# Patient Record
Sex: Male | Born: 1975 | Race: Black or African American | Hispanic: No | Marital: Single | Smoking: Former smoker
Health system: Southern US, Community
[De-identification: ages and names within clinical notes are randomized; demographics above are authoritative.]

## PROBLEM LIST (undated history)

## (undated) DIAGNOSIS — E119 Type 2 diabetes mellitus without complications: Secondary | ICD-10-CM

## (undated) DIAGNOSIS — I10 Essential (primary) hypertension: Secondary | ICD-10-CM

## (undated) DIAGNOSIS — G4730 Sleep apnea, unspecified: Secondary | ICD-10-CM

---

## 2015-08-07 ENCOUNTER — Emergency Department (HOSPITAL_COMMUNITY)
Admission: EM | Admit: 2015-08-07 | Discharge: 2015-08-07 | Disposition: A | Discharge status: Home / Self Care | Payer: BLUE CROSS/BLUE SHIELD | Attending: Emergency Medicine | Admitting: Emergency Medicine

## 2015-08-07 ENCOUNTER — Encounter (HOSPITAL_COMMUNITY): Payer: Self-pay | Admitting: Emergency Medicine

## 2015-08-07 DIAGNOSIS — K029 Dental caries, unspecified: Secondary | ICD-10-CM | POA: Insufficient documentation

## 2015-08-07 DIAGNOSIS — I10 Essential (primary) hypertension: Secondary | ICD-10-CM | POA: Insufficient documentation

## 2015-08-07 DIAGNOSIS — Z87891 Personal history of nicotine dependence: Secondary | ICD-10-CM | POA: Insufficient documentation

## 2015-08-07 DIAGNOSIS — K0889 Other specified disorders of teeth and supporting structures: Secondary | ICD-10-CM | POA: Diagnosis present

## 2015-08-07 DIAGNOSIS — K047 Periapical abscess without sinus: Secondary | ICD-10-CM | POA: Diagnosis not present

## 2015-08-07 HISTORY — DX: Essential (primary) hypertension: I10

## 2015-08-07 MED ORDER — CLINDAMYCIN HCL 300 MG PO CAPS: 300.0000 mg | ORAL_CAPSULE | Freq: Four times a day (QID) | ORAL | Status: DC

## 2015-08-07 MED ORDER — TRAMADOL HCL 50 MG PO TABS: 50.0000 mg | ORAL_TABLET | Freq: Four times a day (QID) | ORAL | Status: DC | PRN

## 2015-08-07 NOTE — ED Notes (Signed)
Pt saw dentist on Tuesday, pt was put on amoxicillin, has a plan to pull on Wednesday. Pt feels like it is not getting better

## 2015-08-07 NOTE — ED Notes (Signed)
Pt states understanding of care given and follow up instructions.  Ambulated from ED  

## 2015-08-07 NOTE — Discharge Instructions (Signed)
Dental Abscess A dental abscess is pus in or around a tooth. HOME CARE  Take medicines only as told by your dentist.  If you were prescribed antibiotic medicine, finish all of it even if you start to feel better.  Rinse your mouth (gargle) often with salt water.  Do not drive or use heavy machinery, like a lawn mower, while taking pain medicine.  Do not apply heat to the outside of your mouth.  Keep all follow-up visits as told by your dentist. This is important. GET HELP IF:  Your pain is worse, and medicine does not help. GET HELP RIGHT AWAY IF:  You have a fever or chills.  Your symptoms suddenly get worse.  You have a very bad headache.  You have problems breathing or swallowing.  You have trouble opening your mouth.  You have puffiness (swelling) in your neck or around your eye.   This information is not intended to replace advice given to you by your health care provider. Make sure you discuss any questions you have with your health care provider.   Document Released: 01/07/2015 Document Reviewed: 01/07/2015 Elsevier Interactive Patient Education 2016 [employer]  

## 2015-08-07 NOTE — ED Provider Notes (Signed)
CSN: [id number]     Arrival date & time 08/07/15  2032 History   First MD Initiated Contact with Patient 08/07/15 2046     Chief Complaint  Patient presents with  . Dental Pain     (Consider location/radiation/quality/duration/timing/severity/associated sxs/prior Treatment) HPI   James LangoMatthew Hughston is a 39 y.o. male who presents to the Emergency Department complaining of dental pain.  He states that he was seen by his dentist earlier this week and started on amoxil.  He reported continued pain and facial swelling and concerned that he has an abscess.  He reports constant pain to his left lower jaw.  Pain is worse with cold sensation and chewing.  He denies fever, chills, neck pain, difficulty swallowing.  He states that he has an appt for extraction for next week.     Past Medical History  Diagnosis Date  . Hypertension    History reviewed. No pertinent past surgical history. No family history on file. Social History  Substance Use Topics  . Smoking status: Former Games developermoker  . Smokeless tobacco: None  . Alcohol Use: No    Review of Systems  Constitutional: Negative for fever and appetite change.  HENT: Positive for dental problem and facial swelling. Negative for congestion, sore throat and trouble swallowing.   Eyes: Negative for pain and visual disturbance.  Musculoskeletal: Negative for neck pain and neck stiffness.  Neurological: Negative for dizziness, facial asymmetry and headaches.  Hematological: Negative for adenopathy.  All other systems reviewed and are negative.     Allergies  Review of patient's allergies indicates not on file.  Home Medications   Prior to Admission medications   Not on File   BP 144/75 mmHg  Pulse 95  Temp(Src) 98.1 F (36.7 C) (Oral)  Resp 15  Ht 5\' 8"  (1.727 m)  Wt 102.059 kg  BMI 34.22 kg/m2  SpO2 100% Physical Exam  Constitutional: He is oriented to person, place, and time. He appears well-developed and well-nourished. No  distress.  HENT:  Head: Normocephalic and atraumatic.  Right Ear: Tympanic membrane and ear canal normal.  Left Ear: Tympanic membrane and ear canal normal.  Mouth/Throat: Uvula is midline, oropharynx is clear and moist and mucous membranes are normal. No trismus in the jaw. Dental caries present. No dental abscesses or uvula swelling.  Mild lower left facial edema.  ttp and dental caries of the left lower first molar.  No trismus, or sublingual abnml.    Neck: Normal range of motion. Neck supple.  Cardiovascular: Normal rate, regular rhythm and normal heart sounds.   No murmur heard. Pulmonary/Chest: Effort normal and breath sounds normal. No respiratory distress.  Musculoskeletal: Normal range of motion.  Lymphadenopathy:    He has no cervical adenopathy.  Neurological: He is alert and oriented to person, place, and time. He exhibits normal muscle tone. Coordination normal.  Skin: Skin is warm and dry.  Nursing note and vitals reviewed.   ED Course  Procedures (including critical care time)   MDM   Final diagnoses:  Dental abscess    Pt has appt with a dentist next Wednesday.  Well appearing, airway patent.  No concerning sx's for Ludwig's angina. I will change pt from amoxil to clindamycin, #15 ultram for pain.  Stable for d/c    Pauline Ausammy Mars Scheaffer, PA-C 08/09/15 2049  Vanetta MuldersScott Zackowski, MD 08/13/15 [phone]

## 2015-11-11 ENCOUNTER — Emergency Department (HOSPITAL_COMMUNITY)
Admission: EM | Admit: 2015-11-11 | Discharge: 2015-11-11 | Disposition: A | Discharge status: Home / Self Care | Payer: BLUE CROSS/BLUE SHIELD | Attending: Emergency Medicine | Admitting: Emergency Medicine

## 2015-11-11 ENCOUNTER — Encounter (HOSPITAL_COMMUNITY): Payer: Self-pay | Admitting: *Deleted

## 2015-11-11 DIAGNOSIS — J0100 Acute maxillary sinusitis, unspecified: Secondary | ICD-10-CM | POA: Insufficient documentation

## 2015-11-11 DIAGNOSIS — Z79899 Other long term (current) drug therapy: Secondary | ICD-10-CM | POA: Insufficient documentation

## 2015-11-11 DIAGNOSIS — I10 Essential (primary) hypertension: Secondary | ICD-10-CM | POA: Diagnosis not present

## 2015-11-11 DIAGNOSIS — Z87891 Personal history of nicotine dependence: Secondary | ICD-10-CM | POA: Diagnosis not present

## 2015-11-11 DIAGNOSIS — R51 Headache: Secondary | ICD-10-CM | POA: Diagnosis present

## 2015-11-11 MED ORDER — AZITHROMYCIN 250 MG PO TABS: 250.0000 mg | ORAL_TABLET | Freq: Every day | ORAL | Status: DC

## 2015-11-11 MED ORDER — TRAMADOL HCL 50 MG PO TABS: 50.0000 mg | ORAL_TABLET | Freq: Four times a day (QID) | ORAL | Status: DC | PRN

## 2015-11-11 MED ORDER — PREDNISONE 20 MG PO TABS: 60.0000 mg | ORAL_TABLET | Freq: Every day | ORAL | Status: DC

## 2015-11-11 NOTE — ED Notes (Signed)
Pt c/o headache and right ear pain x 2 weeks

## 2015-11-11 NOTE — Discharge Instructions (Signed)

## 2015-11-11 NOTE — ED Provider Notes (Addendum)
CSN: [id number]     Arrival date & time 11/11/15  0406 History   None    No chief complaint on file.    (Consider location/radiation/quality/duration/timing/severity/associated sxs/prior Treatment) HPI Comments: Patient presents to the emergency department for evaluation of facial pain and sinus pain. Patient reports symptoms have been ongoing for 2 weeks. He has been taking over-the-counter medication without improvement. He does report that he has had problems with his sinuses in the past. He has had significant nasal congestion and intermittent bleeding of his nose. Pain is all on the right side of his face, has not identified any toothaches or dental problems.   Past Medical History  Diagnosis Date  . Hypertension    No past surgical history on file. No family history on file. Social History  Substance Use Topics  . Smoking status: Former Games developermoker  . Smokeless tobacco: Not on file  . Alcohol Use: No    Review of Systems  HENT: Positive for congestion.   All other systems reviewed and are negative.     Allergies  Review of patient's allergies indicates not on file.  Home Medications   Prior to Admission medications   Medication Sig Start Date End Date Taking? Authorizing Provider  clindamycin (CLEOCIN) 300 MG capsule Take 1 capsule (300 mg total) by mouth 4 (four) times daily. For 7 days 08/07/15   Tammy Triplett, PA-C  traMADol (ULTRAM) 50 MG tablet Take 1 tablet (50 mg total) by mouth every 6 (six) hours as needed. 08/07/15   Tammy Triplett, PA-C   There were no vitals taken for this visit. Physical Exam  Constitutional: He is oriented to person, place, and time. He appears well-developed and well-nourished. No distress.  HENT:  Head: Normocephalic and atraumatic.  Right Ear: Hearing, tympanic membrane and external ear normal.  Left Ear: Hearing normal.  Nose: Mucosal edema present. Right sinus exhibits maxillary sinus tenderness.  Mouth/Throat: Oropharynx is clear  and moist and mucous membranes are normal. No dental abscesses or dental caries. No oropharyngeal exudate, posterior oropharyngeal erythema or tonsillar abscesses.  Eyes: Conjunctivae and EOM are normal. Pupils are equal, round, and reactive to light.  Neck: Normal range of motion. Neck supple.  Cardiovascular: Regular rhythm, S1 normal and S2 normal.  Exam reveals no gallop and no friction rub.   No murmur heard. Pulmonary/Chest: Effort normal and breath sounds normal. No respiratory distress. He exhibits no tenderness.  Abdominal: Soft. Normal appearance and bowel sounds are normal. There is no hepatosplenomegaly. There is no tenderness. There is no rebound, no guarding, no tenderness at McBurney's point and negative Murphy's sign. No hernia.  Musculoskeletal: Normal range of motion.  Neurological: He is alert and oriented to person, place, and time. He has normal strength. No cranial nerve deficit or sensory deficit. Coordination normal. GCS eye subscore is 4. GCS verbal subscore is 5. GCS motor subscore is 6.  Skin: Skin is warm, dry and intact. No rash noted. No cyanosis.  Psychiatric: He has a normal mood and affect. His speech is normal and behavior is normal. Thought content normal.  Nursing note and vitals reviewed.   ED Course  Procedures (including critical care time) Labs Review Labs Reviewed - No data to display  Imaging Review No results found. I have personally reviewed and evaluated these images and lab results as part of my medical decision-making.   EKG Interpretation None      MDM   Final diagnoses:  None  Sinusitis  Gilda Crease, MD 11/11/15 4098  Gilda Crease, MD 11/11/15 [phone]

## 2015-11-11 NOTE — ED Notes (Signed)
Pt states understanding of care given and follow up instructions.  Ambulated from ED  

## 2020-09-25 ENCOUNTER — Other Ambulatory Visit: Payer: Self-pay

## 2020-09-25 ENCOUNTER — Encounter (HOSPITAL_COMMUNITY): Payer: Self-pay | Admitting: *Deleted

## 2020-09-25 ENCOUNTER — Emergency Department (HOSPITAL_COMMUNITY)
Admission: EM | Admit: 2020-09-25 | Discharge: 2020-09-25 | Disposition: A | Discharge status: Home / Self Care | Payer: BLUE CROSS/BLUE SHIELD | Attending: Emergency Medicine | Admitting: Emergency Medicine

## 2020-09-25 DIAGNOSIS — Z5321 Procedure and treatment not carried out due to patient leaving prior to being seen by health care provider: Secondary | ICD-10-CM | POA: Insufficient documentation

## 2020-09-25 DIAGNOSIS — R251 Tremor, unspecified: Secondary | ICD-10-CM | POA: Insufficient documentation

## 2020-09-25 DIAGNOSIS — K5900 Constipation, unspecified: Secondary | ICD-10-CM | POA: Insufficient documentation

## 2020-09-25 DIAGNOSIS — R059 Cough, unspecified: Secondary | ICD-10-CM | POA: Insufficient documentation

## 2020-09-25 NOTE — ED Notes (Signed)
Left at 1605 prior to treament

## 2020-09-25 NOTE — ED Triage Notes (Signed)
covid exposure, cough, constipation, tremor for months

## 2021-02-17 ENCOUNTER — Encounter (HOSPITAL_COMMUNITY): Payer: Self-pay

## 2021-02-17 ENCOUNTER — Other Ambulatory Visit: Payer: Self-pay

## 2021-02-17 ENCOUNTER — Emergency Department (HOSPITAL_COMMUNITY): Payer: Self-pay

## 2021-02-17 ENCOUNTER — Emergency Department (HOSPITAL_COMMUNITY)
Admission: EM | Admit: 2021-02-17 | Discharge: 2021-02-17 | Disposition: A | Discharge status: Home / Self Care | Payer: Self-pay | Attending: Emergency Medicine | Admitting: Emergency Medicine

## 2021-02-17 DIAGNOSIS — Z87891 Personal history of nicotine dependence: Secondary | ICD-10-CM | POA: Insufficient documentation

## 2021-02-17 DIAGNOSIS — I10 Essential (primary) hypertension: Secondary | ICD-10-CM | POA: Insufficient documentation

## 2021-02-17 DIAGNOSIS — M79604 Pain in right leg: Secondary | ICD-10-CM | POA: Insufficient documentation

## 2021-02-17 DIAGNOSIS — Z79899 Other long term (current) drug therapy: Secondary | ICD-10-CM | POA: Insufficient documentation

## 2021-02-17 DIAGNOSIS — G589 Mononeuropathy, unspecified: Secondary | ICD-10-CM | POA: Insufficient documentation

## 2021-02-17 DIAGNOSIS — G588 Other specified mononeuropathies: Secondary | ICD-10-CM

## 2021-02-17 LAB — CBC WITH DIFFERENTIAL/PLATELET
Abs Immature Granulocytes: 0.01 10*3/uL (ref 0.00–0.07)
Basophils Absolute: 0 10*3/uL (ref 0.0–0.1)
Basophils Relative: 1 %
Eosinophils Absolute: 0.3 10*3/uL (ref 0.0–0.5)
Eosinophils Relative: 9 %
HCT: 39.1 % (ref 39.0–52.0)
Hemoglobin: 12.8 g/dL — ABNORMAL LOW (ref 13.0–17.0)
Immature Granulocytes: 0 %
Lymphocytes Relative: 23 %
Lymphs Abs: 0.9 10*3/uL (ref 0.7–4.0)
MCH: 29.6 pg (ref 26.0–34.0)
MCHC: 32.7 g/dL (ref 30.0–36.0)
MCV: 90.3 fL (ref 80.0–100.0)
Monocytes Absolute: 0.4 10*3/uL (ref 0.1–1.0)
Monocytes Relative: 10 %
Neutro Abs: 2.3 10*3/uL (ref 1.7–7.7)
Neutrophils Relative %: 57 %
Platelets: 218 10*3/uL (ref 150–400)
RBC: 4.33 MIL/uL (ref 4.22–5.81)
RDW: 13.5 % (ref 11.5–15.5)
WBC: 3.9 10*3/uL — ABNORMAL LOW (ref 4.0–10.5)
nRBC: 0 % (ref 0.0–0.2)

## 2021-02-17 LAB — COMPREHENSIVE METABOLIC PANEL
ALT: 13 U/L (ref 0–44)
AST: 15 U/L (ref 15–41)
Albumin: 3.6 g/dL (ref 3.5–5.0)
Alkaline Phosphatase: 86 U/L (ref 38–126)
Anion gap: 6 (ref 5–15)
BUN: 17 mg/dL (ref 6–20)
CO2: 28 mmol/L (ref 22–32)
Calcium: 8.8 mg/dL — ABNORMAL LOW (ref 8.9–10.3)
Chloride: 106 mmol/L (ref 98–111)
Creatinine, Ser: 0.95 mg/dL (ref 0.61–1.24)
GFR, Estimated: >60 mL/min (ref >60)
Glucose, Bld: 100 mg/dL — ABNORMAL HIGH (ref 70–99)
Potassium: 3.5 mmol/L (ref 3.5–5.1)
Sodium: 140 mmol/L (ref 135–145)
Total Bilirubin: 0.9 mg/dL (ref 0.3–1.2)
Total Protein: 7.2 g/dL (ref 6.5–8.1)

## 2021-02-17 IMAGING — DX DG KNEE COMPLETE 4+V*R*
4 series · 4 of 4 positions shown · non-contrast
Comparison: None.

CLINICAL DATA: Right knee pain.  No known injury.

EXAM:
RIGHT KNEE - COMPLETE 4+ VIEW

[knee ap]
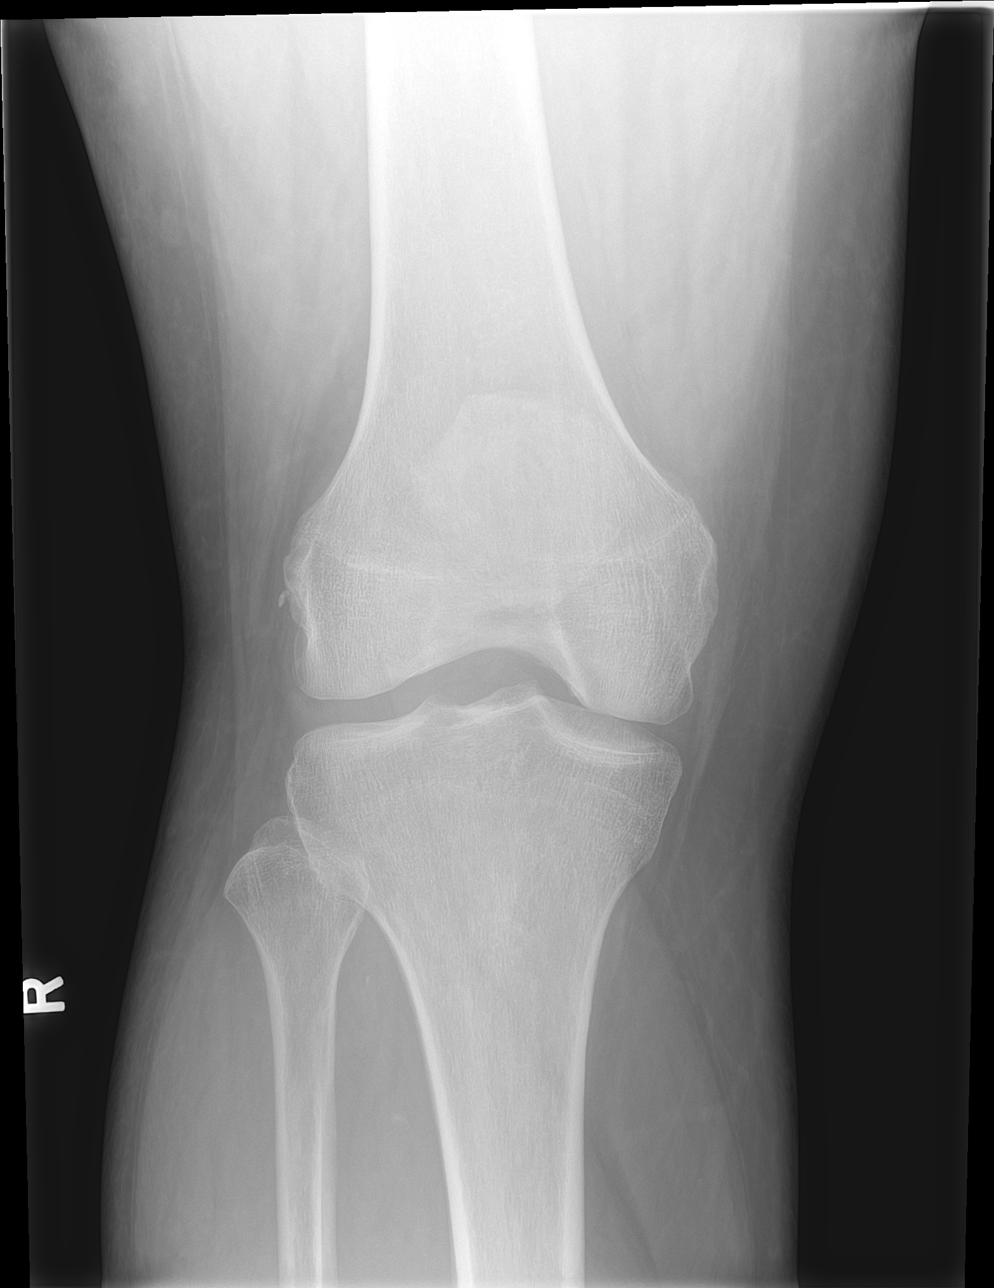

[knee lat]
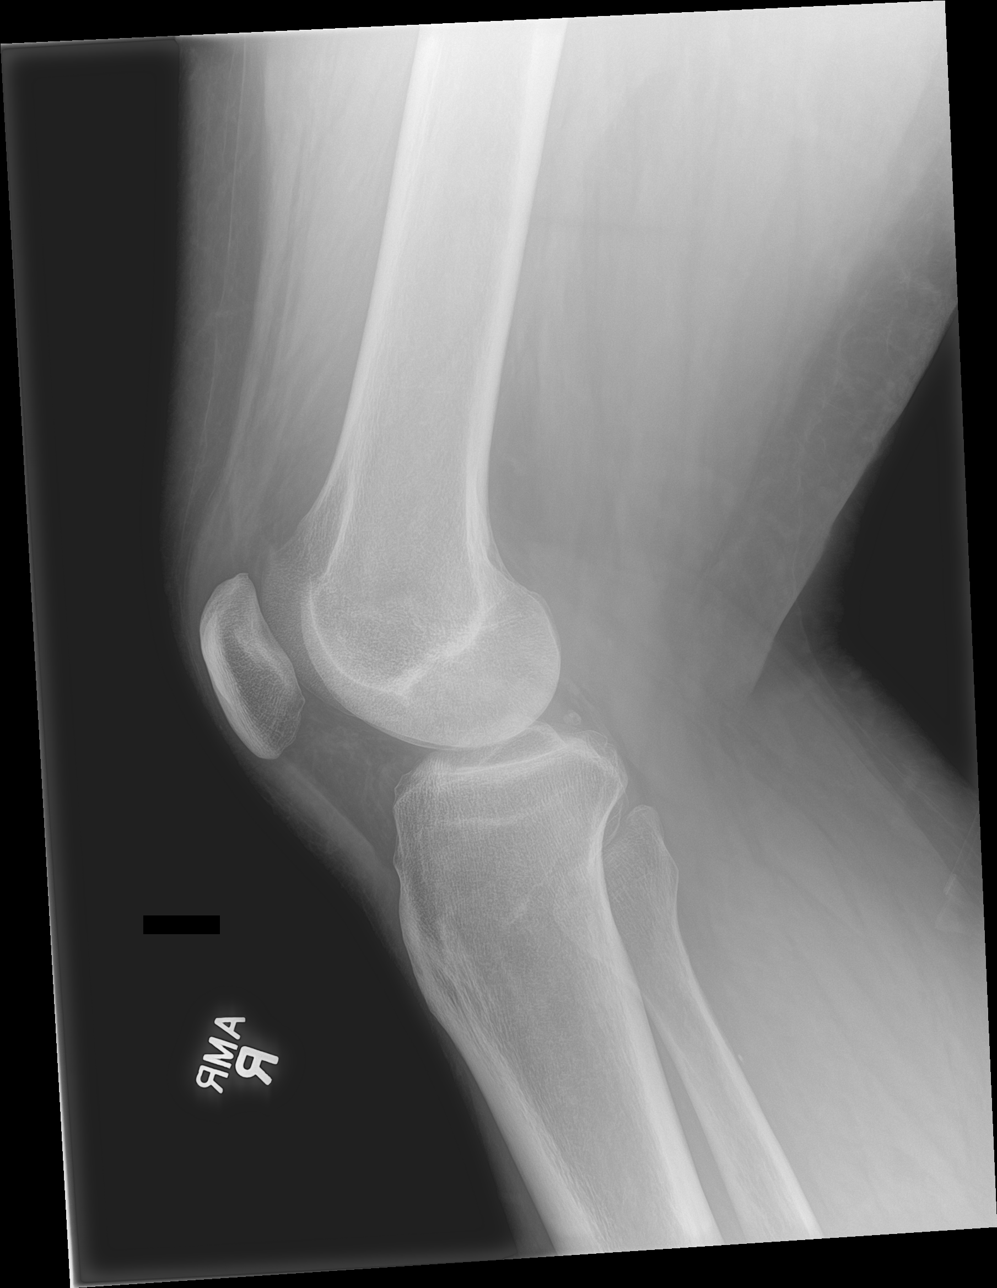

[knee obl (1 of 2)]
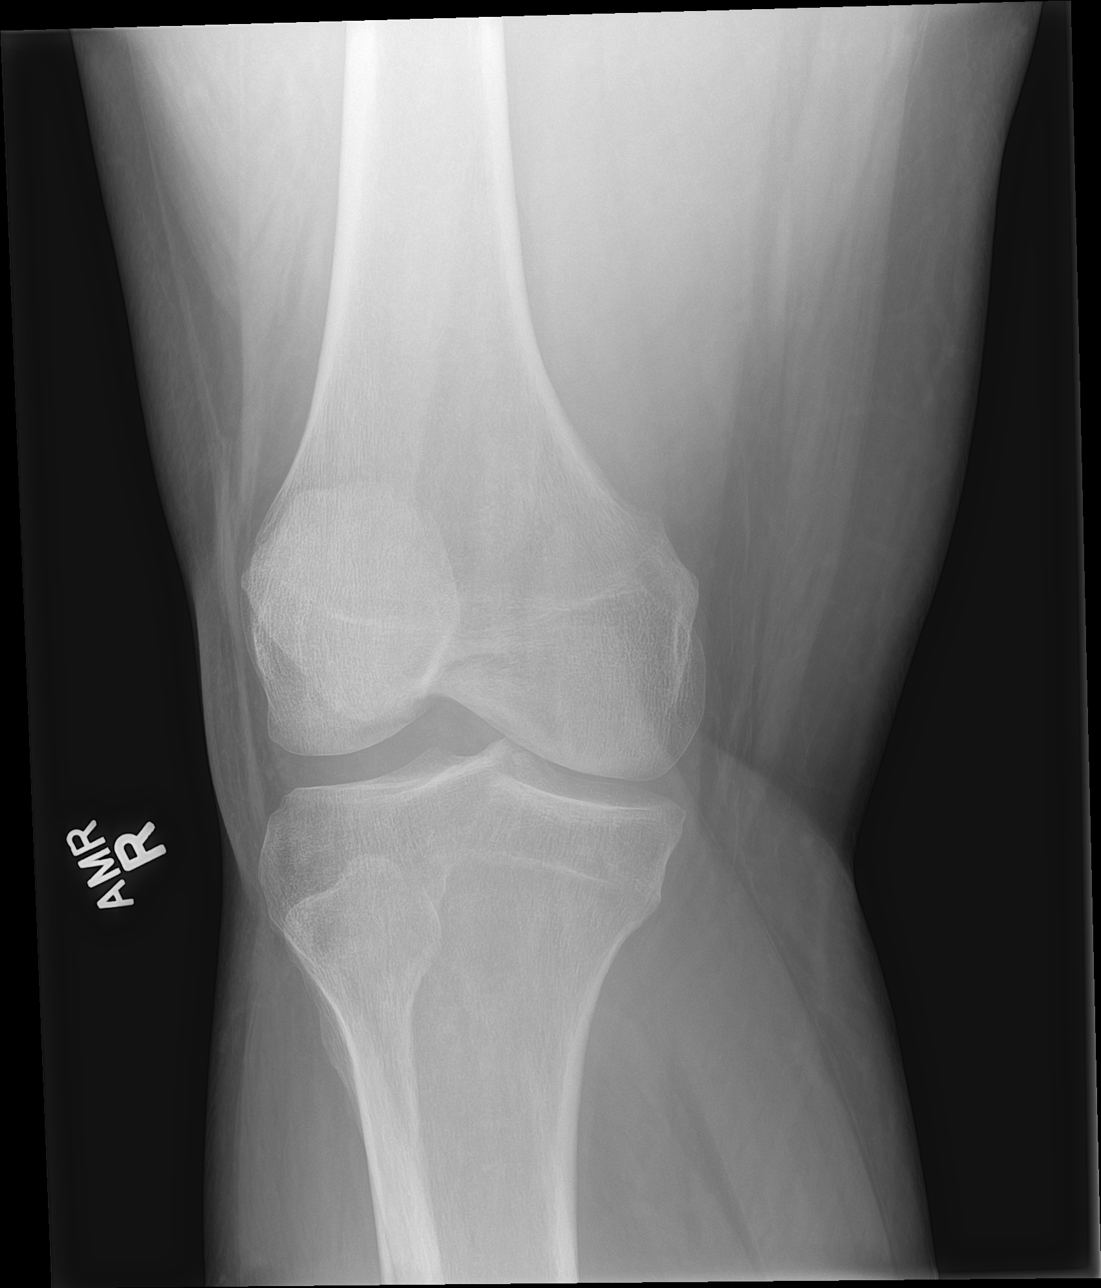

[knee obl (2 of 2)]
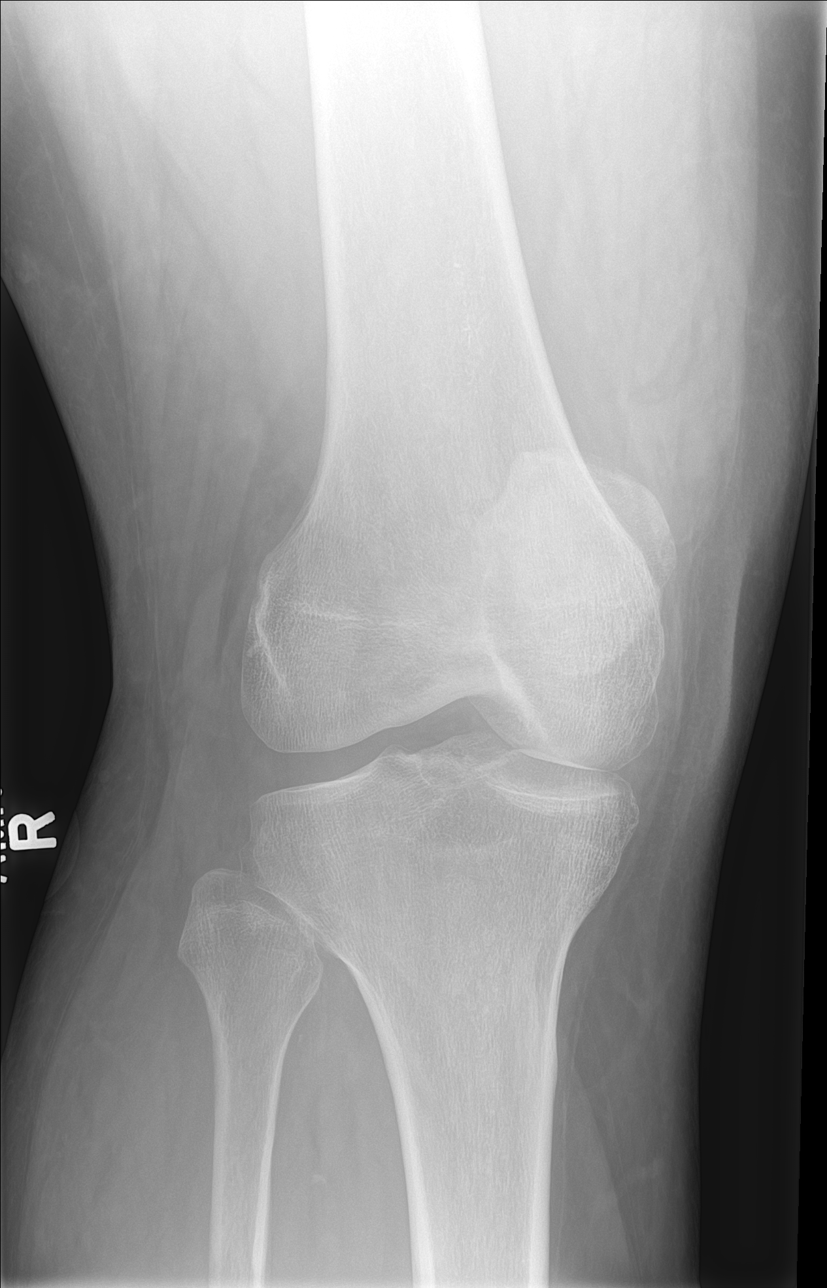

[4 of 4 positions shown; findings below may reference images not displayed]

FINDINGS: No evidence of fracture, dislocation, or joint effusion. Joint
spaces are maintained. No evidence of arthropathy or other focal
bone abnormality. Soft tissues are unremarkable.
IMPRESSION: Negative.

## 2021-02-17 MED ORDER — AMLODIPINE BESYLATE 10 MG PO TABS: 10.0000 mg | ORAL_TABLET | Freq: Every day | ORAL | 0 refills | Status: DC

## 2021-02-17 MED ORDER — ACETAMINOPHEN 500 MG PO TABS
1000.0000 mg | ORAL_TABLET | Freq: Once | ORAL | Status: AC
  Administered 2021-02-17: 1000 mg via ORAL
  Filled 2021-02-17: qty 2

## 2021-02-17 NOTE — Discharge Instructions (Addendum)
Start taking blood pressure medications and follow-up closely with primary doctor. Discuss outpatient MRI if your symptoms continue.  Return for incontinence, weakness in 1 or both of your legs, loss of sensation around your rectal and testicle area, fevers or new concerns. Take Tylenol every 4 hours as needed for pain.

## 2021-02-17 NOTE — ED Provider Notes (Signed)
[facility] EMERGENCY DEPARTMENT Provider Note   CSN: [id number] Arrival date & time: 02/17/21  1008     History Chief Complaint  Patient presents with   Leg Pain    James Hartman is a 45 y.o. male.  Patient presents with right leg pain for 3 weeks.  Patient is also had uncontrolled blood pressure, used to be on blood pressure medications however no longer takes.  Patient does have a primary doctor said he could not get in.  No fevers or chills.  No injuries recalled.  Intermittent pain from anterior right thigh down into foot.  No shortness of breath or chest pain.  Component worse with movement and walking.  No back pain with it.  No IV drug use history of back surgery history.  No incontinence or urinary changes, patient normally has small amounts of urination.  No weakness in the legs.      Past Medical History:  Diagnosis Date   Hypertension     There are no problems to display for this patient.   History reviewed. No pertinent surgical history.     No family history on file.  Social History   Tobacco Use   Smoking status: Former    Pack years: 0.00   Smokeless tobacco: Never  Substance Use Topics   Alcohol use: Not Currently   Drug use: No    Home Medications Prior to Admission medications   Medication Sig Start Date End Date Taking? Authorizing Provider  amLODipine (NORVASC) 10 MG tablet Take 1 tablet (10 mg total) by mouth daily. 02/17/21  Yes Blane Ohara, MD  ibuprofen (ADVIL) 200 MG tablet Take 400 mg by mouth every 6 (six) hours as needed for mild pain.   Yes [provider]  azithromycin (ZITHROMAX) 250 MG tablet Take 1 tablet (250 mg total) by mouth daily. Take first 2 tablets together, then 1 every day until finished. Patient not taking: Reported on 02/17/2021 11/11/15   Gilda Crease, MD  predniSONE (DELTASONE) 20 MG tablet Take 3 tablets (60 mg total) by mouth daily with breakfast. Patient not taking: Reported on 02/17/2021  11/11/15   Gilda Crease, MD  traMADol (ULTRAM) 50 MG tablet Take 1 tablet (50 mg total) by mouth every 6 (six) hours as needed. Patient not taking: Reported on 02/17/2021 11/11/15   Gilda Crease, MD    Allergies    Patient has no known allergies.  Review of Systems   Review of Systems  Constitutional:  Negative for chills and fever.  HENT:  Negative for congestion.   Eyes:  Negative for visual disturbance.  Respiratory:  Negative for shortness of breath.   Cardiovascular:  Negative for chest pain and leg swelling.  Gastrointestinal:  Negative for abdominal pain and vomiting.  Genitourinary:  Positive for difficulty urinating. Negative for dysuria and flank pain.  Musculoskeletal:  Negative for back pain, neck pain and neck stiffness.  Skin:  Negative for rash.  Neurological:  Negative for weakness, light-headedness and headaches.   Physical Exam Updated Vital Signs BP (!) 148/92   Pulse 69   Temp (!) 97.4 F (36.3 C) (Oral)   Resp 16   Ht 5\' 7"  (1.702 m)   Wt 127 kg   SpO2 99%   BMI 43.85 kg/m   Physical Exam Vitals and nursing note reviewed.  Constitutional:      General: He is not in acute distress.    Appearance: He is well-developed.  HENT:  Head: Normocephalic and atraumatic.     Mouth/Throat:     Mouth: Mucous membranes are dry.  Eyes:     General:        Right eye: No discharge.        Left eye: No discharge.     Conjunctiva/sclera: Conjunctivae normal.  Neck:     Trachea: No tracheal deviation.  Cardiovascular:     Rate and Rhythm: Normal rate and regular rhythm.     Heart sounds: No murmur heard. Pulmonary:     Effort: Pulmonary effort is normal.     Breath sounds: Normal breath sounds.  Abdominal:     General: There is no distension.     Palpations: Abdomen is soft.     Tenderness: There is no abdominal tenderness. There is no guarding.  Musculoskeletal:        General: No swelling or tenderness.     Cervical back: Normal range  of motion and neck supple.     Comments: Patient points to location of pain anterior quadricep region on the right extending leg.  On exam patient has normal sensation in all major nerves to palpation bilateral lower extremity.  2+ pulses in distal extremities bilateral.  No swelling to the legs.  5+ strength flexion extension of major joints and lower extremities.  No back tenderness on palpation lumbar region.  Skin:    General: Skin is warm.     Capillary Refill: Capillary refill takes less than 2 seconds.     Findings: No rash.  Neurological:     General: No focal deficit present.     Mental Status: He is alert and oriented to person, place, and time.     Cranial Nerves: No cranial nerve deficit.     Motor: No weakness.  Psychiatric:        Mood and Affect: Mood normal.    ED Results / Procedures / Treatments   Labs (all labs ordered are listed, but only abnormal results are displayed) Labs Reviewed  CBC WITH DIFFERENTIAL/PLATELET - Abnormal; Notable for the following components:      Result Value   WBC 3.9 (*)    Hemoglobin 12.8 (*)    All other components within normal limits  COMPREHENSIVE METABOLIC PANEL - Abnormal; Notable for the following components:   Glucose, Bld 100 (*)    Calcium 8.8 (*)    All other components within normal limits  URINALYSIS, ROUTINE W REFLEX MICROSCOPIC    EKG None  Radiology DG Knee Complete 4 Views Right  Result Date: 02/17/2021 CLINICAL DATA:  Right knee pain.  No known injury. EXAM: RIGHT KNEE - COMPLETE 4+ VIEW COMPARISON:  None. FINDINGS: No evidence of fracture, dislocation, or joint effusion. Joint spaces are maintained. No evidence of arthropathy or other focal bone abnormality. Soft tissues are unremarkable. IMPRESSION: Negative. Electronically Signed   By: Duanne Guess D.O.   On: 02/17/2021 11:01    Procedures Procedures   Medications Ordered in ED Medications  acetaminophen (TYLENOL) tablet 1,000 mg (1,000 mg Oral Given  02/17/21 1055)    ED Course  I have reviewed the triage vital signs and the nursing notes.  Pertinent labs & imaging results that were available during my care of the patient were reviewed by me and considered in my medical decision making (see chart for details).    MDM Rules/Calculators/A&P  Patient presents with multiple concerns for the past 3 weeks.  Recommended the importance of following up with primary doctor to ensure his signs and symptoms improve, blood pressure improved as significant risks with uncontrolled blood pressure.  Strongly recommend starting back on his medication will give prescription for 30 days.  Patient has no isolated neurodeficits, no signs of cauda equina at this time.  Patient stable for outpatient MRI.  X-ray of the right knee area reviewed no acute fracture findings.  Tylenol given for pain.  Blood pressure improved in the ER and prescription given for home.  General blood work ordered and reviewed showing majority of electrolytes unremarkable mild low calcium 8.8, no significant anemia, no elevated white blood cell count, normal kidney function.  Patient stable for continued outpatient follow-up.  Final Clinical Impression(s) / ED Diagnoses Final diagnoses:  Right leg pain  Other mononeuropathy  Primary hypertension    Rx / DC Orders ED Discharge Orders          Ordered    amLODipine (NORVASC) 10 MG tablet  Daily        02/17/21 1333             Blane Ohara, MD 02/17/21 1337

## 2021-02-17 NOTE — ED Triage Notes (Signed)
Pt c/o right leg ,thigh pain  down to his foot for a few weeks.

## 2021-02-23 ENCOUNTER — Other Ambulatory Visit: Payer: Self-pay

## 2021-02-23 ENCOUNTER — Encounter: Payer: Self-pay | Admitting: Neurology

## 2021-02-23 DIAGNOSIS — R202 Paresthesia of skin: Secondary | ICD-10-CM

## 2021-04-07 ENCOUNTER — Other Ambulatory Visit: Payer: Self-pay

## 2021-04-07 ENCOUNTER — Ambulatory Visit (INDEPENDENT_AMBULATORY_CARE_PROVIDER_SITE_OTHER): Payer: Self-pay | Admitting: Neurology

## 2021-04-07 DIAGNOSIS — M5417 Radiculopathy, lumbosacral region: Secondary | ICD-10-CM

## 2021-04-07 DIAGNOSIS — R202 Paresthesia of skin: Secondary | ICD-10-CM

## 2021-04-07 NOTE — Procedures (Signed)
[facility]  [address] [place], [address]  [place], [place] [address] Tel: [phone] Fax:  [phone] Test Date:  04/07/2021  Patient: James Hartman, James Hartman. DOB: 1976-02-27 Physician: Nita Sickle, DO  Sex: Male Height: 5\' 7"  Ref Phys: , MD  ID#: [id number]   Technician:    Patient Complaints: This is a 45 year old man referred for evaluation of right leg pain and numbness  NCV & EMG Findings: Extensive electrodiagnostic testing of the right lower extremity shows:  Right sural and superficial peroneal sensory responses are within normal limits. Right peroneal and tibial motor responses are within normal limits. Chronic motor axonal loss changes are seen affecting the L5 myotome on the right, without accompanied active denervation.  Impression: Chronic L5 radiculopathy affecting the right lower extremity, mild. There is no evidence of a large fiber sensorimotor polyneuropathy affecting the right lower extremity.   ___________________________ 55, DO    Nerve Conduction Studies Anti Sensory Summary Table   Stim Site NR Peak (ms) Norm Peak (ms) P-T Amp (V) Norm P-T Amp  Right Sup Peroneal Anti Sensory (Ant Lat Mall)  32C  12 cm    2.6 <4.5 14.8 >5  Right Sural Anti Sensory (Lat Mall)  32C  Calf    2.7 <4.5 15.3 >5   Motor Summary Table   Stim Site NR Onset (ms) Norm Onset (ms) O-P Amp (mV) Norm O-P Amp Site1 Site2 Delta-0 (ms) Dist (cm) Vel (m/s) Norm Vel (m/s)  Right Peroneal Motor (Ext Dig Brev)  32C  Ankle    4.0 <5.5 6.7 >3 B Fib Ankle 9.8 41.0 42 >40  B Fib    13.8  6.1  Poplt B Fib 1.6 9.0 56 >40  Poplt    15.4  6.0         Right Tibial Motor (Abd Hall Brev)  32C  Ankle    5.9 <6.0 8.2 >8 Knee Ankle 10.3 42.0 41 >40  Knee    16.2  5.8          EMG   Side Muscle Ins Act Fibs Psw Fasc Number Recrt Dur Dur. Amp Amp. Poly Poly. Comment  Right AntTibialis Nml Nml Nml Nml 1- Rapid Few 1+ Few 1+ Nml Nml N/A  Right Gastroc  Nml Nml Nml Nml Nml Nml Nml Nml Nml Nml Nml Nml N/A  Right Flex Dig Long Nml Nml Nml Nml 1- Rapid Few 1+ Few 1+ Nml Nml N/A  Right RectFemoris Nml Nml Nml Nml Nml Nml Nml Nml Nml Nml Nml Nml N/A  Right GluteusMed Nml Nml Nml Nml 1- Rapid Few 1+ Few 1+ Nml Nml N/A      Waveforms:

## 2021-05-25 ENCOUNTER — Ambulatory Visit (HOSPITAL_COMMUNITY): Payer: Medicaid Other | Attending: Family Medicine

## 2021-05-25 ENCOUNTER — Other Ambulatory Visit: Payer: Self-pay

## 2021-05-25 DIAGNOSIS — M6281 Muscle weakness (generalized): Secondary | ICD-10-CM | POA: Diagnosis present

## 2021-05-25 DIAGNOSIS — R262 Difficulty in walking, not elsewhere classified: Secondary | ICD-10-CM

## 2021-05-25 DIAGNOSIS — R29898 Other symptoms and signs involving the musculoskeletal system: Secondary | ICD-10-CM
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
## 2021-06-09 ENCOUNTER — Ambulatory Visit (HOSPITAL_COMMUNITY): Payer: Medicaid Other | Attending: Family Medicine

## 2021-06-09 ENCOUNTER — Telehealth (HOSPITAL_COMMUNITY): Payer: Self-pay

## 2021-06-09 DIAGNOSIS — M6281 Muscle weakness (generalized): Secondary | ICD-10-CM | POA: Insufficient documentation

## 2021-06-09 DIAGNOSIS — R29898 Other symptoms and signs involving the musculoskeletal system: Secondary | ICD-10-CM | POA: Insufficient documentation

## 2021-06-09 DIAGNOSIS — R262 Difficulty in walking, not elsewhere classified: Secondary | ICD-10-CM | POA: Insufficient documentation

## 2021-06-09 NOTE — Telephone Encounter (Signed)
No show #1.  Attempted to call with inability to reach or leave message.  Letter placed in mail for reminder of next apt and contact number if needs to cancel/rescheduled future apts.  Becky Sax, LPTA/CLT; [place] [phone]

## 2021-06-16 ENCOUNTER — Ambulatory Visit (HOSPITAL_COMMUNITY): Payer: Medicaid Other

## 2021-06-16 ENCOUNTER — Other Ambulatory Visit: Payer: Self-pay

## 2021-06-16 DIAGNOSIS — R29898 Other symptoms and signs involving the musculoskeletal system: Secondary | ICD-10-CM

## 2021-06-16 DIAGNOSIS — M6281 Muscle weakness (generalized): Secondary | ICD-10-CM | POA: Diagnosis present

## 2021-06-16 DIAGNOSIS — R262 Difficulty in walking, not elsewhere classified: Secondary | ICD-10-CM

## 2021-06-16 NOTE — Therapy (Signed)
[place], [place], [address] Phone: [phone]   Fax:  [phone]  Physical Therapy Treatment and Discharge Summary  Patient Details  Name: James Hartman MRN: [id number] Date of Birth: May 09, 1976 Referring Provider (PT): Curtis Sites MD  PHYSICAL THERAPY DISCHARGE SUMMARY  Visits from Start of Care: 2  Current functional level related to goals / functional outcomes: No change in symptoms, reports sensory disturbance surrounding his genitals, anus, anterior BLE, to medial feet   Remaining deficits: See assessment   Education / Equipment: Advised to seek MD assessment/diagnositics   Patient agrees to discharge. Patient goals were not met. Patient is being discharged due to a change in medical status.  Encounter Date: 06/16/2021   PT End of Session - 06/16/21 0944     Visit Number 2    Number of Visits 4    Authorization Type [place] Medicaid [employer], 27 VL, no auth for over 50 yo    Authorization - Visit Number 2    Authorization - Number of Visits 27    PT Start Time 0945    PT Stop Time 1020    PT Time Calculation (min) 35 min    Activity Tolerance Patient tolerated treatment well    Behavior During Therapy WFL for tasks assessed/performed             Past Medical History:  Diagnosis Date   Hypertension     No past surgical history on file.  There were no vitals filed for this visit.   Subjective Assessment - 06/16/21 0947     Subjective Pt notes no change in his symptoms and reports MD has increased doseage of gabapentin. Pt reports that he has been experiencing saddle anesthesia and notes urinary urgency but denies loss of bowel control.  Pt reports numbess extending from medial side of his feet extending along anterior thighs to groin. Pt reports issue of erectile dysfunction and again notes numbess surrounding his genitals and anus. Pt denies any serious back discomfort  and reports symptoms do not change based on activity or position    Diagnostic tests Nerve conduction test on RLE    Currently in Pain? Yes    Pain Score 4     Pain Location Leg    Pain Orientation Right                [facility] PT Assessment - 06/16/21 0001       Assessment   Medical Diagnosis Radiculopathy Of Leg    Referring Provider (PT) Curtis Sites MD      Strength   Strength Assessment Site Knee;Ankle    Right/Left Knee Right    Right Knee Flexion 4/5    Right Knee Extension 4/5    Right/Left Ankle Right    Right Ankle Dorsiflexion 4/5    Right Ankle Plantar Flexion 5/5    Right Ankle Inversion 5/5    Right Ankle Eversion 5/5      Special Tests    Special Tests Lumbar    Lumbar Tests Straight Leg Raise;Prone Knee Bend Test;Slump Test      Slump test   Findings Negative      Prone Knee Bend Test   Findings Negative      Straight Leg Raise   Findings Negative                           [facility] Adult PT Treatment/Exercise -  06/16/21 0001       Exercises   Exercises Lumbar      Lumbar Exercises: Stretches   Prone on Elbows Stretch 5 reps;60 seconds                     PT Education - 06/16/21 1013     Education Details discussion with pt regarding his present condition and report of symptoms.  This therapist recommends to patient that he seek further medical diagnostics and assessment given his current presentation and report of sensory disturbance surrounding his genitals    Person(s) Educated Patient    Methods Explanation    Comprehension Verbalized understanding              PT Short Term Goals - 06/16/21 1019       PT SHORT TERM GOAL #1   Title Patient will be independent with HEP in order to improve functional outcomes.    Time 2    Period Weeks    Status Deferred    Target Date 06/08/21      PT SHORT TERM GOAL #2   Title Patient will report at least 25% improvement in symptoms for improved quality of  life.    Baseline 3/10 pain in BLE at rest    Time 2    Period Weeks    Status Deferred    Target Date 06/08/21               PT Long Term Goals - 06/16/21 1019       PT LONG TERM GOAL #1   Title Patient will be able to ambulate at least 250 feet in 2MWT in order to demonstrate improved gait speed for community ambulation.    Baseline 200 ft with SPC    Time 4    Period Weeks    Status Deferred      PT LONG TERM GOAL #2   Title Patient will be able to navigate stairs with reciprocal pattern without compensation in order to demonstrate improved LE strength.    Baseline step-to, bilateral HR    Time 4    Period Weeks    Status Deferred                   Plan - 06/16/21 1016     Clinical Impression Statement No change in symptoms with body mechanics, position, time of day, or activity level.  Pt notes numbess along his genitals, anus, anterior thighs, extending to medial feet bilaterally.  No change in symptoms with flexion or extension bias of spine and denies any serious back pain other than discomfort to palpation around lumbosacral region.  Slight weakness appreciated in RLE vs LLE. Unable to elicit Achilles reflex bilaterally.  Given his report of sensory disturbance and report of genitourinary issues I have asked him to follow-up with referring MD for further assessment    Personal Factors and Comorbidities Time since onset of injury/illness/exacerbation    Examination-Activity Limitations Bend;Carry;Lift;Stand;Stairs;Locomotion Level    Examination-Participation Restrictions Church;Cleaning;Yard Work;Occupation    Stability/Clinical Decision Making Stable/Uncomplicated    Rehab Potential Fair    PT Frequency 1x / week    PT Duration 4 weeks    PT Treatment/Interventions Electrical Stimulation;Traction;Moist Heat;Gait training;Stair training;Functional mobility training;Therapeutic activities;Therapeutic exercise;Balance training;Patient/family education;Dry  needling;Neuromuscular re-education;Energy conservation;Manual techniques;Taping;Spinal Manipulations;Joint Manipulations    PT Next Visit Plan extension vs flexion bias    PT Home Exercise Plan prone, prone on elbows, prone press-ups  Consulted and Agree with Plan of Care Patient             Patient will benefit from skilled therapeutic intervention in order to improve the following deficits and impairments:  Abnormal gait, Decreased activity tolerance, Decreased balance, Decreased strength, Difficulty walking, Impaired sensation, Postural dysfunction, Pain  Visit Diagnosis: Difficulty in walking, not elsewhere classified  Other symptoms and signs involving the musculoskeletal system  Muscle weakness (generalized)     Problem List There are no problems to display for this patient.   Toniann Fail, PT 06/16/2021, 10:22 AM  [place] [address] [place], [place], [address] Phone: [phone]   Fax:  [phone]  Name: James Hartman MRN: [id number] Date of Birth: [birth year]

## 2021-06-23 ENCOUNTER — Encounter (HOSPITAL_COMMUNITY): Payer: Medicaid Other

## 2021-06-30 ENCOUNTER — Encounter (HOSPITAL_COMMUNITY): Payer: Medicaid Other

## 2021-09-17 ENCOUNTER — Other Ambulatory Visit (HOSPITAL_COMMUNITY): Payer: Self-pay | Admitting: Family Medicine

## 2021-09-17 ENCOUNTER — Other Ambulatory Visit: Payer: Self-pay | Admitting: Family Medicine

## 2021-09-17 DIAGNOSIS — R200 Anesthesia of skin: Secondary | ICD-10-CM

## 2021-09-29 ENCOUNTER — Other Ambulatory Visit: Payer: Self-pay

## 2021-09-29 ENCOUNTER — Ambulatory Visit (HOSPITAL_COMMUNITY)
Admission: RE | Admit: 2021-09-29 | Discharge: 2021-09-29 | Disposition: A | Discharge status: Home / Self Care | Payer: Medicaid Other | Source: Ambulatory Visit | Attending: Family Medicine | Admitting: Family Medicine

## 2021-09-29 DIAGNOSIS — R200 Anesthesia of skin: Secondary | ICD-10-CM | POA: Diagnosis present

## 2021-09-29 IMAGING — MR MR LUMBAR SPINE W/O CM
5 series · 32 of 48 positions shown · non-contrast
Comparison: None.

CLINICAL DATA: Low back pain with bilateral radiculopathy for 1
year

EXAM:
MRI LUMBAR SPINE WITHOUT CONTRAST
TECHNIQUE: Multiplanar, multisequence MR imaging of the lumbar spine was
performed. No intravenous contrast was administered.

[Series 5: T2 · sagittal · 4.0mm · 0.88mm/px · 6 of 13 slices shown (1 of 2)]
[im 1/13]
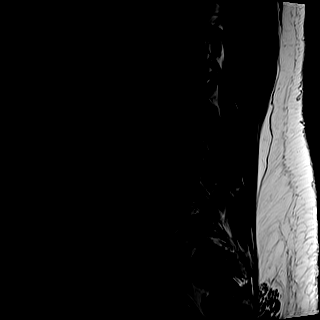
[im 3/13]
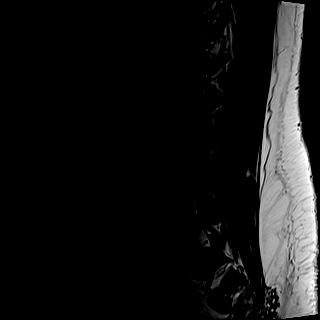
[im 5/13]
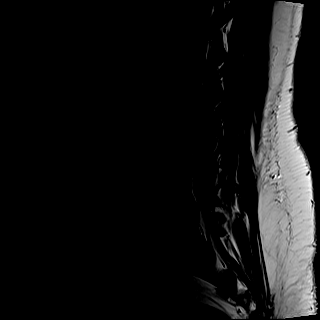
[im 8/13]
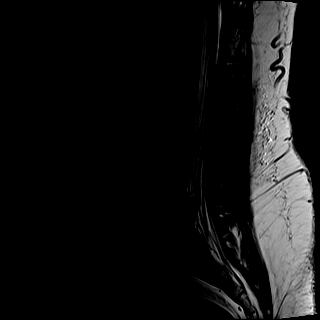
[im 10/13]
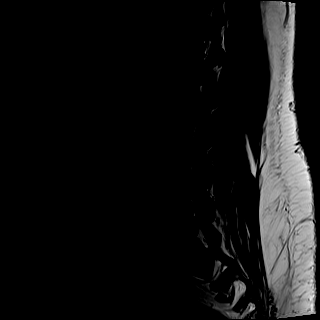
[im 13/13]
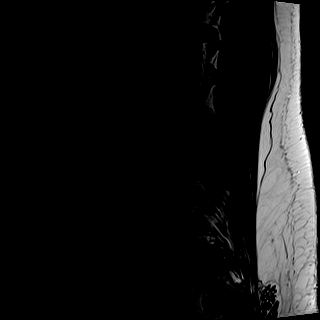

[Series 6: T1 · sagittal · 4.0mm · 1.02mm/px · 6 of 13 slices shown (1 of 2)]
[im 1/13]
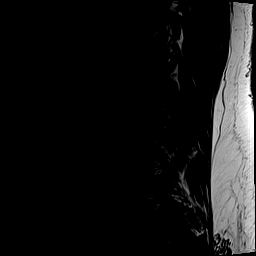
[im 3/13]
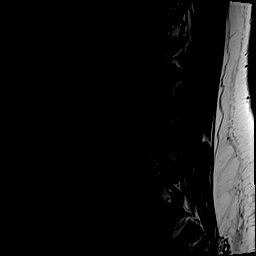
[im 5/13]
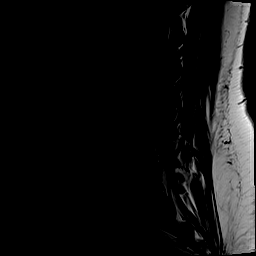
[im 8/13]
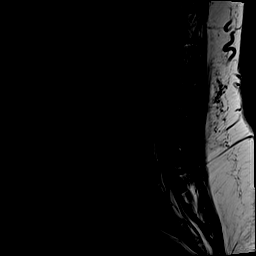
[im 10/13]
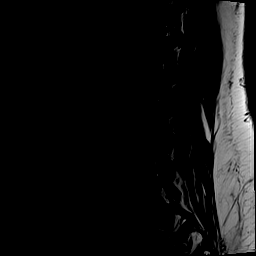
[im 13/13]
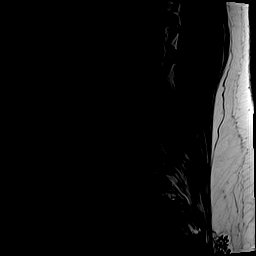

[Series 7: STIR · sagittal · 4.0mm · 0.51mm/px · 2 of 13 slices shown]
[im 1/13]
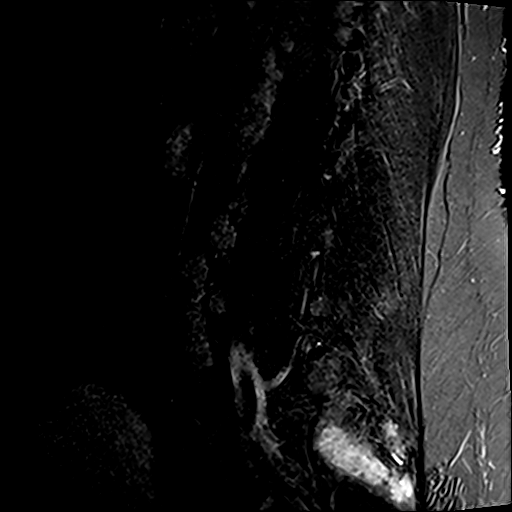
[im 3/13]
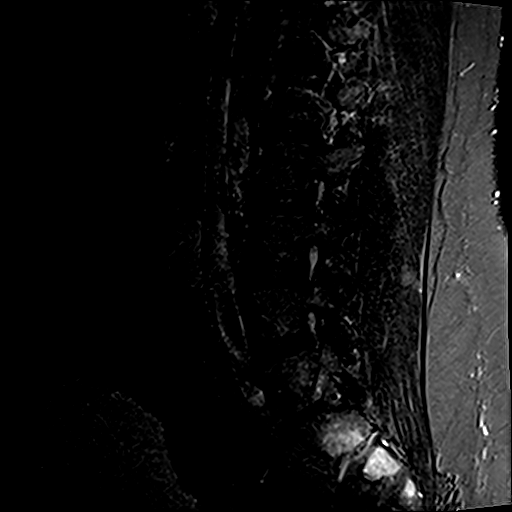

[Series 8: T2 · axial · 4.0mm · 0.70mm/px · z∈[-51,+174]mm · 9 of 35 slices shown (2 of 2)]
[im 1/35]
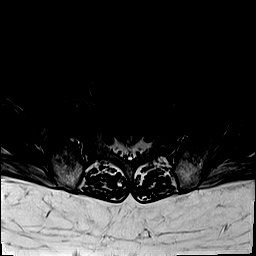
[im 5/35]
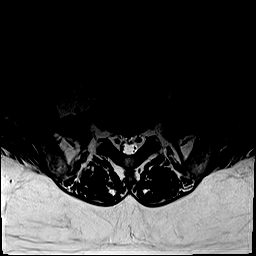
[im 10/35]
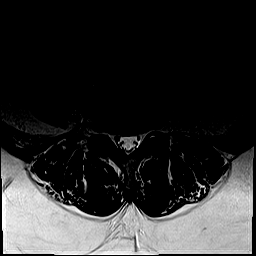
[im 15/35]
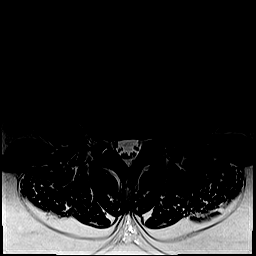
[im 18/35]
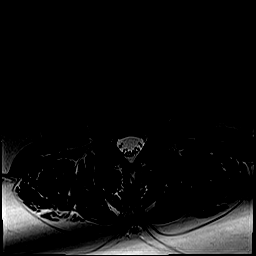
[im 20/35]
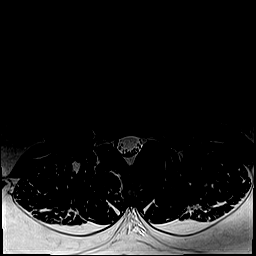
[im 25/35]
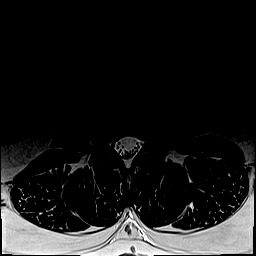
[im 30/35]
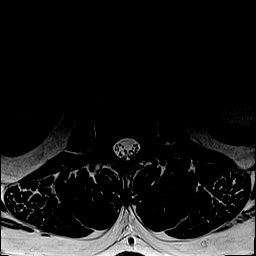
[im 35/35]
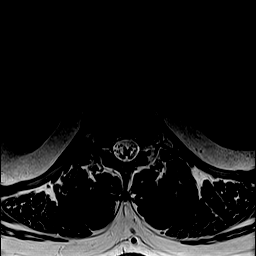

[Series 9: T1 · axial · 4.0mm · 0.35mm/px · z∈[-51,+174]mm · 9 of 35 slices shown (2 of 2)]
[im 1/35]
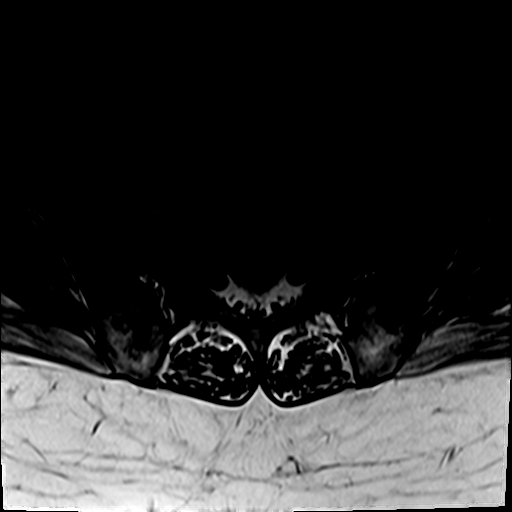
[im 5/35]
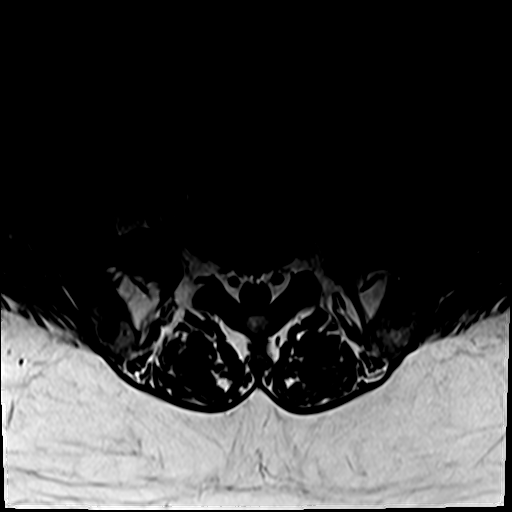
[im 10/35]
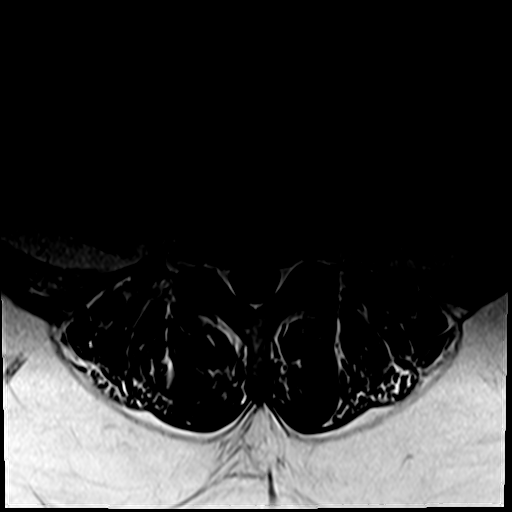
[im 15/35]
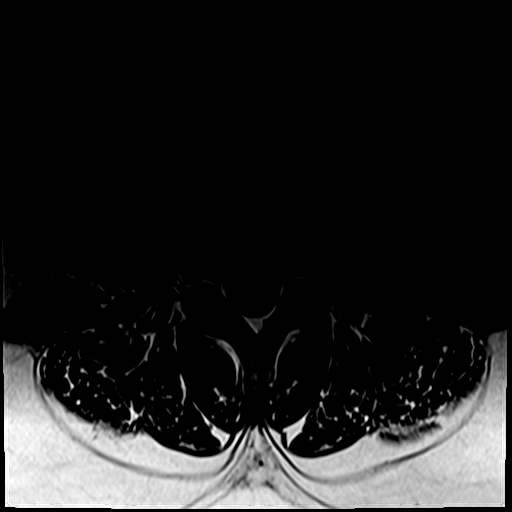
[im 18/35]
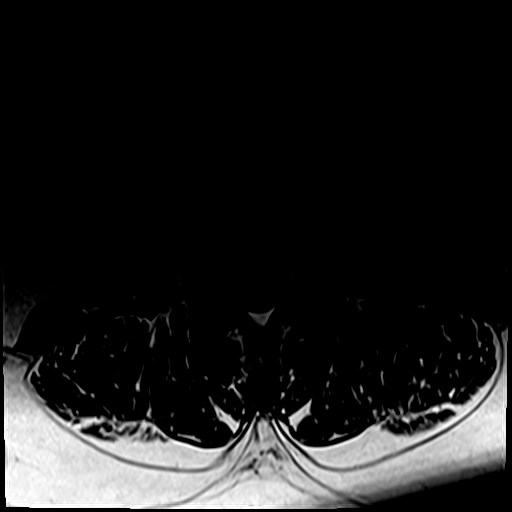
[im 20/35]
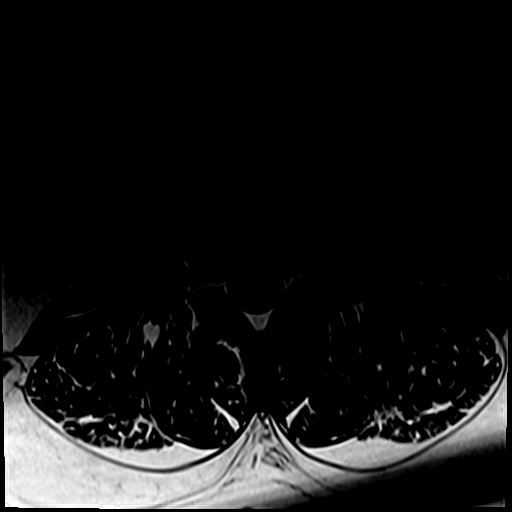
[im 25/35]
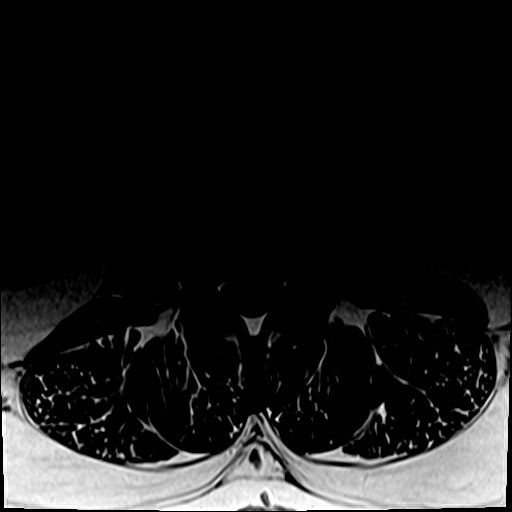
[im 30/35]
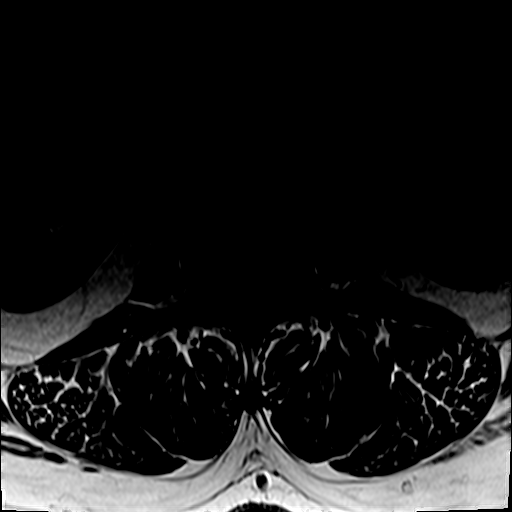
[im 35/35]
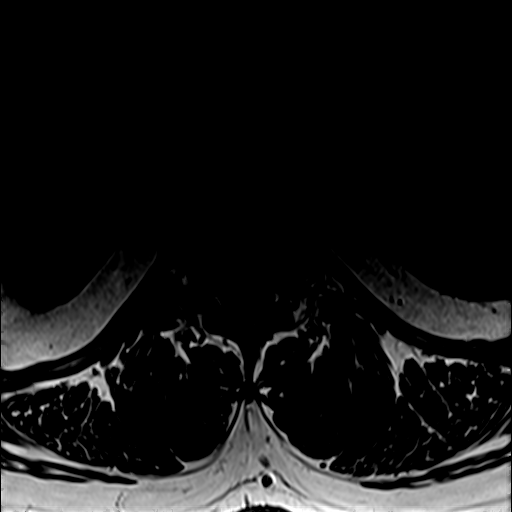

[32 of 48 positions shown; findings below may reference images not displayed]

FINDINGS: Segmentation:  Standard.

Alignment:  Physiologic.

Vertebrae: Vertebral body heights are maintained without fracture.
No evidence of discitis. No marrow replacing bone lesion. Bone
marrow edema within the bilateral sacral ala without well-defined
fracture line on the included sequences.

Conus medullaris and cauda equina: Conus extends to the T12-L1
level. Conus and cauda equina appear normal.

Paraspinal and other soft tissues: Multiple enlarged retroperitoneal
lymph nodes. Large cyst within the left kidney.

Disc levels:

T12-L1: Unremarkable.

L1-L2: Unremarkable.

L2-L3: Unremarkable.

L3-L4: Unremarkable.

L4-L5: Mild annular disc bulge. Minimal bilateral facet arthropathy.
No canal stenosis. No significant foraminal stenosis.

L5-S1: Unremarkable.
IMPRESSION: 1. Bone marrow edema within the bilateral sacral ala without
well-defined fracture line on the included sequences. Findings could
represent sacral insufficiency fractures. A marrow infiltrative
process is not excluded. Further evaluation with MRI of the pelvis
without and with IV contrast is recommended.
2. No significant disc disease of the lumbar spine. No canal or
foraminal stenosis at any level.
3. Multiple enlarged retroperitoneal lymph nodes, which may be
reactive, represent a lymphoproliferative process or metastatic
disease. Further hematologic workup including contrast-enhanced CT
of the abdomen and pelvis is recommended.

These results will be called to the ordering clinician or
representative by the Radiologist Assistant, and communication
documented in the PACS or [REDACTED].

## 2021-10-05 ENCOUNTER — Other Ambulatory Visit (HOSPITAL_COMMUNITY): Payer: Self-pay | Admitting: Family Medicine

## 2021-10-05 ENCOUNTER — Other Ambulatory Visit: Payer: Self-pay | Admitting: Family Medicine

## 2021-10-05 ENCOUNTER — Encounter: Payer: Self-pay | Admitting: Internal Medicine

## 2021-10-05 DIAGNOSIS — R937 Abnormal findings on diagnostic imaging of other parts of musculoskeletal system: Secondary | ICD-10-CM

## 2021-10-05 DIAGNOSIS — R591 Generalized enlarged lymph nodes: Secondary | ICD-10-CM

## 2021-10-09 ENCOUNTER — Ambulatory Visit (HOSPITAL_COMMUNITY)
Admission: RE | Admit: 2021-10-09 | Discharge: 2021-10-09 | Disposition: A | Discharge status: Home / Self Care | Payer: Medicaid Other | Source: Ambulatory Visit | Attending: Family Medicine | Admitting: Family Medicine

## 2021-10-09 ENCOUNTER — Other Ambulatory Visit: Payer: Self-pay

## 2021-10-09 DIAGNOSIS — R937 Abnormal findings on diagnostic imaging of other parts of musculoskeletal system: Secondary | ICD-10-CM | POA: Insufficient documentation

## 2021-10-09 IMAGING — MR MR PELVIS WO/W CM
5 of 9 series · 22 of 48 positions shown · IV contrast (gadavist)
Comparison: Lumbar spine MRI [DATE]

CLINICAL DATA: Marrow edema in the bilateral sacral ala on recent
lumbar spine MRI. Concern for infiltrative marrow process.

EXAM:
MRI PELVIS WITHOUT AND WITH CONTRAST
TECHNIQUE: Multiplanar multisequence MR imaging of the pelvis was performed
both before and after administration of intravenous contrast.
CONTRAST:  10mL GADAVIST GADOBUTROL 1 MMOL/ML IV SOLN

[Series 2: T1 · axial · 4.0mm · 0.49mm/px · z∈[-42,+91]mm · 5 of 30 slices shown (1 of 2)]
[im 1/30]
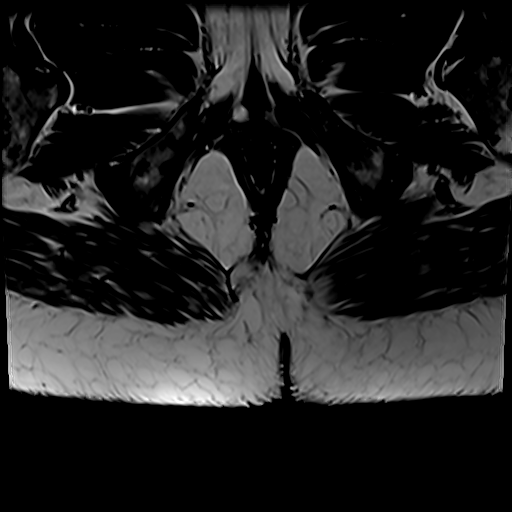
[im 8/30]
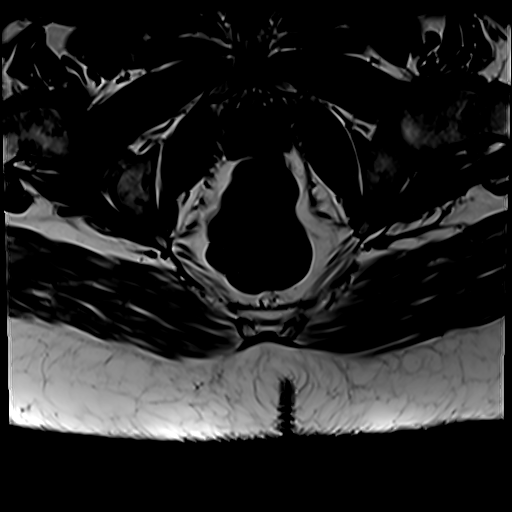
[im 15/30]
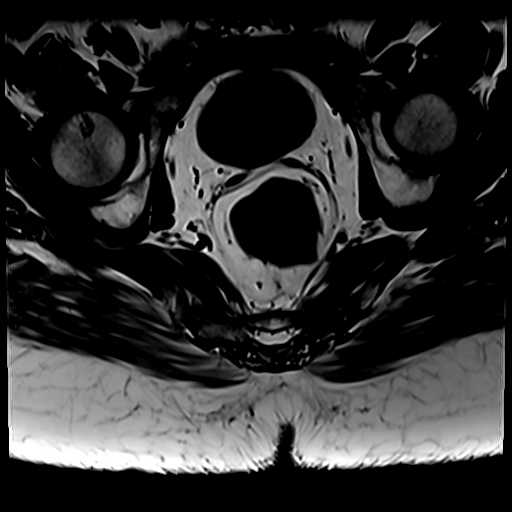
[im 22/30]
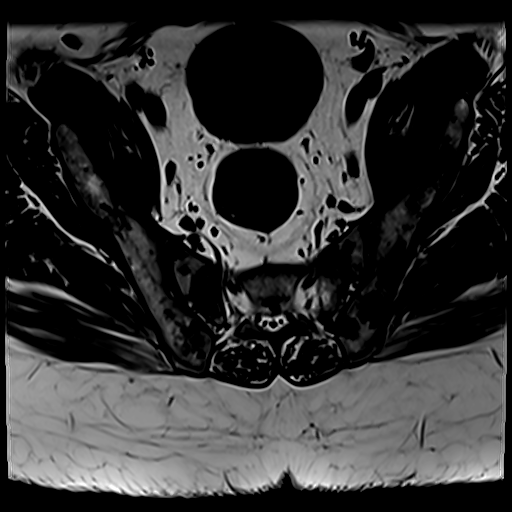
[im 30/30]
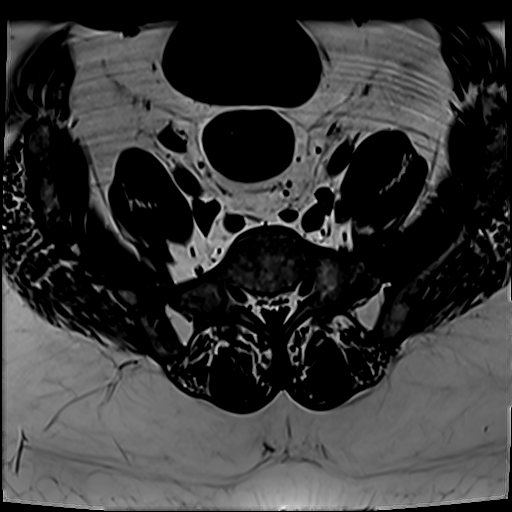

[Series 3: T2 fat-sat · axial · 4.0mm · 0.65mm/px · z∈[-42,+91]mm · 5 of 30 slices shown (1 of 2)]
[im 1/30]
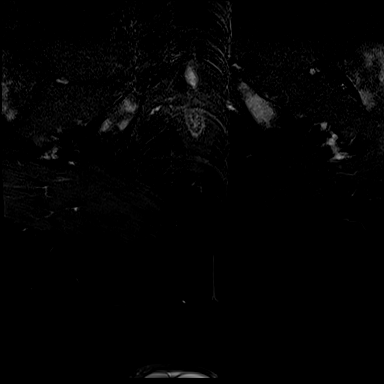
[im 8/30]
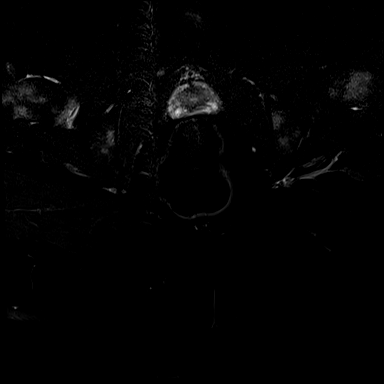
[im 15/30]
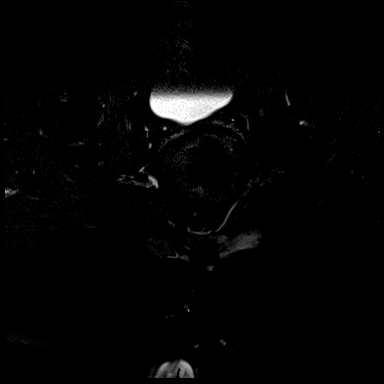
[im 22/30]
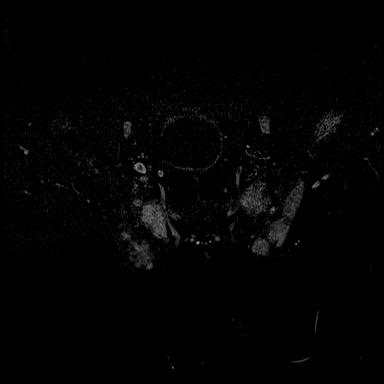
[im 30/30]
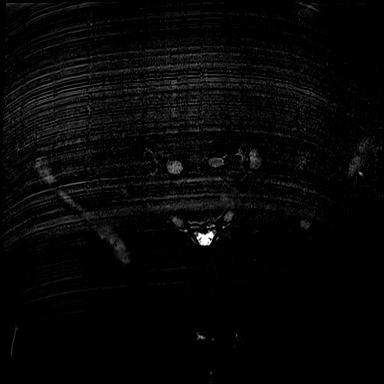

[Series 4: T2 fat-sat · sagittal · 4.0mm · 0.88mm/px · 5 of 33 slices shown (2 of 2)]
[im 1/33]
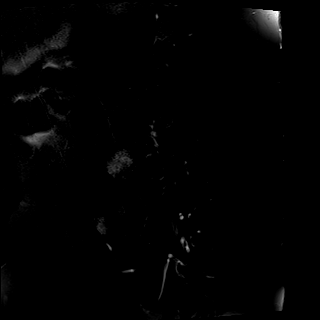
[im 9/33]
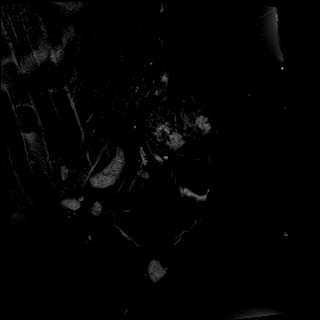
[im 17/33]
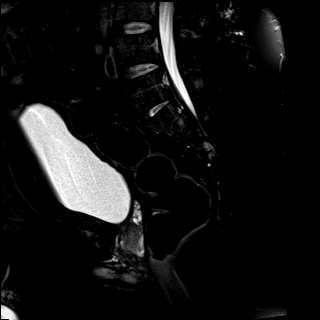
[im 25/33]
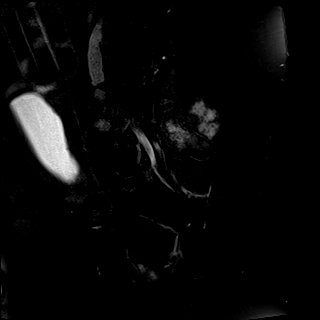
[im 33/33]
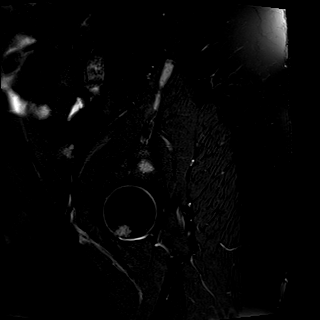

[Series 5: T1 · coronal · 3.0mm · 0.49mm/px · 6 of 35 slices shown (2 of 2)]
[im 1/35]
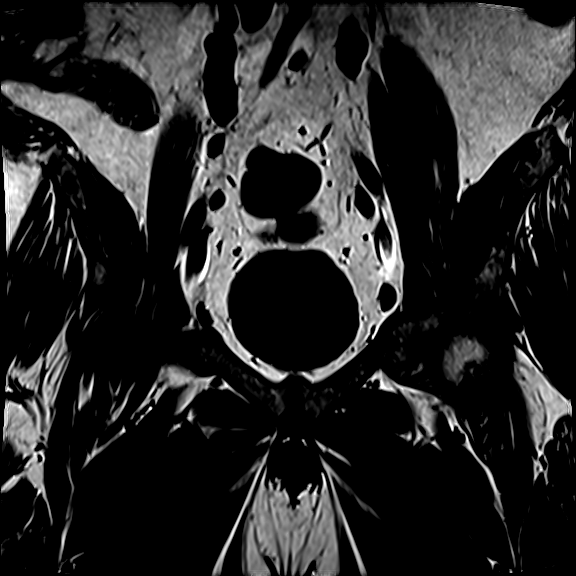
[im 7/35]
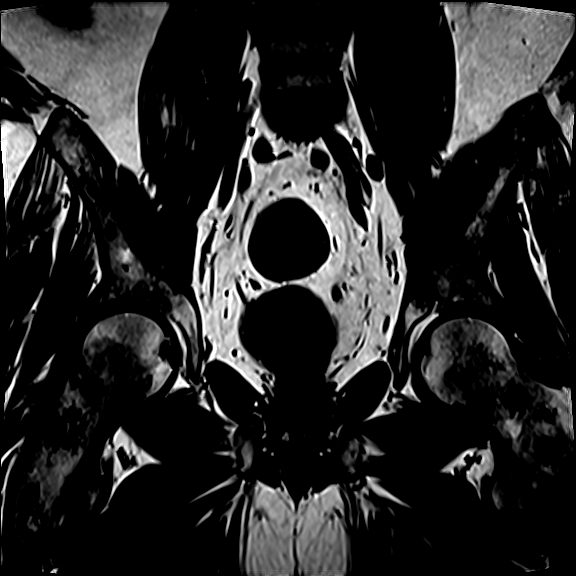
[im 14/35]
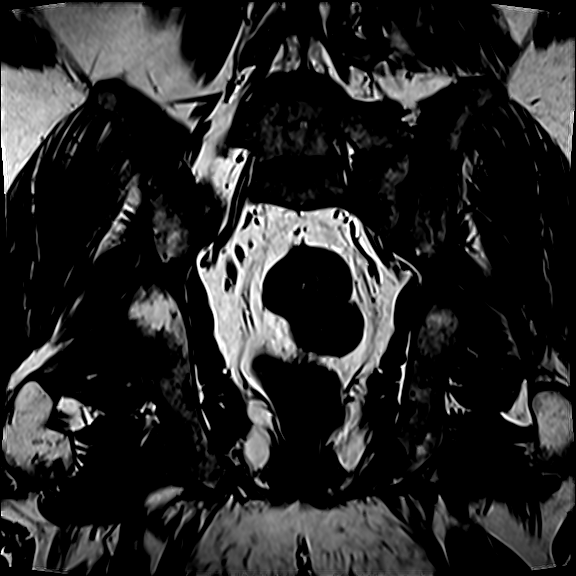
[im 21/35]
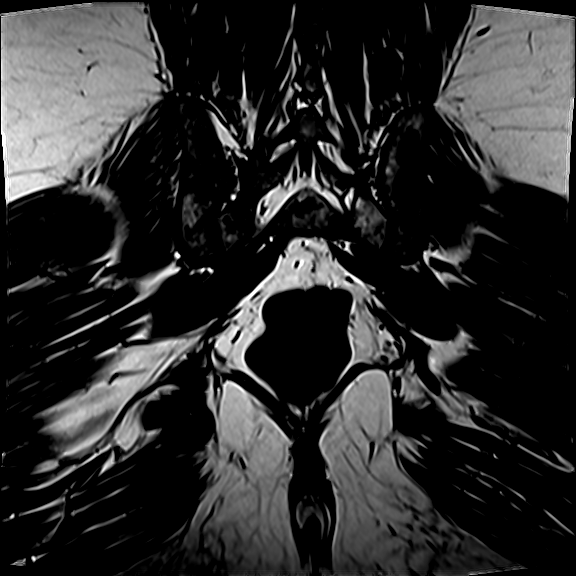
[im 28/35]
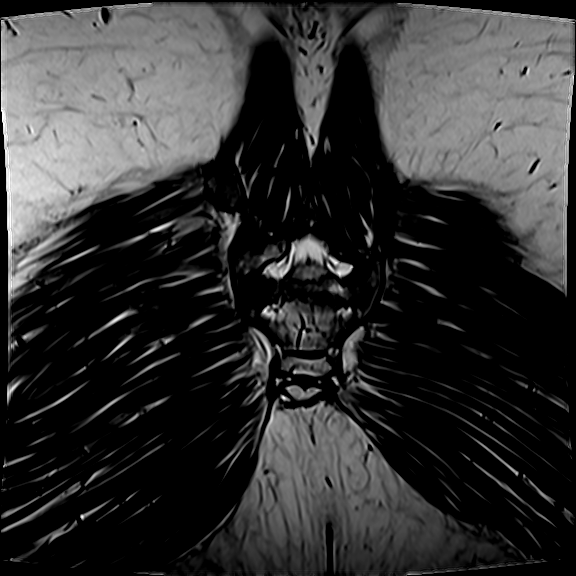
[im 35/35]
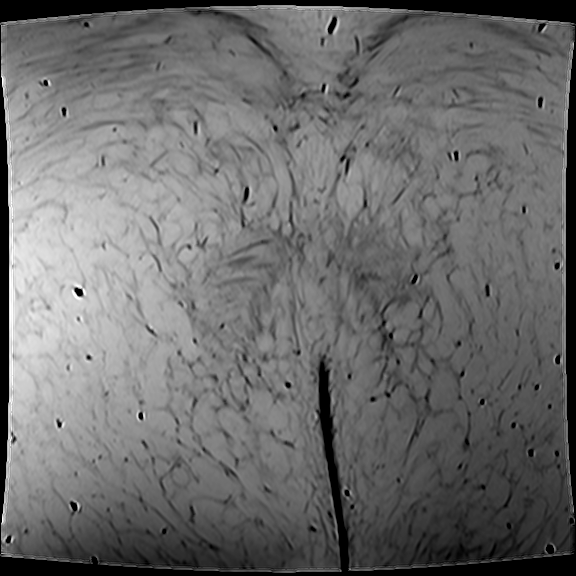

[Series 6: STIR · coronal · 3.0mm · 0.55mm/px · 1 of 35 slices shown]
[im 1/35]
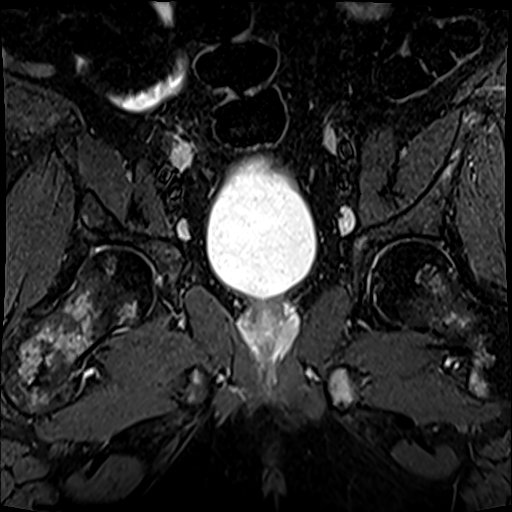

[22 of 48 positions shown; findings below may reference images not displayed]

FINDINGS: Urinary Tract:  No abnormality visualized.

Bowel:  Unremarkable visualized pelvic bowel loops.

Vascular/Lymphatic: There are enlarged external iliac lymph nodes,
measuring up to 1.2 cm on the right and 1.3 cm on left (axial T1
images 12 and 14). Partially visualized enlarged retroperitoneal
lymph nodes, as seen on recent lumbar spine MRI.

Reproductive:  Unremarkable

Other:  None

Musculoskeletal: There are multiple T2 hyperintense/T1 hypointense
enhancing lesions throughout the sacrum/pelvis and proximal femurs
proximal femurs. There also tiny lesions in the L4 and L5 vertebral
bodies and spinous process of L5. These lesions have irregular
margins and internal heterogeneity. There is no evidence of acute
fracture. There is no significant SI joint arthritis or evidence of
sacroiliitis.
IMPRESSION: Multiple T2 hyperintense/T1 hypointense enhancing lesions throughout
the pelvis and proximal femurs, and tiny lesions in the L4 and L5
vertebral bodies and L5 spinous process. Enlarged retroperitoneal
and bilateral external iliac lymph nodes. Findings are highly
suspicious for lymphoproliferative process or metastatic disease.
Recommend malignancy workup with hematology/oncology referral and
likely CT of the chest, abdomen, and pelvis.

These results were called by telephone on [DATE] at [DATE] to
provider LINDON, RN, who verbally acknowledged these
results. Per discussion, this information will be relayed to the
ordering provider.

## 2021-10-09 MED ORDER — GADOBUTROL 1 MMOL/ML IV SOLN
10.0000 mL | Freq: Once | INTRAVENOUS | Status: AC | PRN
  Administered 2021-10-09: 10 mL via INTRAVENOUS

## 2021-10-19 ENCOUNTER — Ambulatory Visit (HOSPITAL_COMMUNITY)
Admission: RE | Admit: 2021-10-19 | Discharge: 2021-10-19 | Disposition: A | Discharge status: Home / Self Care | Payer: Medicaid Other | Source: Ambulatory Visit | Attending: Family Medicine | Admitting: Family Medicine

## 2021-10-19 ENCOUNTER — Other Ambulatory Visit: Payer: Self-pay

## 2021-10-19 ENCOUNTER — Encounter (HOSPITAL_COMMUNITY): Payer: Self-pay | Admitting: Radiology

## 2021-10-19 DIAGNOSIS — R591 Generalized enlarged lymph nodes: Secondary | ICD-10-CM | POA: Insufficient documentation

## 2021-10-19 LAB — POCT I-STAT CREATININE: Creatinine, Ser: 1.1 mg/dL (ref 0.61–1.24)

## 2021-10-19 IMAGING — CT CT CHEST-ABD-PELV W/ CM
2 of 5 series · 13 of 36 positions shown, 15 images · IV contrast (Omnipaque or Isovue)
Comparison: MRI examinations from [DATE] and [DATE]

CLINICAL DATA: Adenopathy with multifocal areas of marrow edema and
enhancement in the bony pelvis and proximal femurs on MRI imaging

EXAM:
CT CHEST, ABDOMEN, AND PELVIS WITH CONTRAST
TECHNIQUE: Multidetector CT imaging of the chest, abdomen and pelvis was
performed following the standard protocol during bolus
administration of intravenous contrast.

[Series 2: cap with · axial · 0.84mm/px · z∈[-252,+288]mm · 10 of 132 slices shown, 12 images]
[im 12/132  mediastinal]
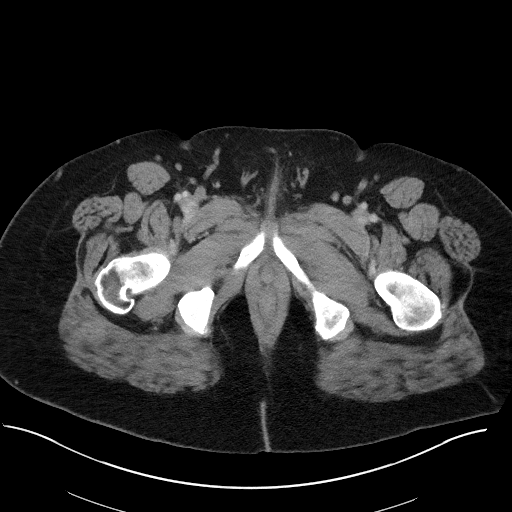
[im 12/132  bone]
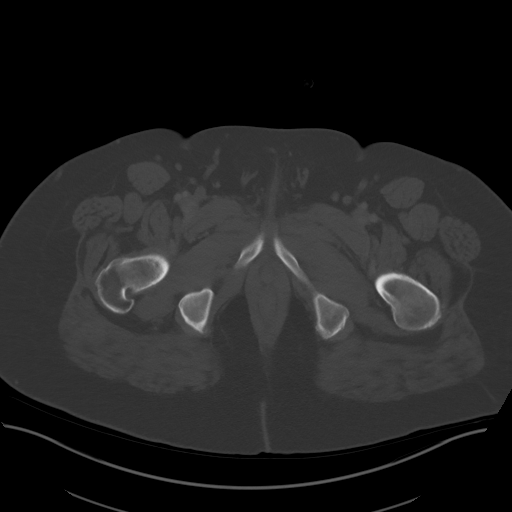
[im 24/132  mediastinal]
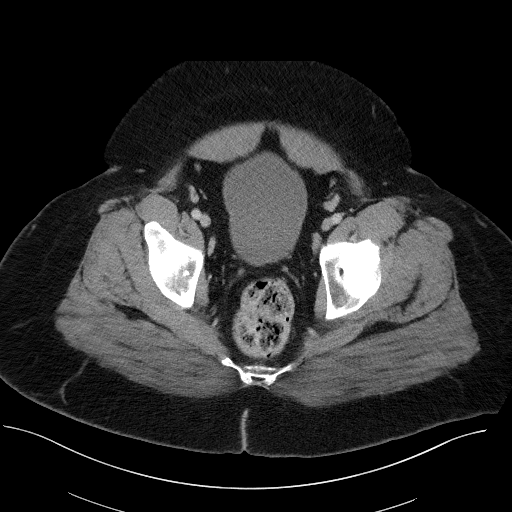
[im 36/132  mediastinal]
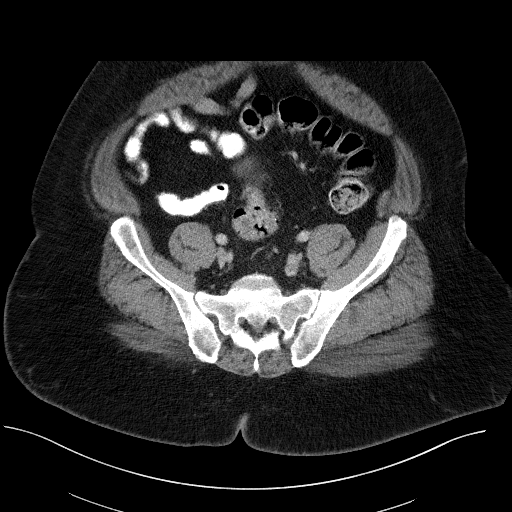
[im 48/132  mediastinal]
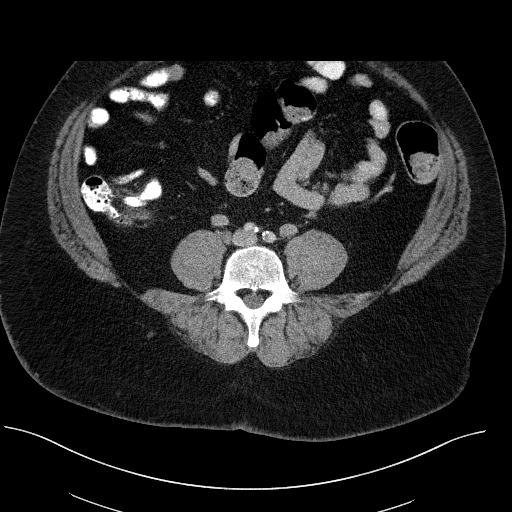
[im 60/132  mediastinal]
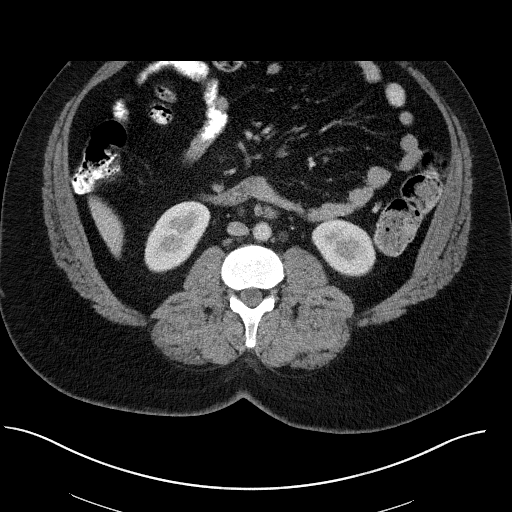
[im 72/132  mediastinal]
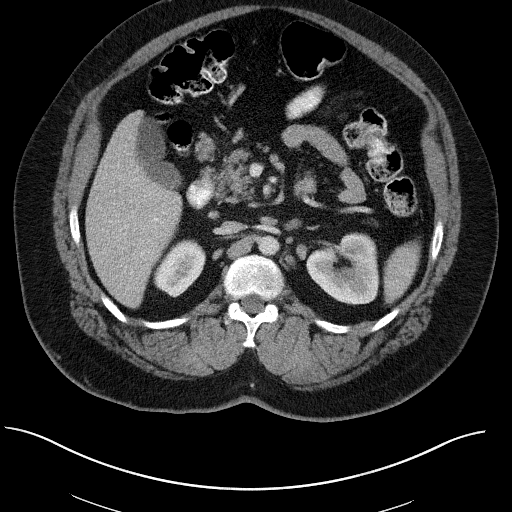
[im 84/132  mediastinal]
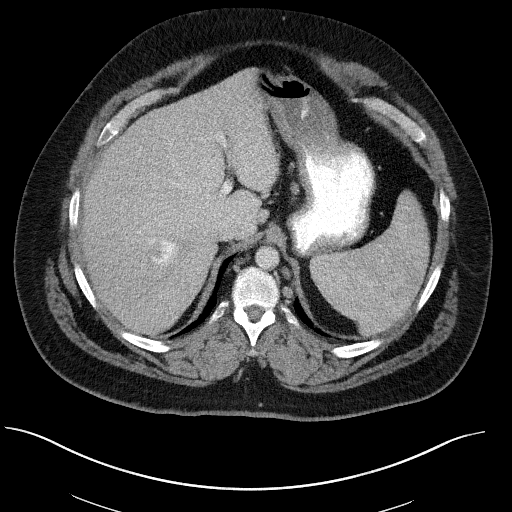
[im 96/132  mediastinal]
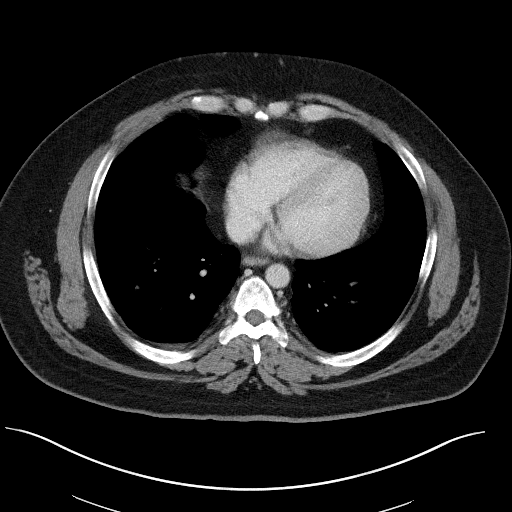
[im 108/132  mediastinal]
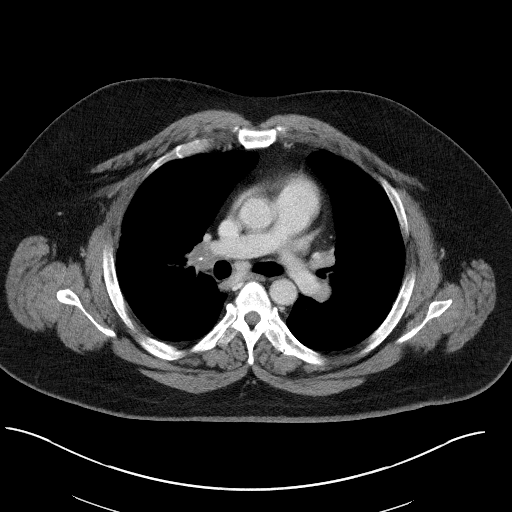
[im 108/132  bone]
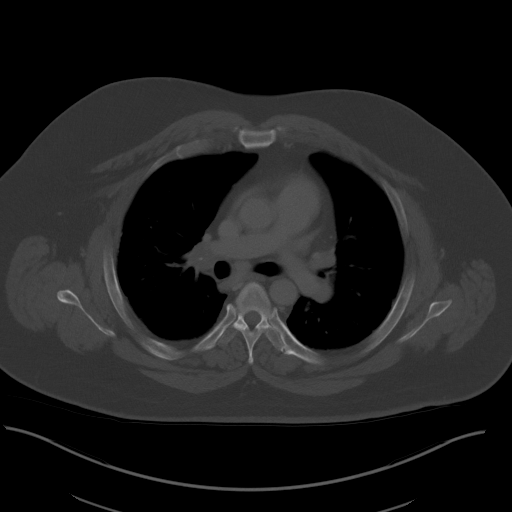
[im 120/132  mediastinal]
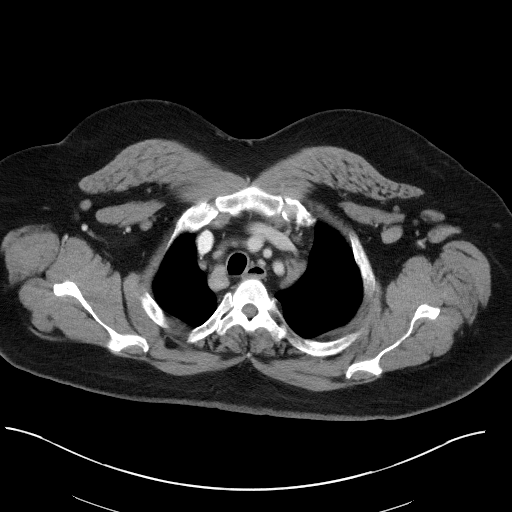

[Series 4: coronals · coronal · 0.82mm/px · 3 of 198 slices shown]
[im 40/198  mediastinal]
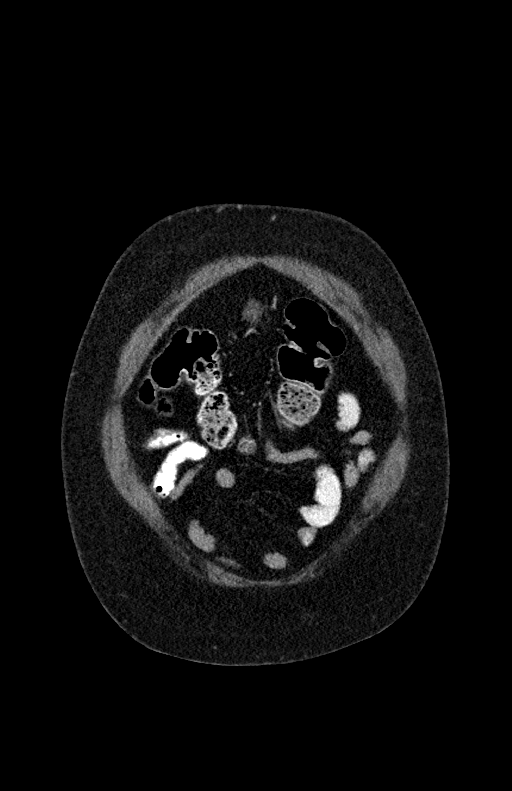
[im 79/198  mediastinal]
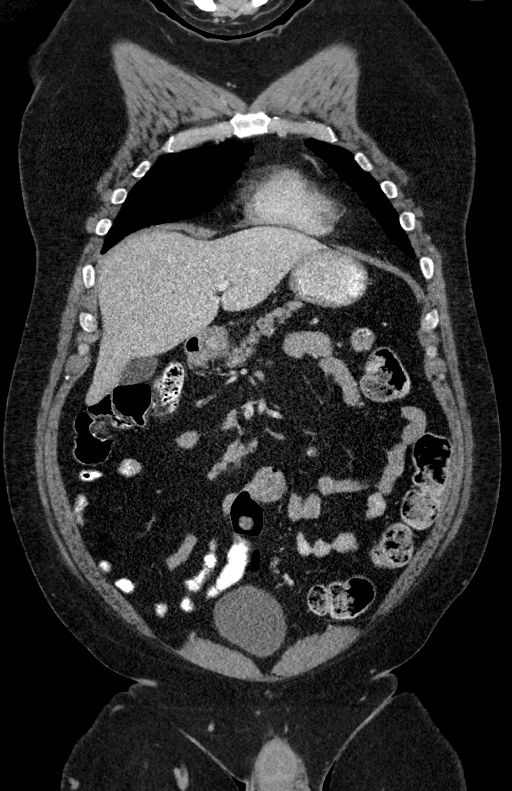
[im 119/198  mediastinal]
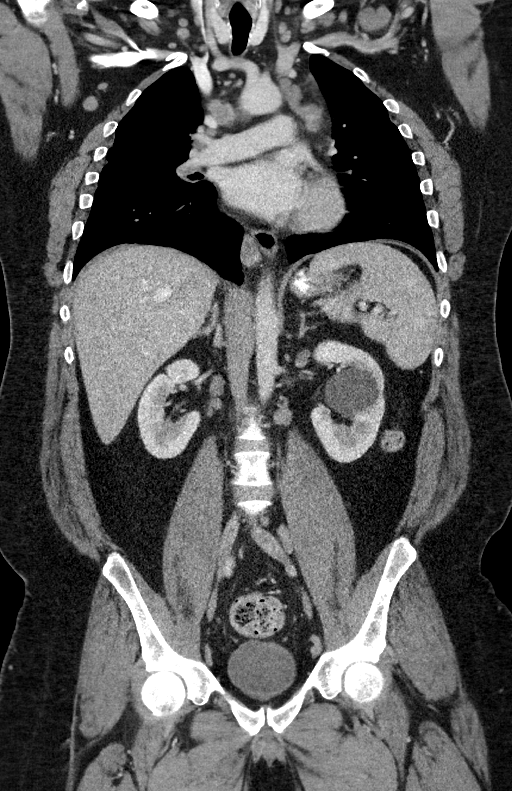

[13 of 36 positions shown; findings below may reference images not displayed]

RADIATION DOSE REDUCTION: This exam was performed according to the
departmental dose-optimization program which includes automated
exposure control, adjustment of the mA and/or kV according to
patient size and/or use of iterative reconstruction technique.

CONTRAST:  100mL OMNIPAQUE IOHEXOL 300 MG/ML  SOLN
FINDINGS: CT CHEST FINDINGS

Cardiovascular: Unremarkable

Mediastinum/Nodes: Bilateral supraclavicular, prevascular, right and
left paratracheal, subcarinal, axillary, bilateral hilar, and lower
paraesophageal adenopathy observed. Right paratracheal node 1.7 cm
in short axis on image 13 series 2. Subcarinal adenopathy 1.9 cm in
short axis, image 28 series 2. Right hilar node with some faint
internal calcification measures 1.8 cm in short axis image 25 series
2.

Lungs/Pleura: Scattered small indistinct peribronchovascular lymph
nodes are observed bilaterally, index nodule in the right lower lobe
5 by 6 mm on image 73 of series 3. Most of these are somewhat
ill-defined. Mild nodularity along the fissures bilaterally. No
large or dominant pulmonary nodule is identified.

Musculoskeletal: Unremarkable

CT ABDOMEN PELVIS FINDINGS

Hepatobiliary: 2.4 by 2.0 cm enhancing lesion in the right hepatic
lobe on image 49 series 2 has a small component of lesser
enhancement compared to the rest of the lesion. This lesion is
nonspecific and could represent a hemangioma or other lesion. It
does appear to have diffuse delayed enhancement which would tend to
favor a hemangioma. Gallbladder unremarkable. No biliary dilatation.

Pancreas: Unremarkable

Spleen: Unremarkable.  No splenomegaly.

Adrenals/Urinary Tract: 4.8 by 4.0 by 3.7 cm fluid density left mid
kidney lesion favoring simple cyst. Unremarkable

Stomach/Bowel: Prominent stool throughout the colon favors
constipation.

Vascular/Lymphatic: Atherosclerosis is present, including aortoiliac
atherosclerotic disease.

There is pathologic right gastric, peripancreatic, porta hepatis,
retroperitoneal, and pelvic adenopathy. Index portacaval node 2.2 cm
in short axis, image 57 series 2. Index aortocaval node 1.4 cm in
short axis, image 81 series 2. Index right external iliac node
cm in short axis, image 113 series 2.

Reproductive: Unremarkable

Other: No supplemental non-categorized findings.

Musculoskeletal: Bridging spurring of the left SI joint superiorly.
The bone marrow lesions shown in the bony pelvis and proximal femurs
on prior MRI are fairly occult on CT.
IMPRESSION: 1. Abnormal adenopathy in the chest, abdomen, and pelvis without an
obvious primary lesion. The appearance is suspicious for
lymphoproliferative disease such as lymphoma. The bony involvement
seen on MRI is not readily apparent on CT suggesting infiltrative
marrow process. Oncology referral recommended.
2. No overt splenomegaly or focal splenic lesions identified.
3. There is some scattered small pulmonary nodules measuring up to
about 5 by 6 mm. These could be related to lymphoproliferative
disease directly or may be infectious/inflammatory. Possibility of
sarcoidosis is a cause for thoracic adenopathy and the small
pulmonary nodules is raised, but the degree of adenopathy in the
abdomen/pelvis would be unusual for sarcoidosis.
4.  Prominent stool throughout the colon favors constipation.
5.  Aortic Atherosclerosis ([BV]-[BV]).

## 2021-10-19 MED ORDER — IOHEXOL 300 MG/ML  SOLN
100.0000 mL | Freq: Once | INTRAMUSCULAR | Status: AC | PRN
  Administered 2021-10-19: 100 mL via INTRAVENOUS

## 2021-10-21 ENCOUNTER — Encounter (HOSPITAL_COMMUNITY): Payer: Self-pay

## 2021-10-21 NOTE — Progress Notes (Signed)
Introductory phone call placed to patient today. Introduced myself and explained my role in the patient's care. Provided a reminder of patient's upcoming appt. Patient made aware of clinic's location and visitor policy. No further questions at this time per patient.  

## 2021-10-22 ENCOUNTER — Encounter (HOSPITAL_COMMUNITY): Payer: Self-pay

## 2021-10-22 ENCOUNTER — Inpatient Hospital Stay (HOSPITAL_COMMUNITY): Payer: 59 | Attending: Hematology | Admitting: Hematology

## 2021-10-22 ENCOUNTER — Other Ambulatory Visit: Payer: Self-pay

## 2021-10-22 ENCOUNTER — Inpatient Hospital Stay (HOSPITAL_COMMUNITY): Payer: 59

## 2021-10-22 VITALS — BP 140/87 | HR 109 | Temp 99.1°F | Resp 18 | Ht 67.0 in | Wt 281.5 lb

## 2021-10-22 DIAGNOSIS — Z803 Family history of malignant neoplasm of breast: Secondary | ICD-10-CM | POA: Diagnosis not present

## 2021-10-22 DIAGNOSIS — R296 Repeated falls: Secondary | ICD-10-CM | POA: Insufficient documentation

## 2021-10-22 DIAGNOSIS — Z79899 Other long term (current) drug therapy: Secondary | ICD-10-CM | POA: Insufficient documentation

## 2021-10-22 DIAGNOSIS — Z87891 Personal history of nicotine dependence: Secondary | ICD-10-CM | POA: Insufficient documentation

## 2021-10-22 DIAGNOSIS — R29898 Other symptoms and signs involving the musculoskeletal system: Secondary | ICD-10-CM

## 2021-10-22 DIAGNOSIS — R531 Weakness: Secondary | ICD-10-CM | POA: Diagnosis not present

## 2021-10-22 DIAGNOSIS — R591 Generalized enlarged lymph nodes: Secondary | ICD-10-CM | POA: Diagnosis present

## 2021-10-22 DIAGNOSIS — Z8042 Family history of malignant neoplasm of prostate: Secondary | ICD-10-CM | POA: Insufficient documentation

## 2021-10-22 LAB — CBC WITH DIFFERENTIAL/PLATELET
Abs Immature Granulocytes: 0.01 10*3/uL (ref 0.00–0.07)
Basophils Absolute: 0 10*3/uL (ref 0.0–0.1)
Basophils Relative: 0 %
Eosinophils Absolute: 0.1 10*3/uL (ref 0.0–0.5)
Eosinophils Relative: 2 %
HCT: 38.5 % — ABNORMAL LOW (ref 39.0–52.0)
Hemoglobin: 12.3 g/dL — ABNORMAL LOW (ref 13.0–17.0)
Immature Granulocytes: 0 %
Lymphocytes Relative: 10 %
Lymphs Abs: 0.5 10*3/uL — ABNORMAL LOW (ref 0.7–4.0)
MCH: 28.6 pg (ref 26.0–34.0)
MCHC: 31.9 g/dL (ref 30.0–36.0)
MCV: 89.5 fL (ref 80.0–100.0)
Monocytes Absolute: 0.3 10*3/uL (ref 0.1–1.0)
Monocytes Relative: 6 %
Neutro Abs: 3.7 10*3/uL (ref 1.7–7.7)
Neutrophils Relative %: 82 %
Platelets: 226 10*3/uL (ref 150–400)
RBC: 4.3 MIL/uL (ref 4.22–5.81)
RDW: 13.5 % (ref 11.5–15.5)
WBC: 4.6 10*3/uL (ref 4.0–10.5)
nRBC: 0 % (ref 0.0–0.2)

## 2021-10-22 LAB — C-REACTIVE PROTEIN: CRP: 1.5 mg/dL — ABNORMAL HIGH (ref <1.0)

## 2021-10-22 LAB — COMPREHENSIVE METABOLIC PANEL
ALT: 12 U/L (ref 0–44)
AST: 17 U/L (ref 15–41)
Albumin: 3.8 g/dL (ref 3.5–5.0)
Alkaline Phosphatase: 87 U/L (ref 38–126)
Anion gap: 12 (ref 5–15)
BUN: 12 mg/dL (ref 6–20)
CO2: 22 mmol/L (ref 22–32)
Calcium: 8.8 mg/dL — ABNORMAL LOW (ref 8.9–10.3)
Chloride: 101 mmol/L (ref 98–111)
Creatinine, Ser: 0.92 mg/dL (ref 0.61–1.24)
GFR, Estimated: >60 mL/min (ref >60)
Glucose, Bld: 131 mg/dL — ABNORMAL HIGH (ref 70–99)
Potassium: 4 mmol/L (ref 3.5–5.1)
Sodium: 135 mmol/L (ref 135–145)
Total Bilirubin: 0.8 mg/dL (ref 0.3–1.2)
Total Protein: 7.8 g/dL (ref 6.5–8.1)

## 2021-10-22 LAB — HIV ANTIBODY (ROUTINE TESTING W REFLEX): HIV Screen 4th Generation wRfx: NONREACTIVE

## 2021-10-22 LAB — LACTATE DEHYDROGENASE: LDH: 133 U/L (ref 98–192)

## 2021-10-22 LAB — SEDIMENTATION RATE: Sed Rate: 45 mm/hr — ABNORMAL HIGH (ref 0–16)

## 2021-10-22 NOTE — Patient Instructions (Addendum)
[place] at [facility] Discharge Instructions  You were seen and examined today by Dr. Delton Coombes. Dr. Delton Coombes is a medical oncologist, meaning that he specializes in the treatment of cancer diagnoses. Dr. Delton Coombes discussed your past medical history, family history of cancers, and the events that led to you being here today.  You were referred to Dr. Delton Coombes due to your recent abnormal PET scan which showed enlarged lymph nodes in your chest, abdomen and pelvis that should not be present. This is concerning for Lymphoma but there are benign conditions that can mimic lymphoma.  Dr. Delton Coombes has recommended a PET scan. A PET scan is a specialized CT scan that illuminates where there is cancer present in the body. If it is cancer, the PET scan will be helpful in identifying stage but it is also going to give [place] a site to biopsy in order to confirm diagnosis.  Dr. Delton Coombes has also recommended a MRI of your spine due to your lower extremity weakness and falls.  Dr. Delton Coombes has also recommended lab work today.  Follow-up as scheduled.   Thank you for choosing [place] at [facility] to provide your oncology and hematology care.  To afford each patient quality time with our provider, please arrive at least 15 minutes before your scheduled appointment time.   If you have a lab appointment with the [facility] please come in thru the Main Entrance and check in at the main information desk.  You need to re-schedule your appointment should you arrive 10 or more minutes late.  We strive to give you quality time with our providers, and arriving late affects you and other patients whose appointments are after yours.  Also, if you no show three or more times for appointments you may be dismissed from the clinic at the providers discretion.     Again, thank you for choosing [facility].  Our hope is that these requests will  decrease the amount of time that you wait before being seen by our physicians.       _____________________________________________________________  Should you have questions after your visit to [facility], please contact our office at [phone] and follow the prompts.  Our office hours are 8:00 a.m. and 4:30 p.m. Monday - Friday.  Please note that voicemails left after 4:00 p.m. may not be returned until the following business day.  We are closed weekends and major holidays.  You do have access to a nurse 24-7, just call the main number to the clinic [phone] and do not press any options, hold on the line and a nurse will answer the phone.    For prescription refill requests, have your pharmacy contact our office and allow 72 hours.    Due to Covid, you will need to wear a mask upon entering the hospital. If you do not have a mask, a mask will be given to you at the Main Entrance upon arrival. For doctor visits, patients may have 1 support person age 64 or older with them. For treatment visits, patients can not have anyone with them due to social distancing guidelines and our immunocompromised population.

## 2021-10-22 NOTE — Progress Notes (Signed)
[place], [place] [address]   CLINIC:  Medical Oncology/Hematology  CONSULT NOTE  Patient Care Team: [facility] as PCP - General Derek Jack, MD as Medical Oncologist (Medical Oncology) Brien Mates, RN as Oncology Nurse Navigator (Medical Oncology)  CHIEF COMPLAINTS/PURPOSE OF CONSULTATION:  Evaluation of generalized lymphadenopathy  HISTORY OF PRESENTING ILLNESS:  Mr. James Hartman 46 y.o. male is here because of evaluation of generalized lymphadenopathy.  Today he reports feeling well, and he is accompanied by his fiancee. He reports numbness around his waist, legs, and feet, weakness resulting in falls, erectile dysfunction, and urinary and fecal incontinence starting 1 year ago. He denies fevers, tingling/numbness in his hands, significant unexpected weight loss, and night sweats. He denies headaches, vision changes, and itching. He currently walks with a cane due to his weakness in his legs and history of falls. He reports swelling in his legs which is helped by Gabapentin. He has a history of HTN and sleep apnea. He reports occasional cramping in his hands, and his fiancee reports he frequently has full body shaking which he reports he cannot feel. He denies history of blood transfusions and hepatitis.   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 He quit smoking 20 years ago. His mother had cancer, his maternal grandfather had prostate cancer, and his maternal aunt had breast cancer.   MEDICAL HISTORY:  Past Medical History:  Diagnosis Date   Hypertension     SURGICAL HISTORY: No past surgical history on file.  SOCIAL HISTORY: Social History   Socioeconomic History   Marital status: Single    Spouse name: Not on file   Number of children: Not on file   Years of education: Not on file   Highest education level: Not on  file  Occupational History   Not on file  Tobacco Use   Smoking status: Former   Smokeless tobacco: Never  Substance and Sexual Activity   Alcohol use: Not Currently   Drug use: No   Sexual activity: Not on file  Other Topics Concern   Not on file  Social History Narrative   Not on file   Social Determinants of Health   Financial Resource Strain: Not on file  Food Insecurity: Not on file  Transportation Needs: Not on file  Physical Activity: Not on file  Stress: Not on file  Social Connections: Not on file  Intimate Partner Violence: Not on file    FAMILY HISTORY: No family history on file.  ALLERGIES:  has No Known Allergies.  MEDICATIONS:  Current Outpatient Medications  Medication Sig Dispense Refill   azithromycin (ZITHROMAX) 250 MG tablet Take 1 tablet (250 mg total) by mouth daily. Take first 2 tablets together, then 1 every day until finished. 6 tablet 0   FLOVENT HFA 220 MCG/ACT inhaler Inhale into the lungs.     fluticasone (FLONASE) 50 MCG/ACT nasal spray Place into both nostrils.     folic acid (FOLVITE) 1 MG tablet Take 1 mg by mouth daily.     gabapentin (NEURONTIN) 300 MG capsule Take 300 mg by mouth 3 (three) times daily.     hydrochlorothiazide (HYDRODIURIL) 12.5 MG tablet 1 tablet     ibuprofen (ADVIL) 200 MG tablet Take 400 mg by mouth every 6 (six) hours as needed for mild pain.     pravastatin (PRAVACHOL) 40  MG tablet Take 40 mg by mouth daily.     predniSONE (DELTASONE) 10 MG tablet 6 tablets for 2 days, 5 tablets for 2 days, 4 tablets for 2 days, 3 tablets for 2 days, 2 tablets for 2 days, 1 tablet for 2 days     valsartan (DIOVAN) 80 MG tablet Take 80 mg by mouth daily.     No current facility-administered medications for this visit.    REVIEW OF SYSTEMS:   Review of Systems  Constitutional:  Positive for fatigue. Negative for appetite change, fever and unexpected weight change.  Eyes:  Negative for eye problems.  Respiratory:  Positive for  shortness of breath.   Cardiovascular:  Positive for leg swelling.  Gastrointestinal:  Positive for constipation.  Endocrine: Negative for hot flashes.  Genitourinary:  Positive for frequency.   Musculoskeletal:  Positive for arthralgias (4/10 legs and feet).  Skin:  Negative for itching.  Neurological:  Positive for extremity weakness (legs) and numbness (waist and feet). Negative for headaches.  Psychiatric/Behavioral:  Positive for sleep disturbance.   All other systems reviewed and are negative.   PHYSICAL EXAMINATION: ECOG PERFORMANCE STATUS: 1 - Symptomatic but completely ambulatory  Vitals:   10/22/21 0806  BP: 140/87  Pulse: (!) 109  Resp: 18  Temp: 99.1 F (37.3 C)  SpO2: 99%   Filed Weights   10/22/21 0806  Weight: 281 lb 8 oz (127.7 kg)   Physical Exam Vitals reviewed.  Constitutional:      Appearance: Normal appearance. He is obese.  Cardiovascular:     Rate and Rhythm: Normal rate and regular rhythm.     Pulses: Normal pulses.     Heart sounds: Normal heart sounds.  Pulmonary:     Effort: Pulmonary effort is normal.     Breath sounds: Normal breath sounds.  Abdominal:     Palpations: Abdomen is soft. There is no hepatomegaly, splenomegaly or mass.     Tenderness: There is no abdominal tenderness.  Musculoskeletal:     Right lower leg: No edema.     Left lower leg: No edema.     Comments: 4/5 strength in bilateral LE  Lymphadenopathy:     Upper Body:     Right upper body: No supraclavicular, axillary or pectoral adenopathy.     Left upper body: No supraclavicular, axillary or pectoral adenopathy.     Lower Body: No right inguinal adenopathy. No left inguinal adenopathy.  Neurological:     General: No focal deficit present.     Mental Status: He is alert and oriented to person, place, and time.  Psychiatric:        Mood and Affect: Mood normal.        Behavior: Behavior normal.     LABORATORY DATA:  I have reviewed the data as listed CBC Latest  Ref Rng & Units 02/17/2021  WBC 4.0 - 10.5 K/uL 3.9(L)  Hemoglobin 13.0 - 17.0 g/dL 12.8(L)  Hematocrit 39.0 - 52.0 % 39.1  Platelets 150 - 400 K/uL 218   CMP Latest Ref Rng & Units 10/19/2021 02/17/2021  Glucose 70 - 99 mg/dL - 100(H)  BUN 6 - 20 mg/dL - 17  Creatinine 0.61 - 1.24 mg/dL 1.10 0.95  Sodium 135 - 145 mmol/L - 140  Potassium 3.5 - 5.1 mmol/L - 3.5  Chloride 98 - 111 mmol/L - 106  CO2 22 - 32 mmol/L - 28  Calcium 8.9 - 10.3 mg/dL - 8.8(L)  Total Protein 6.5 - 8.1 g/dL -  7.2  Total Bilirubin 0.3 - 1.2 mg/dL - 0.9  Alkaline Phos 38 - 126 U/L - 86  AST 15 - 41 U/L - 15  ALT 0 - 44 U/L - 13    RADIOGRAPHIC STUDIES: I have personally reviewed the radiological images as listed and agreed with the findings in the report. MR LUMBAR SPINE WO CONTRAST  Result Date: 09/30/2021 CLINICAL DATA:  Low back pain with bilateral radiculopathy for 1 year EXAM: MRI LUMBAR SPINE WITHOUT CONTRAST TECHNIQUE: Multiplanar, multisequence MR imaging of the lumbar spine was performed. No intravenous contrast was administered. COMPARISON:  None. FINDINGS: Segmentation:  Standard. Alignment:  Physiologic. Vertebrae: Vertebral body heights are maintained without fracture. No evidence of discitis. No marrow replacing bone lesion. Bone marrow edema within the bilateral sacral ala without well-defined fracture line on the included sequences. Conus medullaris and cauda equina: Conus extends to the T12-L1 level. Conus and cauda equina appear normal. Paraspinal and other soft tissues: Multiple enlarged retroperitoneal lymph nodes. Large cyst within the left kidney. Disc levels: T12-L1: Unremarkable. L1-L2: Unremarkable. L2-L3: Unremarkable. L3-L4: Unremarkable. L4-L5: Mild annular disc bulge. Minimal bilateral facet arthropathy. No canal stenosis. No significant foraminal stenosis. L5-S1: Unremarkable. IMPRESSION: 1. Bone marrow edema within the bilateral sacral ala without well-defined fracture line on the included  sequences. Findings could represent sacral insufficiency fractures. A marrow infiltrative process is not excluded. Further evaluation with MRI of the pelvis without and with IV contrast is recommended. 2. No significant disc disease of the lumbar spine. No canal or foraminal stenosis at any level. 3. Multiple enlarged retroperitoneal lymph nodes, which may be reactive, represent a lymphoproliferative process or metastatic disease. Further hematologic workup including contrast-enhanced CT of the abdomen and pelvis is recommended. These results will be called to the ordering clinician or representative by the Radiologist Assistant, and communication documented in the PACS or [employer]. Electronically Signed   By: Davina Poke D.O.   On: 09/30/2021 10:37   MR PELVIS W WO CONTRAST  Result Date: 10/09/2021 CLINICAL DATA:  Marrow edema in the bilateral sacral ala on recent lumbar spine MRI. Concern for infiltrative marrow process. EXAM: MRI PELVIS WITHOUT AND WITH CONTRAST TECHNIQUE: Multiplanar multisequence MR imaging of the pelvis was performed both before and after administration of intravenous contrast. CONTRAST:  49m GADAVIST GADOBUTROL 1 MMOL/ML IV SOLN COMPARISON:  Lumbar spine MRI 09/29/2021 FINDINGS: Urinary Tract:  No abnormality visualized. Bowel:  Unremarkable visualized pelvic bowel loops. Vascular/Lymphatic: There are enlarged external iliac lymph nodes, measuring up to 1.2 cm on the right and 1.3 cm on left (axial T1 images 12 and 14). Partially visualized enlarged retroperitoneal lymph nodes, as seen on recent lumbar spine MRI. Reproductive:  Unremarkable Other:  None Musculoskeletal: There are multiple T2 hyperintense/T1 hypointense enhancing lesions throughout the sacrum/pelvis and proximal femurs proximal femurs. There also tiny lesions in the L4 and L5 vertebral bodies and spinous process of L5. These lesions have irregular margins and internal heterogeneity. There is no evidence of  acute fracture. There is no significant SI joint arthritis or evidence of sacroiliitis. IMPRESSION: Multiple T2 hyperintense/T1 hypointense enhancing lesions throughout the pelvis and proximal femurs, and tiny lesions in the L4 and L5 vertebral bodies and L5 spinous process. Enlarged retroperitoneal and bilateral external iliac lymph nodes. Findings are highly suspicious for lymphoproliferative process or metastatic disease. Recommend malignancy workup with hematology/oncology referral and likely CT of the chest, abdomen, and pelvis. These results were called by telephone on 10/09/2021 at 10:11 am to provider BTanzania  Florene Glen, RN, who verbally acknowledged these results. Per discussion, this information will be relayed to the ordering provider. Electronically Signed   By: Maurine Simmering M.D.   On: 10/09/2021 10:19   CT CHEST ABDOMEN PELVIS W CONTRAST  Result Date: 10/19/2021 CLINICAL DATA:  Adenopathy with multifocal areas of marrow edema and enhancement in the bony pelvis and proximal femurs on MRI imaging EXAM: CT CHEST, ABDOMEN, AND PELVIS WITH CONTRAST TECHNIQUE: Multidetector CT imaging of the chest, abdomen and pelvis was performed following the standard protocol during bolus administration of intravenous contrast. RADIATION DOSE REDUCTION: This exam was performed according to the departmental dose-optimization program which includes automated exposure control, adjustment of the mA and/or kV according to patient size and/or use of iterative reconstruction technique. CONTRAST:  174m OMNIPAQUE IOHEXOL 300 MG/ML  SOLN COMPARISON:  MRI examinations from 10/09/2021 and 09/29/2021 FINDINGS: CT CHEST FINDINGS Cardiovascular: Unremarkable Mediastinum/Nodes: Bilateral supraclavicular, prevascular, right and left paratracheal, subcarinal, axillary, bilateral hilar, and lower paraesophageal adenopathy observed. Right paratracheal node 1.7 cm in short axis on image 13 series 2. Subcarinal adenopathy 1.9 cm in short axis,  image 28 series 2. Right hilar node with some faint internal calcification measures 1.8 cm in short axis image 25 series 2. Lungs/Pleura: Scattered small indistinct peribronchovascular lymph nodes are observed bilaterally, index nodule in the right lower lobe 5 by 6 mm on image 73 of series 3. Most of these are somewhat ill-defined. Mild nodularity along the fissures bilaterally. No large or dominant pulmonary nodule is identified. Musculoskeletal: Unremarkable CT ABDOMEN PELVIS FINDINGS Hepatobiliary: 2.4 by 2.0 cm enhancing lesion in the right hepatic lobe on image 49 series 2 has a small component of lesser enhancement compared to the rest of the lesion. This lesion is nonspecific and could represent a hemangioma or other lesion. It does appear to have diffuse delayed enhancement which would tend to favor a hemangioma. Gallbladder unremarkable. No biliary dilatation. Pancreas: Unremarkable Spleen: Unremarkable.  No splenomegaly. Adrenals/Urinary Tract: 4.8 by 4.0 by 3.7 cm fluid density left mid kidney lesion favoring simple cyst. Unremarkable Stomach/Bowel: Prominent stool throughout the colon favors constipation. Vascular/Lymphatic: Atherosclerosis is present, including aortoiliac atherosclerotic disease. There is pathologic right gastric, peripancreatic, porta hepatis, retroperitoneal, and pelvic adenopathy. Index portacaval node 2.2 cm in short axis, image 57 series 2. Index aortocaval node 1.4 cm in short axis, image 81 series 2. Index right external iliac node 1.4 cm in short axis, image 113 series 2. Reproductive: Unremarkable Other: No supplemental non-categorized findings. Musculoskeletal: Bridging spurring of the left SI joint superiorly. The bone marrow lesions shown in the bony pelvis and proximal femurs on prior MRI are fairly occult on CT. IMPRESSION: 1. Abnormal adenopathy in the chest, abdomen, and pelvis without an obvious primary lesion. The appearance is suspicious for lymphoproliferative  disease such as lymphoma. The bony involvement seen on MRI is not readily apparent on CT suggesting infiltrative marrow process. Oncology referral recommended. 2. No overt splenomegaly or focal splenic lesions identified. 3. There is some scattered small pulmonary nodules measuring up to about 5 by 6 mm. These could be related to lymphoproliferative disease directly or may be infectious/inflammatory. Possibility of sarcoidosis is a cause for thoracic adenopathy and the small pulmonary nodules is raised, but the degree of adenopathy in the abdomen/pelvis would be unusual for sarcoidosis. 4.  Prominent stool throughout the colon favors constipation. 5.  Aortic Atherosclerosis (ICD10-I70.0). Electronically Signed   By: WVan ClinesM.D.   On: 10/19/2021 11:55    ASSESSMENT:  Generalized lymphadenopathy: - MRI of the lumbar spine on 09/29/2021: Bone marrow edema in the bilateral sacral ala, to evaluate for bone marrow infiltrative process.  Multiple enlarged retroperitoneal lymph nodes. - CT CAP on 10/19/2021 shows adenopathy in the chest, abdomen and pelvis without obvious primary lesion.  Bone involvement seen on MRI not seen on CT.  No overt splenomegaly.  Some scattered small pulmonary nodules measuring up to 5 to 6 mm. - Denies any fevers, night sweats or weight loss. - Reports loss of urinary and bladder control, numbness in the legs and toes, falling, for the last 1 year.  Evaluated by neurology.   Social/family history: - He lives with his fiance.  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  Non-smoker. - Mother died of cancer.  Maternal grandfather had prostate cancer.  Maternal aunt had breast cancer.   PLAN:  Generalized lymphadenopathy: - We discussed the various possibilities of generalized lymphadenopathy including lymphoproliferative disorders. - We will check ESR/CRP, HIV, ACE level. - Would recommend PET CT scan.  2.  Lower extremity  weakness: - Because of the lower extremity weakness and paresthesias and bowel and bladder dysfunction, I have recommended MRI of the thoracic spine with and without contrast. - We will also check for SPEP, immunofixation and free light chains.   All questions were answered. The patient knows to call the clinic with any problems, questions or concerns.   Derek Jack, MD, 10/22/21 9:07 AM  [place] [phone]   I, Thana Ates, am acting as a scribe for Dr. Derek Jack.  I, Derek Jack MD, have reviewed the above documentation for accuracy and completeness, and I agree with the above.

## 2021-10-22 NOTE — Progress Notes (Signed)
I met with the patient and his significant other today during and following initial visit with Dr. Delton Coombes. I provided my contact information and encouraged to call with questions or concerns. All questions addressed and answered to patient and family's satisfaction.

## 2021-10-23 LAB — PROTEIN ELECTROPHORESIS, SERUM
A/G Ratio: 0.9 (ref 0.7–1.7)
Albumin ELP: 3.6 g/dL (ref 2.9–4.4)
Alpha-1-Globulin: 0.3 g/dL (ref 0.0–0.4)
Alpha-2-Globulin: 0.9 g/dL (ref 0.4–1.0)
Beta Globulin: 1.4 g/dL — ABNORMAL HIGH (ref 0.7–1.3)
Gamma Globulin: 1.5 g/dL (ref 0.4–1.8)
Globulin, Total: 4.2 g/dL — ABNORMAL HIGH (ref 2.2–3.9)
Total Protein ELP: 7.8 g/dL (ref 6.0–8.5)

## 2021-10-23 LAB — KAPPA/LAMBDA LIGHT CHAINS
Kappa free light chain: 53 mg/L — ABNORMAL HIGH (ref 3.3–19.4)
Kappa, lambda light chain ratio: 1.83 — ABNORMAL HIGH (ref 0.26–1.65)
Lambda free light chains: 29 mg/L — ABNORMAL HIGH (ref 5.7–26.3)

## 2021-10-23 LAB — ANGIOTENSIN CONVERTING ENZYME: Angiotensin-Converting Enzyme: 89 U/L — ABNORMAL HIGH (ref 14–82)

## 2021-10-23 LAB — BETA 2 MICROGLOBULIN, SERUM: Beta-2 Microglobulin: 3.1 mg/L — ABNORMAL HIGH (ref 0.6–2.4)

## 2021-10-26 LAB — IMMUNOFIXATION ELECTROPHORESIS
IgA: 463 mg/dL — ABNORMAL HIGH (ref 90–386)
IgG (Immunoglobin G), Serum: 1507 mg/dL (ref 603–1613)
IgM (Immunoglobulin M), Srm: 61 mg/dL (ref 20–172)
Total Protein ELP: 7.8 g/dL (ref 6.0–8.5)

## 2021-10-29 ENCOUNTER — Other Ambulatory Visit: Payer: Self-pay

## 2021-10-29 ENCOUNTER — Encounter (HOSPITAL_COMMUNITY)
Admission: RE | Admit: 2021-10-29 | Discharge: 2021-10-29 | Disposition: A | Discharge status: Home / Self Care | Payer: 59 | Source: Ambulatory Visit | Attending: Hematology | Admitting: Hematology

## 2021-10-29 DIAGNOSIS — R591 Generalized enlarged lymph nodes: Secondary | ICD-10-CM | POA: Diagnosis not present

## 2021-10-29 MED ORDER — FLUDEOXYGLUCOSE F - 18 (FDG) INJECTION
15.0100 | Freq: Once | INTRAVENOUS | Status: AC | PRN
  Administered 2021-10-29: 15.01 via INTRAVENOUS

## 2021-11-02 ENCOUNTER — Ambulatory Visit (HOSPITAL_COMMUNITY)
Admission: RE | Admit: 2021-11-02 | Discharge: 2021-11-02 | Disposition: A | Discharge status: Home / Self Care | Payer: 59 | Source: Ambulatory Visit | Attending: Hematology | Admitting: Hematology

## 2021-11-02 ENCOUNTER — Other Ambulatory Visit: Payer: Self-pay

## 2021-11-02 DIAGNOSIS — R29898 Other symptoms and signs involving the musculoskeletal system: Secondary | ICD-10-CM | POA: Diagnosis not present

## 2021-11-02 IMAGING — MR MR THORACIC SPINE WO/W CM
7 of 9 series · 30 of 48 positions shown · IV contrast (gadavist)
Comparison: PET-CT [DATE].

CLINICAL DATA: Bilateral lower extremity weakness and numbness.
Repeated falls. Lymphadenopathy and bone lesions on previous
imaging.

EXAM:
MRI THORACIC WITHOUT AND WITH CONTRAST
TECHNIQUE: Multiplanar and multiecho pulse sequences of the thoracic spine were
obtained without and with intravenous contrast.
CONTRAST:  10mL GADAVIST GADOBUTROL 1 MMOL/ML IV SOLN

[Series 19: T2 · sagittal · 3.0mm · 1.06mm/px · 3 of 17 slices shown (1 of 2)]
[im 1/17]
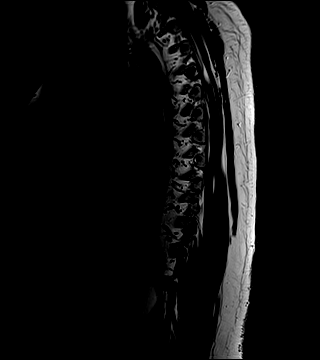
[im 9/17]
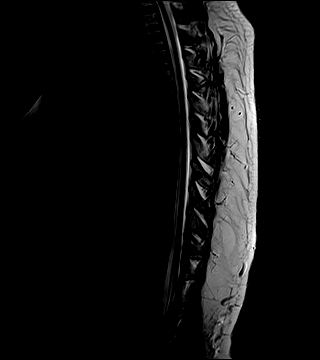
[im 17/17]
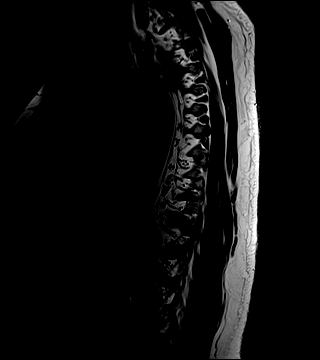

[Series 20: T1 · sagittal · 5.0mm · 1.77mm/px · 1 of 9 slices shown (1 of 3)]
[im 1/9]
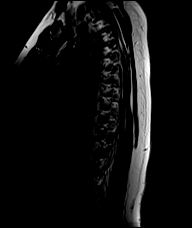

[Series 21: T1 · sagittal · 3.0mm · 1.33mm/px · 4 of 17 slices shown (2 of 3)]
[im 1/17]
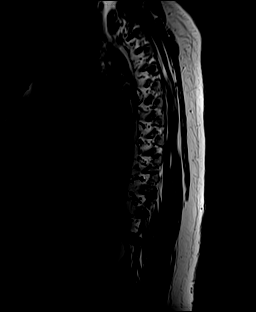
[im 6/17]
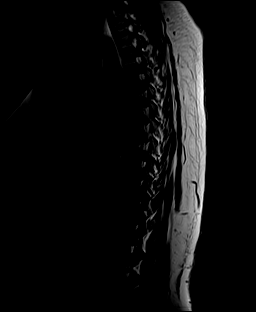
[im 11/17]
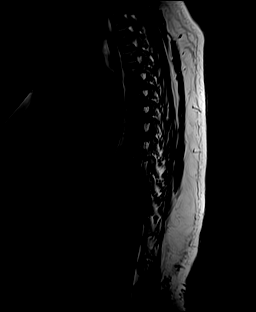
[im 17/17]
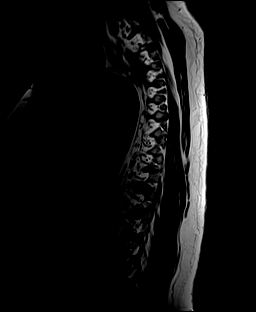

[Series 22: STIR · sagittal · 3.0mm · 0.53mm/px · 2 of 17 slices shown]
[im 1/17]
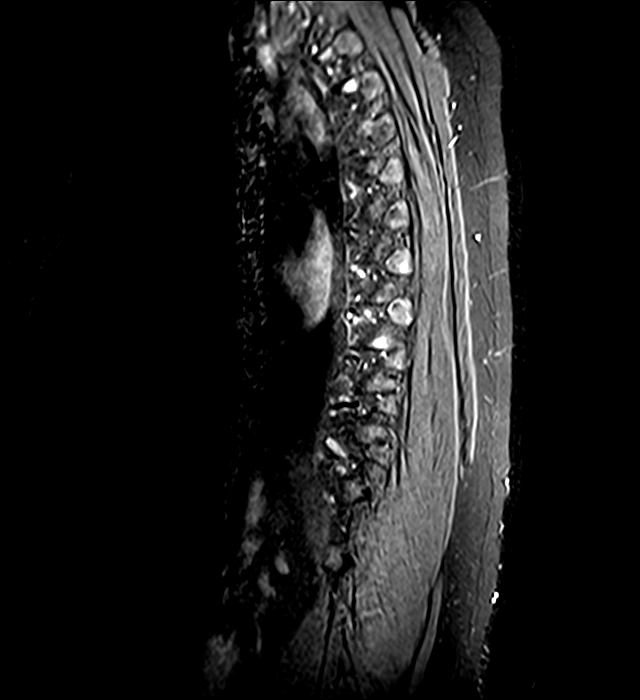
[im 6/17]
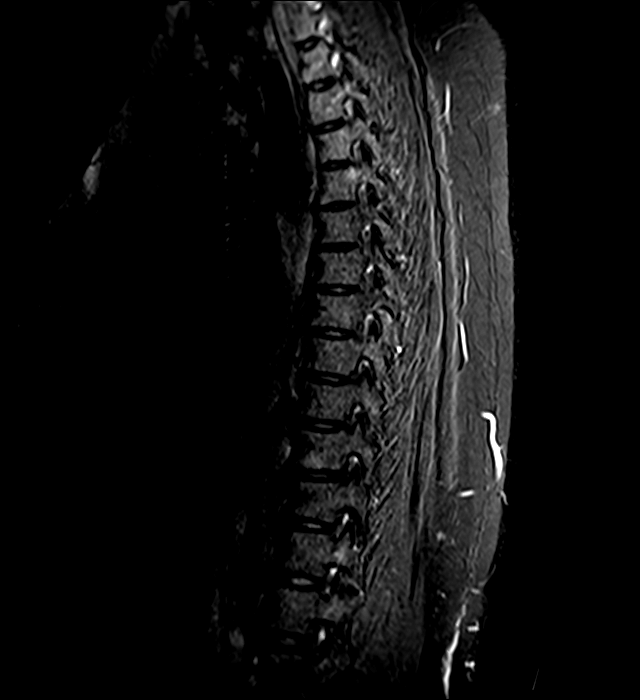

[Series 23: T2 · axial · 5.0mm · 0.74mm/px · z∈[-383,-113]mm · 8 of 39 slices shown (2 of 2)]
[im 1/39]
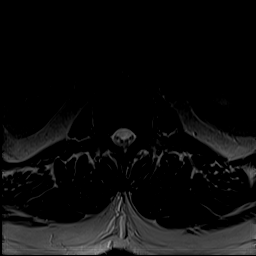
[im 6/39]
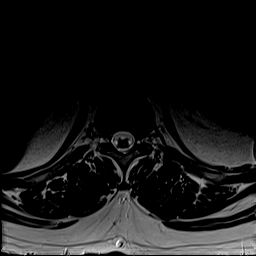
[im 11/39]
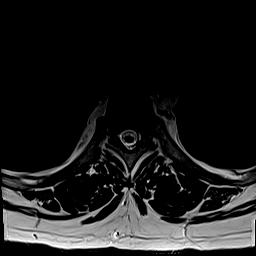
[im 17/39]
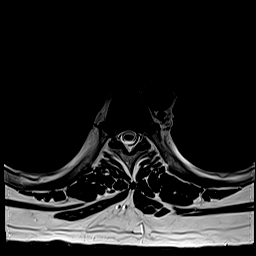
[im 22/39]
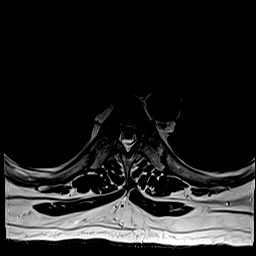
[im 28/39]
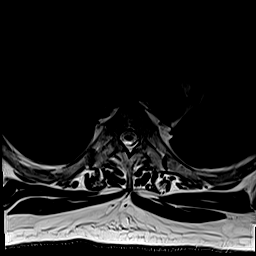
[im 33/39]
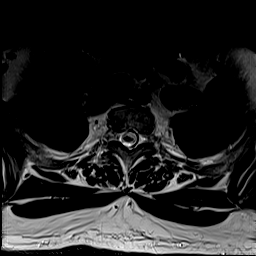
[im 39/39]
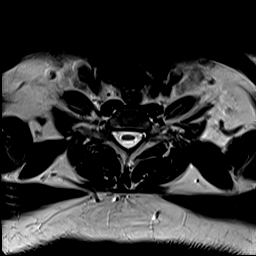

[Series 25: T1 · axial · non-contrast · 5.0mm · 0.37mm/px · z∈[-338,-85]mm · 8 of 39 slices shown (3 of 3)]
[im 1/39]
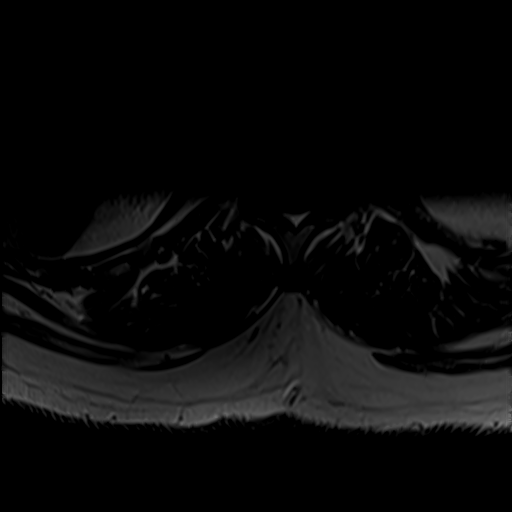
[im 6/39]
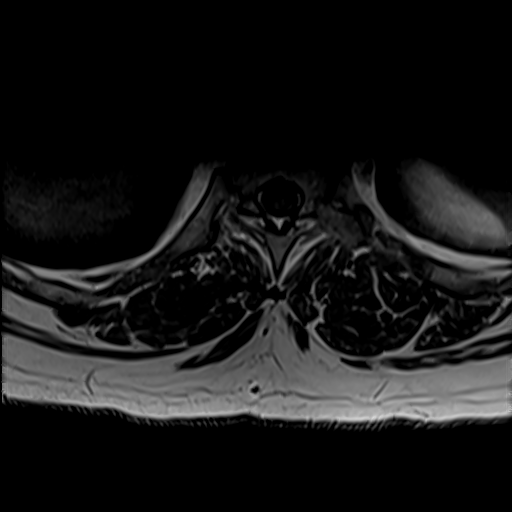
[im 11/39]
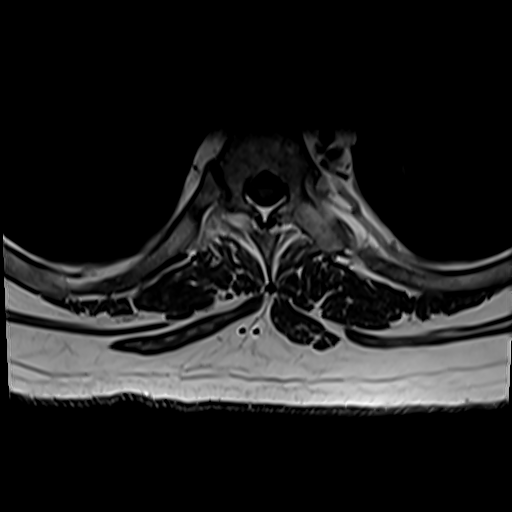
[im 17/39]
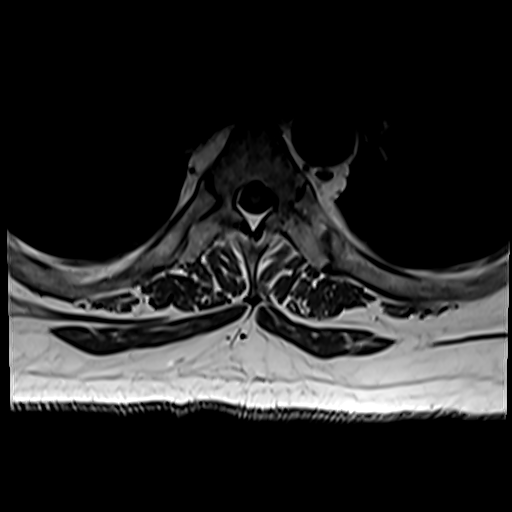
[im 22/39]
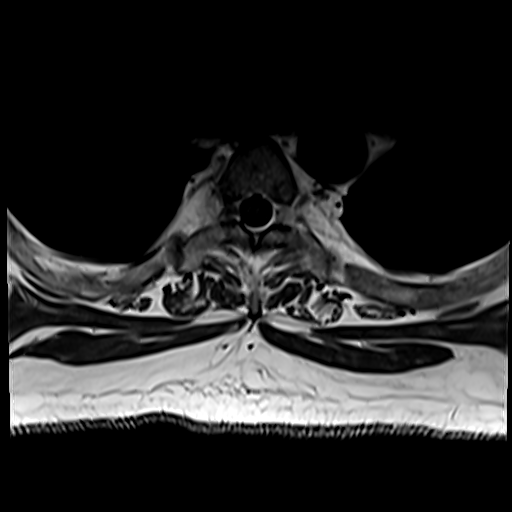
[im 28/39]
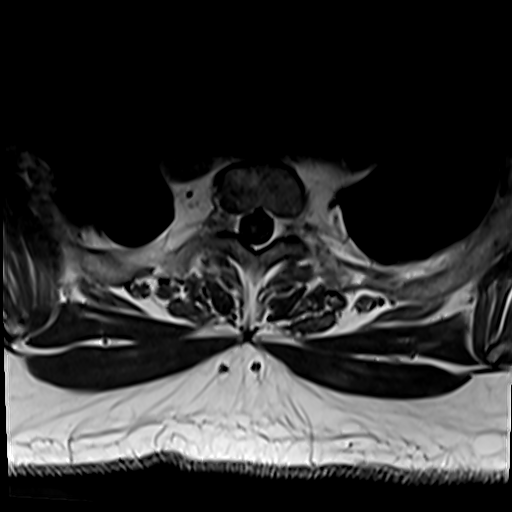
[im 33/39]
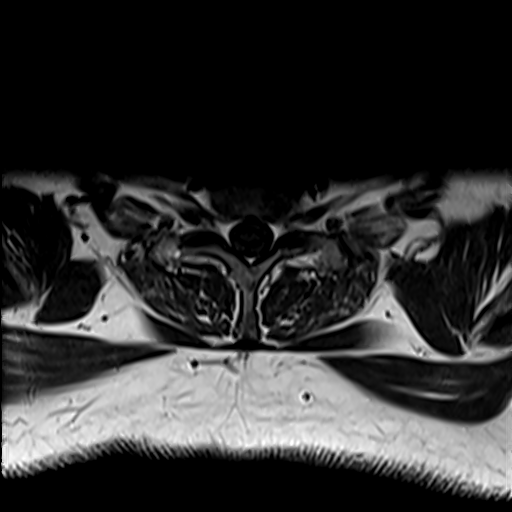
[im 39/39]
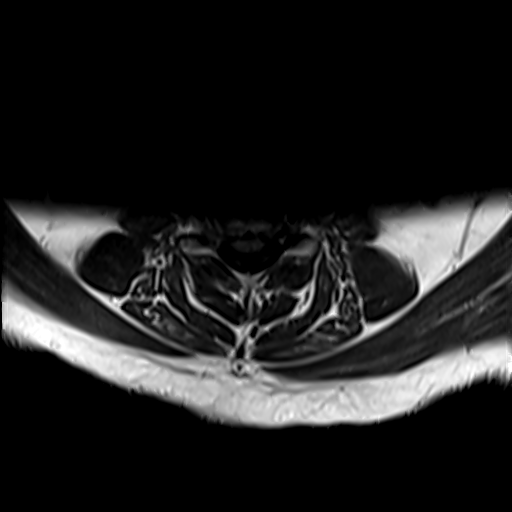

[Series 26: T1 fat-sat post-contrast · sagittal · 3.0mm · 1.06mm/px · 4 of 17 slices shown]
[im 1/17]
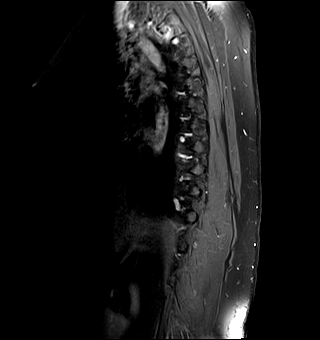
[im 6/17]
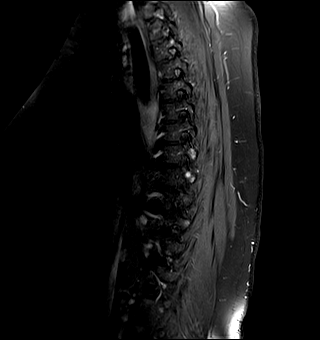
[im 11/17]
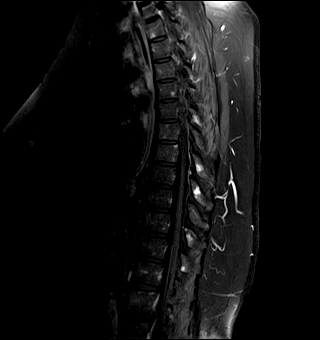
[im 17/17]
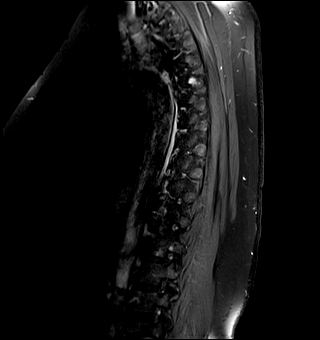

[30 of 48 positions shown; findings below may reference images not displayed]

CT chest, abdomen, and pelvis
[DATE]. MRI lumbar spine [DATE]. MRI pelvis [DATE].
FINDINGS: Alignment:  Normal.

Vertebrae: No fracture. Numerous STIR hyperintense, enhancing marrow
lesions throughout the vertebral bodies and posterior elements of
the thoracic spine as well as in the L1 spinous process. The largest
lesions in the thoracic spine measure proximally 1.5 cm in the T1,
T3, and T5 spinous processes. No extraosseous tumor is evident.

Cord: Normal cord signal and morphology. No abnormal intradural
enhancement.

Paraspinal and other soft tissues: 2 cm T2 hyperintense lesion in
the liver corresponding to the possible hemangioma described on the
prior abdominal CT. Partially visualized lymphadenopathy in the
chest and upper abdomen. Partially visualized left renal cyst.

Disc levels:

No sizable disc herniation or stenosis.
IMPRESSION: 1. Numerous enhancing marrow lesions throughout the thoracic spine.
No epidural tumor.
2. Normal appearance of the thoracic spinal cord.
3. No disc herniation or stenosis.

## 2021-11-02 MED ORDER — GADOBUTROL 1 MMOL/ML IV SOLN
10.0000 mL | Freq: Once | INTRAVENOUS | Status: AC | PRN
  Administered 2021-11-02: 10 mL via INTRAVENOUS

## 2021-11-03 ENCOUNTER — Inpatient Hospital Stay (HOSPITAL_BASED_OUTPATIENT_CLINIC_OR_DEPARTMENT_OTHER): Payer: 59 | Admitting: Hematology

## 2021-11-03 VITALS — BP 158/97 | HR 97 | Temp 97.7°F | Resp 20 | Ht 67.0 in | Wt 281.9 lb

## 2021-11-03 DIAGNOSIS — R591 Generalized enlarged lymph nodes: Secondary | ICD-10-CM | POA: Diagnosis not present

## 2021-11-03 DIAGNOSIS — R29898 Other symptoms and signs involving the musculoskeletal system: Secondary | ICD-10-CM

## 2021-11-03 NOTE — Progress Notes (Signed)
James Hartman, [place] [address]   CLINIC:  Medical Oncology/Hematology  PCP:  [facility] / [place] [address] [phone]   REASON FOR VISIT:  Follow-up for generalized lymphadenopathy  PRIOR THERAPY: none  NGS Results: not done  CURRENT THERAPY: under work-up  BRIEF ONCOLOGIC HISTORY:  Oncology History   No history exists.    CANCER STAGING: Cancer Staging  No matching staging information was found for the patient.  INTERVAL HISTORY:  Mr. James Hartman, a 46 y.o. male, returns for routine follow-up of his generalized lymphadenopathy. Harrell was last seen on 10/22/2021.   Today he reports feeling good. He denies back pain. He continues to have bilateral leg pain. He denies family history of sarcoidosis.   REVIEW OF SYSTEMS:  Review of Systems  Constitutional:  Negative for appetite change and fatigue.  HENT:   Positive for trouble swallowing.   Respiratory:  Positive for shortness of breath.   Cardiovascular:  Positive for leg swelling.  Gastrointestinal:  Positive for constipation.  Genitourinary:  Positive for bladder incontinence.   Musculoskeletal:  Positive for arthralgias (6/10 legs).  Neurological:  Positive for numbness.  Psychiatric/Behavioral:  Positive for sleep disturbance.   All other systems reviewed and are negative.  PAST MEDICAL/SURGICAL HISTORY:  Past Medical History:  Diagnosis Date   Hypertension    No past surgical history on file.  SOCIAL HISTORY:  Social History   Socioeconomic History   Marital status: Single    Spouse name: Not on file   Number of children: Not on file   Years of education: Not on file   Highest education level: Not on file  Occupational History   Not on file  Tobacco Use   Smoking status: Former   Smokeless tobacco: Never  Substance and Sexual Activity   Alcohol use: Not Currently   Drug use: No   Sexual activity: Not on  file  Other Topics Concern   Not on file  Social History Narrative   Not on file   Social Determinants of Health   Financial Resource Strain: Not on file  Food Insecurity: Not on file  Transportation Needs: Not on file  Physical Activity: Not on file  Stress: Not on file  Social Connections: Not on file  Intimate Partner Violence: Not on file    FAMILY HISTORY:  No family history on file.  CURRENT MEDICATIONS:  Current Outpatient Medications  Medication Sig Dispense Refill   azithromycin (ZITHROMAX) 250 MG tablet Take 1 tablet (250 mg total) by mouth daily. Take first 2 tablets together, then 1 every day until finished. 6 tablet 0   FLOVENT HFA 220 MCG/ACT inhaler Inhale into the lungs.     fluticasone (FLONASE) 50 MCG/ACT nasal spray Place into both nostrils.     folic acid (FOLVITE) 1 MG tablet Take 1 mg by mouth daily.     gabapentin (NEURONTIN) 300 MG capsule Take 300 mg by mouth 3 (three) times daily.     hydrochlorothiazide (HYDRODIURIL) 12.5 MG tablet 1 tablet     ibuprofen (ADVIL) 200 MG tablet Take 400 mg by mouth every 6 (six) hours as needed for mild pain.     pravastatin (PRAVACHOL) 40 MG tablet Take 40 mg by mouth daily.     predniSONE (DELTASONE) 10 MG tablet 6 tablets for 2 days, 5 tablets for 2 days, 4 tablets for 2 days, 3 tablets for 2 days, 2 tablets for  2 days, 1 tablet for 2 days     valsartan (DIOVAN) 80 MG tablet Take 80 mg by mouth daily.     No current facility-administered medications for this visit.    ALLERGIES:  No Known Allergies  PHYSICAL EXAM:  Performance status (ECOG): 1 - Symptomatic but completely ambulatory  There were no vitals filed for this visit. Wt Readings from Last 3 Encounters:  10/22/21 281 lb 8 oz (127.7 kg)  02/17/21 280 lb (127 kg)  11/11/15 220 lb (99.8 kg)   Physical Exam Vitals reviewed.  Constitutional:      Appearance: Normal appearance. He is obese.  Cardiovascular:     Rate and Rhythm: Normal rate and  regular rhythm.     Pulses: Normal pulses.     Heart sounds: Normal heart sounds.  Pulmonary:     Effort: Pulmonary effort is normal.     Breath sounds: Normal breath sounds.  Neurological:     General: No focal deficit present.     Mental Status: He is alert and oriented to person, place, and time.  Psychiatric:        Mood and Affect: Mood normal.        Behavior: Behavior normal.     LABORATORY DATA:  I have reviewed the labs as listed.  CBC Latest Ref Rng & Units 10/22/2021 02/17/2021  WBC 4.0 - 10.5 K/uL 4.6 3.9(L)  Hemoglobin 13.0 - 17.0 g/dL 12.3(L) 12.8(L)  Hematocrit 39.0 - 52.0 % 38.5(L) 39.1  Platelets 150 - 400 K/uL 226 218   CMP Latest Ref Rng & Units 10/22/2021 10/19/2021 02/17/2021  Glucose 70 - 99 mg/dL 131(H) - 100(H)  BUN 6 - 20 mg/dL 12 - 17  Creatinine 0.61 - 1.24 mg/dL 0.92 1.10 0.95  Sodium 135 - 145 mmol/L 135 - 140  Potassium 3.5 - 5.1 mmol/L 4.0 - 3.5  Chloride 98 - 111 mmol/L 101 - 106  CO2 22 - 32 mmol/L 22 - 28  Calcium 8.9 - 10.3 mg/dL 8.8(L) - 8.8(L)  Total Protein 6.5 - 8.1 g/dL 7.8 - 7.2  Total Bilirubin 0.3 - 1.2 mg/dL 0.8 - 0.9  Alkaline Phos 38 - 126 U/L 87 - 86  AST 15 - 41 U/L 17 - 15  ALT 0 - 44 U/L 12 - 13    DIAGNOSTIC IMAGING:  I have independently reviewed the scans and discussed with the patient. MR Thoracic Spine W Wo Contrast  Result Date: 11/03/2021 CLINICAL DATA:  Bilateral lower extremity weakness and numbness. Repeated falls. Lymphadenopathy and bone lesions on previous imaging. EXAM: MRI THORACIC WITHOUT AND WITH CONTRAST TECHNIQUE: Multiplanar and multiecho pulse sequences of the thoracic spine were obtained without and with intravenous contrast. CONTRAST:  75m GADAVIST GADOBUTROL 1 MMOL/ML IV SOLN COMPARISON:  PET-CT 10/29/2021. CT chest, abdomen, and pelvis 10/19/2021. MRI lumbar spine 09/29/2021. MRI pelvis 10/09/2021. FINDINGS: Alignment:  Normal. Vertebrae: No fracture. Numerous STIR hyperintense, enhancing marrow lesions  throughout the vertebral bodies and posterior elements of the thoracic spine as well as in the L1 spinous process. The largest lesions in the thoracic spine measure proximally 1.5 cm in the T1, T3, and T5 spinous processes. No extraosseous tumor is evident. Cord: Normal cord signal and morphology. No abnormal intradural enhancement. Paraspinal and other soft tissues: 2 cm T2 hyperintense lesion in the liver corresponding to the possible hemangioma described on the prior abdominal CT. Partially visualized lymphadenopathy in the chest and upper abdomen. Partially visualized left renal cyst. Disc levels: No sizable  disc herniation or stenosis. IMPRESSION: 1. Numerous enhancing marrow lesions throughout the thoracic spine. No epidural tumor. 2. Normal appearance of the thoracic spinal cord. 3. No disc herniation or stenosis. Electronically Signed   By: Logan Bores M.D.   On: 11/03/2021 08:50   MR PELVIS W WO CONTRAST  Result Date: 10/09/2021 CLINICAL DATA:  Marrow edema in the bilateral sacral ala on recent lumbar spine MRI. Concern for infiltrative marrow process. EXAM: MRI PELVIS WITHOUT AND WITH CONTRAST TECHNIQUE: Multiplanar multisequence MR imaging of the pelvis was performed both before and after administration of intravenous contrast. CONTRAST:  101m GADAVIST GADOBUTROL 1 MMOL/ML IV SOLN COMPARISON:  Lumbar spine MRI 09/29/2021 FINDINGS: Urinary Tract:  No abnormality visualized. Bowel:  Unremarkable visualized pelvic bowel loops. Vascular/Lymphatic: There are enlarged external iliac lymph nodes, measuring up to 1.2 cm on the right and 1.3 cm on left (axial T1 images 12 and 14). Partially visualized enlarged retroperitoneal lymph nodes, as seen on recent lumbar spine MRI. Reproductive:  Unremarkable Other:  None Musculoskeletal: There are multiple T2 hyperintense/T1 hypointense enhancing lesions throughout the sacrum/pelvis and proximal femurs proximal femurs. There also tiny lesions in the L4 and L5  vertebral bodies and spinous process of L5. These lesions have irregular margins and internal heterogeneity. There is no evidence of acute fracture. There is no significant SI joint arthritis or evidence of sacroiliitis. IMPRESSION: Multiple T2 hyperintense/T1 hypointense enhancing lesions throughout the pelvis and proximal femurs, and tiny lesions in the L4 and L5 vertebral bodies and L5 spinous process. Enlarged retroperitoneal and bilateral external iliac lymph nodes. Findings are highly suspicious for lymphoproliferative process or metastatic disease. Recommend malignancy workup with hematology/oncology referral and likely CT of the chest, abdomen, and pelvis. These results were called by telephone on 10/09/2021 at 10:11 am to provider BTheodosia Blender RN, who verbally acknowledged these results. Per discussion, this information will be relayed to the ordering provider. Electronically Signed   By: JMaurine SimmeringM.D.   On: 10/09/2021 10:19   CT CHEST ABDOMEN PELVIS W CONTRAST  Result Date: 10/19/2021 CLINICAL DATA:  Adenopathy with multifocal areas of marrow edema and enhancement in the bony pelvis and proximal femurs on MRI imaging EXAM: CT CHEST, ABDOMEN, AND PELVIS WITH CONTRAST TECHNIQUE: Multidetector CT imaging of the chest, abdomen and pelvis was performed following the standard protocol during bolus administration of intravenous contrast. RADIATION DOSE REDUCTION: This exam was performed according to the departmental dose-optimization program which includes automated exposure control, adjustment of the mA and/or kV according to patient size and/or use of iterative reconstruction technique. CONTRAST:  106mOMNIPAQUE IOHEXOL 300 MG/ML  SOLN COMPARISON:  MRI examinations from 10/09/2021 and 09/29/2021 FINDINGS: CT CHEST FINDINGS Cardiovascular: Unremarkable Mediastinum/Nodes: Bilateral supraclavicular, prevascular, right and left paratracheal, subcarinal, axillary, bilateral hilar, and lower paraesophageal  adenopathy observed. Right paratracheal node 1.7 cm in short axis on image 13 series 2. Subcarinal adenopathy 1.9 cm in short axis, image 28 series 2. Right hilar node with some faint internal calcification measures 1.8 cm in short axis image 25 series 2. Lungs/Pleura: Scattered small indistinct peribronchovascular lymph nodes are observed bilaterally, index nodule in the right lower lobe 5 by 6 mm on image 73 of series 3. Most of these are somewhat ill-defined. Mild nodularity along the fissures bilaterally. No large or dominant pulmonary nodule is identified. Musculoskeletal: Unremarkable CT ABDOMEN PELVIS FINDINGS Hepatobiliary: 2.4 by 2.0 cm enhancing lesion in the right hepatic lobe on image 49 series 2 has a small component of lesser enhancement  compared to the rest of the lesion. This lesion is nonspecific and could represent a hemangioma or other lesion. It does appear to have diffuse delayed enhancement which would tend to favor a hemangioma. Gallbladder unremarkable. No biliary dilatation. Pancreas: Unremarkable Spleen: Unremarkable.  No splenomegaly. Adrenals/Urinary Tract: 4.8 by 4.0 by 3.7 cm fluid density left mid kidney lesion favoring simple cyst. Unremarkable Stomach/Bowel: Prominent stool throughout the colon favors constipation. Vascular/Lymphatic: Atherosclerosis is present, including aortoiliac atherosclerotic disease. There is pathologic right gastric, peripancreatic, porta hepatis, retroperitoneal, and pelvic adenopathy. Index portacaval node 2.2 cm in short axis, image 57 series 2. Index aortocaval node 1.4 cm in short axis, image 81 series 2. Index right external iliac node 1.4 cm in short axis, image 113 series 2. Reproductive: Unremarkable Other: No supplemental non-categorized findings. Musculoskeletal: Bridging spurring of the left SI joint superiorly. The bone marrow lesions shown in the bony pelvis and proximal femurs on prior MRI are fairly occult on CT. IMPRESSION: 1. Abnormal  adenopathy in the chest, abdomen, and pelvis without an obvious primary lesion. The appearance is suspicious for lymphoproliferative disease such as lymphoma. The bony involvement seen on MRI is not readily apparent on CT suggesting infiltrative marrow process. Oncology referral recommended. 2. No overt splenomegaly or focal splenic lesions identified. 3. There is some scattered small pulmonary nodules measuring up to about 5 by 6 mm. These could be related to lymphoproliferative disease directly or may be infectious/inflammatory. Possibility of sarcoidosis is a cause for thoracic adenopathy and the small pulmonary nodules is raised, but the degree of adenopathy in the abdomen/pelvis would be unusual for sarcoidosis. 4.  Prominent stool throughout the colon favors constipation. 5.  Aortic Atherosclerosis (ICD10-I70.0). Electronically Signed   By: Van Clines M.D.   On: 10/19/2021 11:55   NM PET Image Initial (PI) Skull Base To Thigh (F-18 FDG)  Result Date: 11/01/2021 CLINICAL DATA:  Initial treatment strategy for initial staging of lymphoma. EXAM: NUCLEAR MEDICINE PET SKULL BASE TO THIGH TECHNIQUE: 15.0 mCi F-18 FDG was injected intravenously. Full-ring PET imaging was performed from the skull base to thigh after the radiotracer. CT data was obtained and used for attenuation correction and anatomic localization. Fasting blood glucose: 107 mg/dl COMPARISON:  Chest abdomen and pelvic CTs of 10/19/2021 FINDINGS: Mediastinal blood pool activity: SUV max 2.8 Liver activity: SUV max 3.7 NECK: Bilateral hypermetabolic cervical nodes. An index left level 2 node measures 9 mm and a S.U.V. max of 6.1 on 35/3. Incidental CT findings: Right greater than left maxillary sinus mucosal thickening. Ethmoid air cell mucosal thickening. CHEST: Hypermetabolism corresponding to bilateral axillary and subpectoral nodes. An index left subpectoral node measures 11 mm and a S.U.V. max of 9.1 on 66/3. Hilar and mediastinal nodal  hypermetabolism. Index right hilar node measures 2.0 cm and a S.U.V. max of 10.1 on 76/3. No pulmonary parenchymal hypermetabolism. Incidental CT findings: Mild cardiomegaly. Scattered bilateral pulmonary nodules are below PET resolution. Nodules are identified along the fissures, suggesting a Perilymphatic distribution. ABDOMEN/PELVIS: Abdominopelvic hypermetabolic adenopathy. Index porta hepatis nodal mass of 2.1 cm and a S.U.V. max of 5.9 on 118/3. A celiac node measures 1.6 cm and a S.U.V. max of 5.3 on 118/3. Index right external iliac node measures 1.4 cm and a S.U.V. max of 5.0 on 183/3. No splenic hypermetabolism. Incidental CT findings: Normal adrenal glands. Interpolar left renal cyst of 4.1 cm. Abdominal aortic atherosclerosis. Moderate left-sided hydrocele. SKELETON: Relatively diffuse, heterogeneous marrow hypermetabolism. Example within the sternal manubrium at a S.U.V. max  of 6.5. Within the left iliac wing, CT occult at a S.U.V. max of 5.4. Incidental CT findings: none IMPRESSION: 1. Hypermetabolic adenopathy throughout the neck, chest, abdomen, and pelvis. Most consistent with active lymphoma. ([place]) 5 2. Heterogeneous marrow hypermetabolism, also related to active lymphoma. 3. Pulmonary nodularity in a perilymphatic distribution. Below PET resolution with differential considerations of pulmonary lymphoma or concurrent sarcoidosis. 4. Incidental findings, including: Aortic Atherosclerosis (ICD10-I70.0). Moderate left hydrocele. Sinus disease. Electronically Signed   By: Abigail Miyamoto M.D.   On: 11/01/2021 19:43     ASSESSMENT:  Generalized lymphadenopathy: - MRI of the lumbar spine on 09/29/2021: Bone marrow edema in the bilateral sacral ala, to evaluate for bone marrow infiltrative process.  Multiple enlarged retroperitoneal lymph nodes. - CT CAP on 10/19/2021 shows adenopathy in the chest, abdomen and pelvis without obvious primary lesion.  Bone involvement seen on MRI not seen on CT.  No  overt splenomegaly.  Some scattered small pulmonary nodules measuring up to 5 to 6 mm. - Denies any fevers, night sweats or weight loss. - Reports loss of urinary and bladder control, numbness in the legs and toes, falling, for the last 1 year.  Evaluated by neurology.    Social/family history: - He lives with his fiance.  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  Non-smoker. - Mother died of cancer.  Maternal grandfather had prostate cancer.  Maternal aunt had breast cancer.   PLAN:  Generalized lymphadenopathy: - ACE level was minimally elevated at 89.  ESR was 45 and CRP 1.5.  LDH was normal at 133.  Beta-2 microglobulin was minimally elevated at 3.1. - We have reviewed PET scan from 10/29/2021 which showed hypermetabolic adenopathy throughout the neck, chest, abdomen and pelvis.  Heterogeneous marrow hypermetabolism also related to active myeloma.  Pulmonary nodularity below PET resolution. - We discussed the differential diagnosis of lymphoma versus sarcoidosis. - I have recommended biopsy of the axillary lymph nodes which have an SUV around 9.  All of his lymph node areas have SUV less than 10. - Recommend RTC after biopsy.  If lymphoma is confirmed, he will require port placement.  2.  Lower extremity weakness: - We have reviewed MRI of the thoracic spine dated 11/02/2021.  Normal appearance of thoracic spinal cord with no epidural tumor.  However numerous enhancing marrow lesions throughout the thoracic spine.  No disc herniations or stenosis. - Multiple myeloma work-up showed M spike undetected.  Immunofixation shows polyclonal.  Free light chain ratio is increased to 1.83.   Orders placed this encounter:  No orders of the defined types were placed in this encounter.    Derek Jack, MD [place] [phone]   I, Thana Ates, am acting as a scribe for Dr. Derek Jack.  I, Derek Jack MD, have  reviewed the above documentation for accuracy and completeness, and I agree with the above.

## 2021-11-03 NOTE — Patient Instructions (Signed)
[facility] at [facility] Discharge Instructions   You were seen and examined today by Dr. Ellin Saba.  He reviewed the results of your PET scan and MRI.  It is likely that you have lymphoma.  We will need to get a biopsy of one of your lymph nodes to confirm this. We will refer you to a local surgeon for this.  We will see you back after the biopsy to discuss results.    Thank you for choosing [facility] at [facility] to provide your oncology and hematology care.  To afford each patient quality time with our provider, please arrive at least 15 minutes before your scheduled appointment time.   If you have a lab appointment with the Cancer Center please come in thru the Main Entrance and check in at the main information desk.  You need to re-schedule your appointment should you arrive 10 or more minutes late.  We strive to give you quality time with our providers, and arriving late affects you and other patients whose appointments are after yours.  Also, if you no show three or more times for appointments you may be dismissed from the clinic at the providers discretion.     Again, thank you for choosing [facility].  Our hope is that these requests will decrease the amount of time that you wait before being seen by our physicians.       _____________________________________________________________  Should you have questions after your visit to [facility], please contact our office at [phone] and follow the prompts.  Our office hours are 8:00 a.m. and 4:30 p.m. Monday - Friday.  Please note that voicemails left after 4:00 p.m. may not be returned until the following business day.  We are closed weekends and major holidays.  You do have access to a nurse 24-7, just call the main number to the clinic [phone] and do not press any options, hold on the line and a nurse will answer the phone.    For prescription refill  requests, have your pharmacy contact our office and allow 72 hours.    Due to Covid, you will need to wear a mask upon entering the hospital. If you do not have a mask, a mask will be given to you at the Main Entrance upon arrival. For doctor visits, patients may have 1 support person age 74 or older with them. For treatment visits, patients can not have anyone with them due to social distancing guidelines and our immunocompromised population.

## 2021-11-06 ENCOUNTER — Other Ambulatory Visit (HOSPITAL_COMMUNITY): Payer: Medicaid Other

## 2021-11-06 ENCOUNTER — Encounter (HOSPITAL_COMMUNITY): Payer: Self-pay

## 2021-11-11 ENCOUNTER — Other Ambulatory Visit: Payer: Self-pay

## 2021-11-11 ENCOUNTER — Encounter: Payer: Self-pay | Admitting: General Surgery

## 2021-11-11 ENCOUNTER — Ambulatory Visit (INDEPENDENT_AMBULATORY_CARE_PROVIDER_SITE_OTHER): Payer: Medicaid Other | Admitting: General Surgery

## 2021-11-11 VITALS — BP 143/92 | HR 101 | Temp 98.7°F | Resp 16 | Ht 68.0 in | Wt 282.0 lb

## 2021-11-11 DIAGNOSIS — R591 Generalized enlarged lymph nodes: Secondary | ICD-10-CM

## 2021-11-11 NOTE — Progress Notes (Signed)
James Hartman; [id number]; July 10, 1976 ? ? ?HPI ?Patient is a 46 year old black male who was referred to my care by Dr. Ellin Saba of oncology for a lymph node biopsy.  Patient has generalized lymphadenopathy and they need tissue for diagnosis.  Possible diagnoses include lymphoma and sarcoidosis. ?Past Medical History:  ?Diagnosis Date  ? Hypertension   ? ? ?History reviewed. No pertinent surgical history. ? ?History reviewed. No pertinent family history. ? ?Current Outpatient Medications on File Prior to Visit  ?Medication Sig Dispense Refill  ? FLOVENT HFA 220 MCG/ACT inhaler Inhale into the lungs.    ? fluticasone (FLONASE) 50 MCG/ACT nasal spray Place into both nostrils.    ? folic acid (FOLVITE) 1 MG tablet Take 1 mg by mouth daily.    ? gabapentin (NEURONTIN) 300 MG capsule Take 300 mg by mouth 3 (three) times daily.    ? hydrochlorothiazide (HYDRODIURIL) 12.5 MG tablet 1 tablet    ? ibuprofen (ADVIL) 200 MG tablet Take 400 mg by mouth every 6 (six) hours as needed for mild pain.    ? pravastatin (PRAVACHOL) 40 MG tablet Take 40 mg by mouth daily.    ? valsartan (DIOVAN) 80 MG tablet Take 80 mg by mouth daily.    ? azithromycin (ZITHROMAX) 250 MG tablet Take 1 tablet (250 mg total) by mouth daily. Take first 2 tablets together, then 1 every day until finished. (Patient not taking: Reported on 11/11/2021) 6 tablet 0  ? ?No current facility-administered medications on file prior to visit.  ? ? ?No Known Allergies ? ?Social History  ? ?Substance and Sexual Activity  ?Alcohol Use Not Currently  ? ? ?Social History  ? ?Tobacco Use  ?Smoking Status Former  ?Smokeless Tobacco Never  ? ? ?Review of Systems  ?Constitutional:  Positive for malaise/fatigue.  ?HENT:  Positive for sinus pain.   ?Eyes:  Positive for blurred vision.  ?Respiratory:  Positive for wheezing.   ?Cardiovascular: Negative.   ?Gastrointestinal:  Positive for abdominal pain and nausea.  ?Genitourinary:  Positive for frequency and urgency.   ?Musculoskeletal:  Positive for joint pain.  ?Skin: Negative.   ?Neurological: Negative.   ?Endo/Heme/Allergies: Negative.   ?Psychiatric/Behavioral: Negative.    ? ?Objective  ? ?Vitals:  ? 11/11/21 1446  ?BP: (!) 143/92  ?Pulse: (!) 101  ?Resp: 16  ?Temp: 98.7 ?F (37.1 ?C)  ?SpO2: 96%  ? ? ?Physical Exam ?Vitals reviewed.  ?Constitutional:   ?   Appearance: Normal appearance. He is obese. He is not ill-appearing.  ?HENT:  ?   Head: Normocephalic and atraumatic.  ?Cardiovascular:  ?   Rate and Rhythm: Normal rate and regular rhythm.  ?   Heart sounds: Normal heart sounds. No murmur heard. ?  No friction rub. No gallop.  ?Pulmonary:  ?   Effort: Pulmonary effort is normal. No respiratory distress.  ?   Breath sounds: Normal breath sounds. No stridor. No wheezing, rhonchi or rales.  ?Skin: ?   General: Skin is warm and dry.  ?Neurological:  ?   Mental Status: He is alert and oriented to person, place, and time.  ?Axilla: Palpable node noted in the right axilla.  Shotty lymphadenopathy noted in the left axilla. ? ?CT scan of chest reviewed ? ?Assessment  ?Generalized lymphadenopathy of unknown etiology ?Plan  ?Patient is scheduled for right axillary lymph node biopsy on 11/16/2021.  The risks and benefits of the procedure including bleeding, infection, and the possibility of pain were fully explained to the patient, who gave  informed consent. ?

## 2021-11-11 NOTE — H&P (Signed)
James Hartman; [id number]; July 10, 1976 ? ? ?HPI ?Patient is a 46 year old black male who was referred to my care by Dr. Ellin Saba of oncology for a lymph node biopsy.  Patient has generalized lymphadenopathy and they need tissue for diagnosis.  Possible diagnoses include lymphoma and sarcoidosis. ?Past Medical History:  ?Diagnosis Date  ? Hypertension   ? ? ?History reviewed. No pertinent surgical history. ? ?History reviewed. No pertinent family history. ? ?Current Outpatient Medications on File Prior to Visit  ?Medication Sig Dispense Refill  ? FLOVENT HFA 220 MCG/ACT inhaler Inhale into the lungs.    ? fluticasone (FLONASE) 50 MCG/ACT nasal spray Place into both nostrils.    ? folic acid (FOLVITE) 1 MG tablet Take 1 mg by mouth daily.    ? gabapentin (NEURONTIN) 300 MG capsule Take 300 mg by mouth 3 (three) times daily.    ? hydrochlorothiazide (HYDRODIURIL) 12.5 MG tablet 1 tablet    ? ibuprofen (ADVIL) 200 MG tablet Take 400 mg by mouth every 6 (six) hours as needed for mild pain.    ? pravastatin (PRAVACHOL) 40 MG tablet Take 40 mg by mouth daily.    ? valsartan (DIOVAN) 80 MG tablet Take 80 mg by mouth daily.    ? azithromycin (ZITHROMAX) 250 MG tablet Take 1 tablet (250 mg total) by mouth daily. Take first 2 tablets together, then 1 every day until finished. (Patient not taking: Reported on 11/11/2021) 6 tablet 0  ? ?No current facility-administered medications on file prior to visit.  ? ? ?No Known Allergies ? ?Social History  ? ?Substance and Sexual Activity  ?Alcohol Use Not Currently  ? ? ?Social History  ? ?Tobacco Use  ?Smoking Status Former  ?Smokeless Tobacco Never  ? ? ?Review of Systems  ?Constitutional:  Positive for malaise/fatigue.  ?HENT:  Positive for sinus pain.   ?Eyes:  Positive for blurred vision.  ?Respiratory:  Positive for wheezing.   ?Cardiovascular: Negative.   ?Gastrointestinal:  Positive for abdominal pain and nausea.  ?Genitourinary:  Positive for frequency and urgency.   ?Musculoskeletal:  Positive for joint pain.  ?Skin: Negative.   ?Neurological: Negative.   ?Endo/Heme/Allergies: Negative.   ?Psychiatric/Behavioral: Negative.    ? ?Objective  ? ?Vitals:  ? 11/11/21 1446  ?BP: (!) 143/92  ?Pulse: (!) 101  ?Resp: 16  ?Temp: 98.7 ?F (37.1 ?C)  ?SpO2: 96%  ? ? ?Physical Exam ?Vitals reviewed.  ?Constitutional:   ?   Appearance: Normal appearance. He is obese. He is not ill-appearing.  ?HENT:  ?   Head: Normocephalic and atraumatic.  ?Cardiovascular:  ?   Rate and Rhythm: Normal rate and regular rhythm.  ?   Heart sounds: Normal heart sounds. No murmur heard. ?  No friction rub. No gallop.  ?Pulmonary:  ?   Effort: Pulmonary effort is normal. No respiratory distress.  ?   Breath sounds: Normal breath sounds. No stridor. No wheezing, rhonchi or rales.  ?Skin: ?   General: Skin is warm and dry.  ?Neurological:  ?   Mental Status: He is alert and oriented to person, place, and time.  ?Axilla: Palpable node noted in the right axilla.  Shotty lymphadenopathy noted in the left axilla. ? ?CT scan of chest reviewed ? ?Assessment  ?Generalized lymphadenopathy of unknown etiology ?Plan  ?Patient is scheduled for right axillary lymph node biopsy on 11/16/2021.  The risks and benefits of the procedure including bleeding, infection, and the possibility of pain were fully explained to the patient, who gave  informed consent. ?

## 2021-11-13 ENCOUNTER — Encounter (HOSPITAL_COMMUNITY): Payer: Self-pay

## 2021-11-13 ENCOUNTER — Encounter (HOSPITAL_COMMUNITY)
Admission: RE | Admit: 2021-11-13 | Discharge: 2021-11-13 | Disposition: A | Discharge status: Home / Self Care | Payer: Medicaid Other | Source: Ambulatory Visit | Attending: General Surgery | Admitting: General Surgery

## 2021-11-13 HISTORY — DX: Sleep apnea, unspecified: G47.30

## 2021-11-16 ENCOUNTER — Encounter (HOSPITAL_COMMUNITY)
Admission: RE | Disposition: A | Discharge status: Home / Self Care | Payer: Self-pay | Source: Home / Self Care | Attending: General Surgery

## 2021-11-16 ENCOUNTER — Ambulatory Visit (HOSPITAL_BASED_OUTPATIENT_CLINIC_OR_DEPARTMENT_OTHER): Payer: No Typology Code available for payment source | Admitting: Certified Registered"

## 2021-11-16 ENCOUNTER — Ambulatory Visit (HOSPITAL_BASED_OUTPATIENT_CLINIC_OR_DEPARTMENT_OTHER)
Admission: RE | Admit: 2021-11-16 | Discharge: 2021-11-16 | Disposition: A | Discharge status: Home / Self Care | Payer: No Typology Code available for payment source | Source: Home / Self Care | Attending: General Surgery | Admitting: General Surgery

## 2021-11-16 ENCOUNTER — Ambulatory Visit (HOSPITAL_COMMUNITY): Payer: No Typology Code available for payment source | Admitting: Certified Registered"

## 2021-11-16 ENCOUNTER — Other Ambulatory Visit: Payer: Self-pay

## 2021-11-16 ENCOUNTER — Encounter (HOSPITAL_COMMUNITY): Payer: Self-pay | Admitting: General Surgery

## 2021-11-16 DIAGNOSIS — R591 Generalized enlarged lymph nodes: Secondary | ICD-10-CM

## 2021-11-16 DIAGNOSIS — Z79899 Other long term (current) drug therapy: Secondary | ICD-10-CM | POA: Insufficient documentation

## 2021-11-16 DIAGNOSIS — I889 Nonspecific lymphadenitis, unspecified: Secondary | ICD-10-CM | POA: Insufficient documentation

## 2021-11-16 DIAGNOSIS — A419 Sepsis, unspecified organism: Secondary | ICD-10-CM | POA: Diagnosis not present

## 2021-11-16 DIAGNOSIS — G4730 Sleep apnea, unspecified: Secondary | ICD-10-CM | POA: Insufficient documentation

## 2021-11-16 DIAGNOSIS — Z6841 Body Mass Index (BMI) 40.0 and over, adult: Secondary | ICD-10-CM | POA: Insufficient documentation

## 2021-11-16 DIAGNOSIS — I10 Essential (primary) hypertension: Secondary | ICD-10-CM | POA: Insufficient documentation

## 2021-11-16 DIAGNOSIS — Z7951 Long term (current) use of inhaled steroids: Secondary | ICD-10-CM | POA: Insufficient documentation

## 2021-11-16 DIAGNOSIS — Z79891 Long term (current) use of opiate analgesic: Secondary | ICD-10-CM | POA: Insufficient documentation

## 2021-11-16 DIAGNOSIS — R4182 Altered mental status, unspecified: Secondary | ICD-10-CM | POA: Diagnosis not present

## 2021-11-16 SURGERY — AXILLARY LYMPH NODE BIOPSY
Anesthesia: General | Site: Axilla | Laterality: Right

## 2021-11-16 MED ORDER — FENTANYL CITRATE (PF) 100 MCG/2ML IJ SOLN
INTRAMUSCULAR | Status: DC | PRN
Start: 2021-11-16 — End: 2021-11-16
  Administered 2021-11-16 (×4): 25 ug via INTRAVENOUS

## 2021-11-16 MED ORDER — BUPIVACAINE HCL (PF) 0.5 % IJ SOLN
INTRAMUSCULAR | Status: AC
  Filled 2021-11-16: qty 30

## 2021-11-16 MED ORDER — KETOROLAC TROMETHAMINE 30 MG/ML IJ SOLN: 30.0000 mg | Freq: Once | INTRAMUSCULAR | Status: DC

## 2021-11-16 MED ORDER — 0.9 % SODIUM CHLORIDE (POUR BTL) OPTIME
TOPICAL | Status: DC | PRN
Start: 2021-11-16 — End: 2021-11-16
  Administered 2021-11-16: 1000 mL

## 2021-11-16 MED ORDER — HEMOSTATIC AGENTS (NO CHARGE) OPTIME
TOPICAL | Status: DC | PRN
  Administered 2021-11-16: 1 via TOPICAL

## 2021-11-16 MED ORDER — MIDAZOLAM HCL 5 MG/5ML IJ SOLN
INTRAMUSCULAR | Status: DC | PRN
Start: 2021-11-16 — End: 2021-11-16
  Administered 2021-11-16: 2 mg via INTRAVENOUS

## 2021-11-16 MED ORDER — LIDOCAINE HCL (PF) 2 % IJ SOLN
INTRAMUSCULAR | Status: AC
  Filled 2021-11-16: qty 5

## 2021-11-16 MED ORDER — FENTANYL CITRATE PF 50 MCG/ML IJ SOSY: 25.0000 ug | PREFILLED_SYRINGE | INTRAMUSCULAR | Status: DC | PRN

## 2021-11-16 MED ORDER — CHLORHEXIDINE GLUCONATE CLOTH 2 % EX PADS: 6.0000 | MEDICATED_PAD | Freq: Once | CUTANEOUS | Status: DC

## 2021-11-16 MED ORDER — ONDANSETRON HCL 4 MG/2ML IJ SOLN
INTRAMUSCULAR | Status: DC | PRN
  Administered 2021-11-16: 4 mg via INTRAVENOUS

## 2021-11-16 MED ORDER — DEXAMETHASONE SODIUM PHOSPHATE 10 MG/ML IJ SOLN
INTRAMUSCULAR | Status: DC | PRN
  Administered 2021-11-16: 10 mg via INTRAVENOUS

## 2021-11-16 MED ORDER — LIDOCAINE 2% (20 MG/ML) 5 ML SYRINGE
INTRAMUSCULAR | Status: DC | PRN
  Administered 2021-11-16: 100 mg via INTRAVENOUS

## 2021-11-16 MED ORDER — HYDROCODONE-ACETAMINOPHEN 5-325 MG PO TABS: 1.0000 | ORAL_TABLET | ORAL | 0 refills | Status: DC | PRN

## 2021-11-16 MED ORDER — FENTANYL CITRATE (PF) 100 MCG/2ML IJ SOLN
INTRAMUSCULAR | Status: AC
Start: 2021-11-16 — End: ?
  Filled 2021-11-16: qty 2

## 2021-11-16 MED ORDER — PROPOFOL 10 MG/ML IV BOLUS
INTRAVENOUS | Status: DC | PRN
  Administered 2021-11-16: 150 mg via INTRAVENOUS

## 2021-11-16 MED ORDER — LACTATED RINGERS IV SOLN: INTRAVENOUS | Status: DC | PRN

## 2021-11-16 MED ORDER — CEFAZOLIN IN SODIUM CHLORIDE 3-0.9 GM/100ML-% IV SOLN
3.0000 g | INTRAVENOUS | Status: AC
  Administered 2021-11-16: 3 g via INTRAVENOUS
  Filled 2021-11-16: qty 100

## 2021-11-16 MED ORDER — PROPOFOL 10 MG/ML IV BOLUS
INTRAVENOUS | Status: AC
  Filled 2021-11-16: qty 20

## 2021-11-16 MED ORDER — BUPIVACAINE HCL (PF) 0.5 % IJ SOLN
INTRAMUSCULAR | Status: DC | PRN
  Administered 2021-11-16: 10 mL

## 2021-11-16 MED ORDER — DEXAMETHASONE SODIUM PHOSPHATE 10 MG/ML IJ SOLN
INTRAMUSCULAR | Status: AC
  Filled 2021-11-16: qty 1

## 2021-11-16 MED ORDER — ONDANSETRON HCL 4 MG/2ML IJ SOLN: 4.0000 mg | Freq: Once | INTRAMUSCULAR | Status: DC | PRN

## 2021-11-16 MED ORDER — BUPIVACAINE LIPOSOME 1.3 % IJ SUSP
INTRAMUSCULAR | Status: AC
  Filled 2021-11-16: qty 20

## 2021-11-16 MED ORDER — MIDAZOLAM HCL 2 MG/2ML IJ SOLN
INTRAMUSCULAR | Status: AC
  Filled 2021-11-16: qty 2

## 2021-11-16 SURGICAL SUPPLY — 41 items
ADH SKN CLS APL DERMABOND .7 (GAUZE/BANDAGES/DRESSINGS) ×1
APL PRP STRL LF DISP 70% ISPRP (MISCELLANEOUS) ×1
APPLIER CLIP 11 MED OPEN (CLIP)
APPLIER CLIP 9.375 SM OPEN (CLIP)
APR CLP MED 11 20 MLT OPN (CLIP)
APR CLP SM 9.3 20 MLT OPN (CLIP)
BLADE SURG 15 STRL LF DISP TIS (BLADE) ×1 IMPLANT
BLADE SURG 15 STRL SS (BLADE) ×2
CHLORAPREP W/TINT 26 (MISCELLANEOUS) ×2 IMPLANT
CLIP APPLIE 11 MED OPEN (CLIP) IMPLANT
CLIP APPLIE 9.375 SM OPEN (CLIP) IMPLANT
CLOTH BEACON ORANGE TIMEOUT ST (SAFETY) ×2 IMPLANT
CNTNR URN SCR LID CUP LEK RST (MISCELLANEOUS) ×1 IMPLANT
CONT SPEC 4OZ STRL OR WHT (MISCELLANEOUS) ×2
COVER LIGHT HANDLE STERIS (MISCELLANEOUS) ×4 IMPLANT
DERMABOND ADVANCED (GAUZE/BANDAGES/DRESSINGS) ×1
DERMABOND ADVANCED .7 DNX12 (GAUZE/BANDAGES/DRESSINGS) ×1 IMPLANT
ELECT REM PT RETURN 9FT ADLT (ELECTROSURGICAL) ×2
ELECTRODE REM PT RTRN 9FT ADLT (ELECTROSURGICAL) ×1 IMPLANT
GLOVE SURG POLYISO LF SZ7.5 (GLOVE) ×2 IMPLANT
GLOVE SURG UNDER POLY LF SZ7 (GLOVE) ×4 IMPLANT
GOWN STRL REUS W/TWL LRG LVL3 (GOWN DISPOSABLE) ×4 IMPLANT
INST SET MINOR GENERAL (KITS) ×2 IMPLANT
KIT TURNOVER KIT A (KITS) ×2 IMPLANT
MANIFOLD NEPTUNE II (INSTRUMENTS) ×2 IMPLANT
NDL HYPO 18GX1.5 BLUNT FILL (NEEDLE) ×1 IMPLANT
NDL HYPO 21X1.5 SAFETY (NEEDLE) ×1 IMPLANT
NEEDLE HYPO 18GX1.5 BLUNT FILL (NEEDLE) ×2 IMPLANT
NEEDLE HYPO 21X1.5 SAFETY (NEEDLE) ×2 IMPLANT
PACK MINOR (CUSTOM PROCEDURE TRAY) ×2 IMPLANT
PAD ARMBOARD 7.5X6 YLW CONV (MISCELLANEOUS) ×2 IMPLANT
SET BASIN LINEN APH (SET/KITS/TRAYS/PACK) ×2 IMPLANT
SHEARS HARMONIC 9CM CVD (BLADE) ×1 IMPLANT
SPONGE INTESTINAL PEANUT (DISPOSABLE) ×1 IMPLANT
SPONGE T-LAP 18X18 ~~LOC~~+RFID (SPONGE) ×2 IMPLANT
SUT MNCRL AB 4-0 PS2 18 (SUTURE) ×2 IMPLANT
SUT VIC AB 3-0 SH 27 (SUTURE) ×2
SUT VIC AB 3-0 SH 27X BRD (SUTURE) ×1 IMPLANT
SYR 20ML LL LF (SYRINGE) ×2 IMPLANT
SYR BULB EAR ULCER 2OZ BL STRL (SYRINGE) ×1 IMPLANT
SYR CONTROL 10ML LL (SYRINGE) ×1 IMPLANT

## 2021-11-16 NOTE — Interval H&P Note (Signed)
History and Physical Interval Note: ? ?11/16/2021 ?7:23 AM ? ?James Hartman  has presented today for surgery, with the diagnosis of Lymphadneopathy.  The various methods of treatment have been discussed with the patient and family. After consideration of risks, benefits and other options for treatment, the patient has consented to  Procedure(s): ?AXILLARY LYMPH NODE BIOPSY (Right) as a surgical intervention.  The patient's history has been reviewed, patient examined, no change in status, stable for surgery.  I have reviewed the patient's chart and labs.  Questions were answered to the patient's satisfaction.   ? ? ?Aviva Signs ? ? ?

## 2021-11-16 NOTE — Op Note (Signed)
Patient:  James Hartman ? ?DOB:  Sep 23, 1975 ? ?MRN:  [id number] ? ? ?Preop Diagnosis:  Generalized lymphadenopathy ? ?Postop Diagnosis: Same ? ?Procedure: Right axillary lymph node biopsy ? ?Surgeon: Franky Macho, MD ? ?Anes: General ? ?Indications: Patient is a 46 year old black male who was referred to my care by Dr. Ellin Saba of oncology for a right axillary lymph node biopsy.  He has generalized lymphadenopathy and he is trying to rule out sarcoidosis or lymphoma.  The risks and benefits of the procedure including bleeding, infection, and pain were fully explained to the patient, who gave informed consent. ? ?Procedure note: The patient was placed in supine position.  After general anesthesia was administered, the right axilla was prepped and draped using the usual sterile technique with ChloraPrep.  Surgical site confirmation was performed. ? ?Incision was made in the right axilla.  Dissection was taken down to the deep portion of the axilla.  Care was taken to avoid any neurovascular structures.  I was ultimately able to find to enlarged lymph nodes.  Using the harmonic scalpel, they were removed.  They were sent to pathology for flow cytometry.  No abnormal bleeding was noted at the end of the procedure.  The wound was irrigated with normal saline.  Surgicel powder was placed in the base of the dissection.  The subcutaneous layer was reapproximated using 3-0 Vicryl interrupted suture.  0.5% Sensorcaine was instilled into the surrounding wound.  The skin was closed using a 4-0 Monocryl subcuticular suture.  Dermabond was applied. ? ?All tape and needle counts were correct at the end of the procedure.  Patient was awakened transferred to PACU in stable condition. ? ?Complications: None ? ?EBL: Minimal ? ?Specimen: Right axillary lymph nodes ? ? ?  ?

## 2021-11-16 NOTE — Anesthesia Postprocedure Evaluation (Signed)
Anesthesia Post Note ? ?Patient: James Hartman ? ?Procedure(s) Performed: AXILLARY LYMPH NODE BIOPSY (Right: Axilla) ? ?Patient location during evaluation: Phase II ?Anesthesia Type: General ?Level of consciousness: awake ?Pain management: pain level controlled ?Vital Signs Assessment: post-procedure vital signs reviewed and stable ?Respiratory status: spontaneous breathing and respiratory function stable ?Cardiovascular status: blood pressure returned to baseline and stable ?Postop Assessment: no headache and no apparent nausea or vomiting ?Anesthetic complications: no ?Comments: Late entry ? ? ?No notable events documented. ? ? ?Last Vitals:  ?Vitals:  ? 11/16/21 0845 11/16/21 0854  ?BP: (!) 178/123   ?Pulse: (!) 107 (!) 116  ?Resp: (!) 22 13  ?Temp:    ?SpO2: 99% 91%  ?  ?Last Pain:  ?Vitals:  ? 11/16/21 0712  ?TempSrc: Oral  ?PainSc: 5   ? ? ?  ?  ?  ?  ?  ?  ? ?Windell Norfolk ? ? ? ? ?

## 2021-11-16 NOTE — Anesthesia Procedure Notes (Signed)
Procedure Name: LMA Insertion ?Date/Time: 11/16/2021 7:37 AM ?Performed by: Myna Bright, CRNA ?Pre-anesthesia Checklist: Patient identified, Emergency Drugs available, Suction available and Patient being monitored ?Patient Re-evaluated:Patient Re-evaluated prior to induction ?Oxygen Delivery Method: Circle system utilized ?Preoxygenation: Pre-oxygenation with 100% oxygen ?Induction Type: IV induction ?Ventilation: Mask ventilation without difficulty ?LMA: LMA inserted ?LMA Size: 4.0 ?Number of attempts: 1 ?Placement Confirmation: positive ETCO2 and breath sounds checked- equal and bilateral ?Tube secured with: Tape ?Dental Injury: Teeth and Oropharynx as per pre-operative assessment  ? ? ? ? ?

## 2021-11-16 NOTE — Transfer of Care (Signed)
Immediate Anesthesia Transfer of Care Note ? ?Patient: James Hartman ? ?Procedure(s) Performed: AXILLARY LYMPH NODE BIOPSY (Right: Axilla) ? ?Patient Location: PACU ? ?Anesthesia Type:General ? ?Level of Consciousness: sedated, patient cooperative and responds to stimulation ? ?Airway & Oxygen Therapy: Patient Spontanous Breathing and Patient connected to face mask oxygen ? ?Post-op Assessment: Report given to RN, Post -op Vital signs reviewed and stable and Patient moving all extremities ? ?Post vital signs: Reviewed and stable ? ?Last Vitals:  ?Vitals Value Taken Time  ?BP 158/95 11/16/21 0849  ?Temp    ?Pulse 116 11/16/21 0850  ?Resp 13 11/16/21 0850  ?SpO2 98 % 11/16/21 0850  ?Vitals shown include unvalidated device data. ? ?Last Pain:  ?Vitals:  ? 11/16/21 0712  ?TempSrc: Oral  ?PainSc: 5   ?   ? ?Patients Stated Pain Goal: 5 (11/16/21 [id number]) ? ?Complications: No notable events documented. ?

## 2021-11-16 NOTE — Anesthesia Preprocedure Evaluation (Signed)
Anesthesia Evaluation  ?Patient identified by MRN, date of birth, ID band ?Patient awake ? ? ? ?Reviewed: ?Allergy & Precautions, H&P , NPO status , Patient's Chart, lab work & pertinent test results, reviewed documented beta blocker date and time  ? ?Airway ?Mallampati: II ? ?TM Distance: >3 FB ?Neck ROM: full ? ? ? Dental ?no notable dental hx. ? ?  ?Pulmonary ?sleep apnea , former smoker,  ?  ?Pulmonary exam normal ?breath sounds clear to auscultation ? ? ? ? ? ? Cardiovascular ?Exercise Tolerance: Good ?hypertension, negative cardio ROS ? ? ?Rhythm:regular Rate:Normal ? ? ?  ?Neuro/Psych ?negative neurological ROS ? negative psych ROS  ? GI/Hepatic ?negative GI ROS, Neg liver ROS,   ?Endo/Other  ?Morbid obesity ? Renal/GU ?negative Renal ROS  ?negative genitourinary ?  ?Musculoskeletal ? ? Abdominal ?  ?Peds ? Hematology ?negative hematology ROS ?(+)   ?Anesthesia Other Findings ? ? Reproductive/Obstetrics ?negative OB ROS ? ?  ? ? ? ? ? ? ? ? ? ? ? ? ? ?  ?  ? ? ? ? ? ? ? ? ?Anesthesia Physical ?Anesthesia Plan ? ?ASA: 2 ? ?Anesthesia Plan: General and General LMA  ? ?Post-op Pain Management:   ? ?Induction:  ? ?PONV Risk Score and Plan: Ondansetron ? ?Airway Management Planned:  ? ?Additional Equipment:  ? ?Intra-op Plan:  ? ?Post-operative Plan:  ? ?Informed Consent: I have reviewed the patients History and Physical, chart, labs and discussed the procedure including the risks, benefits and alternatives for the proposed anesthesia with the patient or authorized representative who has indicated his/her understanding and acceptance.  ? ? ? ?Dental Advisory Given ? ?Plan Discussed with: CRNA ? ?Anesthesia Plan Comments:   ? ? ? ? ? ? ?Anesthesia Quick Evaluation ? ?

## 2021-11-17 ENCOUNTER — Encounter (HOSPITAL_COMMUNITY): Payer: Self-pay | Admitting: General Surgery

## 2021-11-17 ENCOUNTER — Inpatient Hospital Stay (HOSPITAL_COMMUNITY)
Admission: EM | Admit: 2021-11-17 | Discharge: 2021-11-21 | DRG: 853 | Disposition: A | Discharge status: Home / Self Care | Payer: No Typology Code available for payment source | Attending: Family Medicine | Admitting: Family Medicine

## 2021-11-17 ENCOUNTER — Emergency Department (HOSPITAL_COMMUNITY): Payer: No Typology Code available for payment source

## 2021-11-17 DIAGNOSIS — J4540 Moderate persistent asthma, uncomplicated: Secondary | ICD-10-CM | POA: Diagnosis present

## 2021-11-17 DIAGNOSIS — G4733 Obstructive sleep apnea (adult) (pediatric): Secondary | ICD-10-CM | POA: Diagnosis present

## 2021-11-17 DIAGNOSIS — Z803 Family history of malignant neoplasm of breast: Secondary | ICD-10-CM

## 2021-11-17 DIAGNOSIS — R4182 Altered mental status, unspecified: Secondary | ICD-10-CM | POA: Diagnosis present

## 2021-11-17 DIAGNOSIS — R825 Elevated urine levels of drugs, medicaments and biological substances: Secondary | ICD-10-CM | POA: Diagnosis not present

## 2021-11-17 DIAGNOSIS — G9341 Metabolic encephalopathy: Secondary | ICD-10-CM | POA: Diagnosis present

## 2021-11-17 DIAGNOSIS — D759 Disease of blood and blood-forming organs, unspecified: Secondary | ICD-10-CM

## 2021-11-17 DIAGNOSIS — K5909 Other constipation: Secondary | ICD-10-CM | POA: Diagnosis not present

## 2021-11-17 DIAGNOSIS — I158 Other secondary hypertension: Secondary | ICD-10-CM

## 2021-11-17 DIAGNOSIS — G0490 Encephalitis and encephalomyelitis, unspecified: Secondary | ICD-10-CM | POA: Diagnosis not present

## 2021-11-17 DIAGNOSIS — Z8042 Family history of malignant neoplasm of prostate: Secondary | ICD-10-CM | POA: Diagnosis not present

## 2021-11-17 DIAGNOSIS — R159 Full incontinence of feces: Secondary | ICD-10-CM | POA: Diagnosis present

## 2021-11-17 DIAGNOSIS — R6520 Severe sepsis without septic shock: Secondary | ICD-10-CM | POA: Diagnosis present

## 2021-11-17 DIAGNOSIS — A419 Sepsis, unspecified organism: Secondary | ICD-10-CM | POA: Diagnosis present

## 2021-11-17 DIAGNOSIS — Z79899 Other long term (current) drug therapy: Secondary | ICD-10-CM

## 2021-11-17 DIAGNOSIS — Z6841 Body Mass Index (BMI) 40.0 and over, adult: Secondary | ICD-10-CM | POA: Diagnosis not present

## 2021-11-17 DIAGNOSIS — I10 Essential (primary) hypertension: Secondary | ICD-10-CM | POA: Diagnosis present

## 2021-11-17 DIAGNOSIS — J9601 Acute respiratory failure with hypoxia: Secondary | ICD-10-CM | POA: Diagnosis present

## 2021-11-17 DIAGNOSIS — R32 Unspecified urinary incontinence: Secondary | ICD-10-CM | POA: Diagnosis present

## 2021-11-17 DIAGNOSIS — R591 Generalized enlarged lymph nodes: Secondary | ICD-10-CM | POA: Diagnosis not present

## 2021-11-17 DIAGNOSIS — Z20822 Contact with and (suspected) exposure to covid-19: Secondary | ICD-10-CM | POA: Diagnosis present

## 2021-11-17 DIAGNOSIS — R0902 Hypoxemia: Secondary | ICD-10-CM | POA: Diagnosis present

## 2021-11-17 DIAGNOSIS — N289 Disorder of kidney and ureter, unspecified: Secondary | ICD-10-CM

## 2021-11-17 DIAGNOSIS — R509 Fever, unspecified: Secondary | ICD-10-CM

## 2021-11-17 DIAGNOSIS — E872 Acidosis, unspecified: Secondary | ICD-10-CM | POA: Diagnosis present

## 2021-11-17 DIAGNOSIS — D869 Sarcoidosis, unspecified: Secondary | ICD-10-CM

## 2021-11-17 DIAGNOSIS — G629 Polyneuropathy, unspecified: Secondary | ICD-10-CM | POA: Diagnosis present

## 2021-11-17 DIAGNOSIS — G039 Meningitis, unspecified: Secondary | ICD-10-CM | POA: Diagnosis present

## 2021-11-17 DIAGNOSIS — R590 Localized enlarged lymph nodes: Secondary | ICD-10-CM | POA: Diagnosis present

## 2021-11-17 DIAGNOSIS — Z7952 Long term (current) use of systemic steroids: Secondary | ICD-10-CM | POA: Diagnosis not present

## 2021-11-17 DIAGNOSIS — Z87891 Personal history of nicotine dependence: Secondary | ICD-10-CM

## 2021-11-17 DIAGNOSIS — D8689 Sarcoidosis of other sites: Secondary | ICD-10-CM | POA: Diagnosis present

## 2021-11-17 DIAGNOSIS — M5416 Radiculopathy, lumbar region: Secondary | ICD-10-CM | POA: Diagnosis present

## 2021-11-17 DIAGNOSIS — R5081 Fever presenting with conditions classified elsewhere: Secondary | ICD-10-CM | POA: Diagnosis not present

## 2021-11-17 DIAGNOSIS — E782 Mixed hyperlipidemia: Secondary | ICD-10-CM | POA: Diagnosis present

## 2021-11-17 DIAGNOSIS — E785 Hyperlipidemia, unspecified: Secondary | ICD-10-CM | POA: Diagnosis present

## 2021-11-17 DIAGNOSIS — R400 Somnolence: Secondary | ICD-10-CM | POA: Diagnosis not present

## 2021-11-17 DIAGNOSIS — I888 Other nonspecific lymphadenitis: Secondary | ICD-10-CM | POA: Diagnosis not present

## 2021-11-17 LAB — URINALYSIS, ROUTINE W REFLEX MICROSCOPIC
Bilirubin Urine: NEGATIVE
Glucose, UA: 50 mg/dL — AB
Ketones, ur: NEGATIVE mg/dL
Leukocytes,Ua: NEGATIVE
Nitrite: NEGATIVE
Protein, ur: 100 mg/dL — AB
RBC / HPF: >50 RBC/hpf — ABNORMAL HIGH (ref 0–5)
Specific Gravity, Urine: 1.019 (ref 1.005–1.030)
pH: 5 (ref 5.0–8.0)

## 2021-11-17 LAB — BLOOD GAS, ARTERIAL
Acid-Base Excess: 3.9 mmol/L — ABNORMAL HIGH (ref 0.0–2.0)
Bicarbonate: 28.5 mmol/L — ABNORMAL HIGH (ref 20.0–28.0)
Drawn by: 21310
FIO2: 21 %
O2 Saturation: 93.1 %
Patient temperature: 39.5
pCO2 arterial: 47 mmHg (ref 32–48)
pH, Arterial: 7.4 (ref 7.35–7.45)
pO2, Arterial: 69 mmHg — ABNORMAL LOW (ref 83–108)

## 2021-11-17 LAB — APTT: aPTT: 31 seconds (ref 24–36)

## 2021-11-17 LAB — CBC WITH DIFFERENTIAL/PLATELET
Abs Immature Granulocytes: 0.04 K/uL (ref 0.00–0.07)
Basophils Absolute: 0 K/uL (ref 0.0–0.1)
Basophils Relative: 0 %
Eosinophils Absolute: 0 K/uL (ref 0.0–0.5)
Eosinophils Relative: 0 %
HCT: 38.4 % — ABNORMAL LOW (ref 39.0–52.0)
Hemoglobin: 12.5 g/dL — ABNORMAL LOW (ref 13.0–17.0)
Immature Granulocytes: 1 %
Lymphocytes Relative: 14 %
Lymphs Abs: 1.1 K/uL (ref 0.7–4.0)
MCH: 29.8 pg (ref 26.0–34.0)
MCHC: 32.6 g/dL (ref 30.0–36.0)
MCV: 91.6 fL (ref 80.0–100.0)
Monocytes Absolute: 0.7 K/uL (ref 0.1–1.0)
Monocytes Relative: 9 %
Neutro Abs: 5.9 K/uL (ref 1.7–7.7)
Neutrophils Relative %: 76 %
Platelets: 225 K/uL (ref 150–400)
RBC: 4.19 MIL/uL — ABNORMAL LOW (ref 4.22–5.81)
RDW: 13.5 % (ref 11.5–15.5)
WBC: 7.8 K/uL (ref 4.0–10.5)
nRBC: 0 % (ref 0.0–0.2)

## 2021-11-17 LAB — COMPREHENSIVE METABOLIC PANEL
ALT: 9 U/L (ref 0–44)
AST: 14 U/L — ABNORMAL LOW (ref 15–41)
Albumin: 3.6 g/dL (ref 3.5–5.0)
Alkaline Phosphatase: 76 U/L (ref 38–126)
Anion gap: 10 (ref 5–15)
BUN: 14 mg/dL (ref 6–20)
CO2: 27 mmol/L (ref 22–32)
Calcium: 8.5 mg/dL — ABNORMAL LOW (ref 8.9–10.3)
Chloride: 98 mmol/L (ref 98–111)
Creatinine, Ser: 1.33 mg/dL — ABNORMAL HIGH (ref 0.61–1.24)
GFR, Estimated: >60 mL/min (ref >60)
Glucose, Bld: 100 mg/dL — ABNORMAL HIGH (ref 70–99)
Potassium: 3.8 mmol/L (ref 3.5–5.1)
Sodium: 135 mmol/L (ref 135–145)
Total Bilirubin: 1.1 mg/dL (ref 0.3–1.2)
Total Protein: 7.6 g/dL (ref 6.5–8.1)

## 2021-11-17 LAB — RESP PANEL BY RT-PCR (FLU A&B, COVID) ARPGX2
Influenza A by PCR: NEGATIVE
Influenza B by PCR: NEGATIVE
SARS Coronavirus 2 by RT PCR: NEGATIVE

## 2021-11-17 LAB — PROTIME-INR
INR: 1.1 (ref 0.8–1.2)
Prothrombin Time: 14.6 s (ref 11.4–15.2)

## 2021-11-17 LAB — RAPID URINE DRUG SCREEN, HOSP PERFORMED
Amphetamines: NOT DETECTED
Barbiturates: NOT DETECTED
Benzodiazepines: POSITIVE — AB
Cocaine: NOT DETECTED
Opiates: POSITIVE — AB
Tetrahydrocannabinol: NOT DETECTED

## 2021-11-17 LAB — SURGICAL PATHOLOGY

## 2021-11-17 LAB — PROCALCITONIN: Procalcitonin: 0.3 ng/mL

## 2021-11-17 LAB — LACTIC ACID, PLASMA
Lactic Acid, Venous: 2.1 mmol/L (ref 0.5–1.9)
Lactic Acid, Venous: 4.6 mmol/L (ref 0.5–1.9)
Lactic Acid, Venous: 4.2 mmol/L (ref 0.5–1.9)

## 2021-11-17 IMAGING — DX DG CHEST 1V PORT
1 series · 1 of 1 positions shown · non-contrast
Comparison: CT chest [DATE]

CLINICAL DATA: Questionable sepsis. Evaluate for abnormality.
Altered mental status and fever. Right axillary lymph node biopsy
yesterday. Clinical concern for lymphoma.

EXAM:
PORTABLE CHEST 1 VIEW

[chest ap]
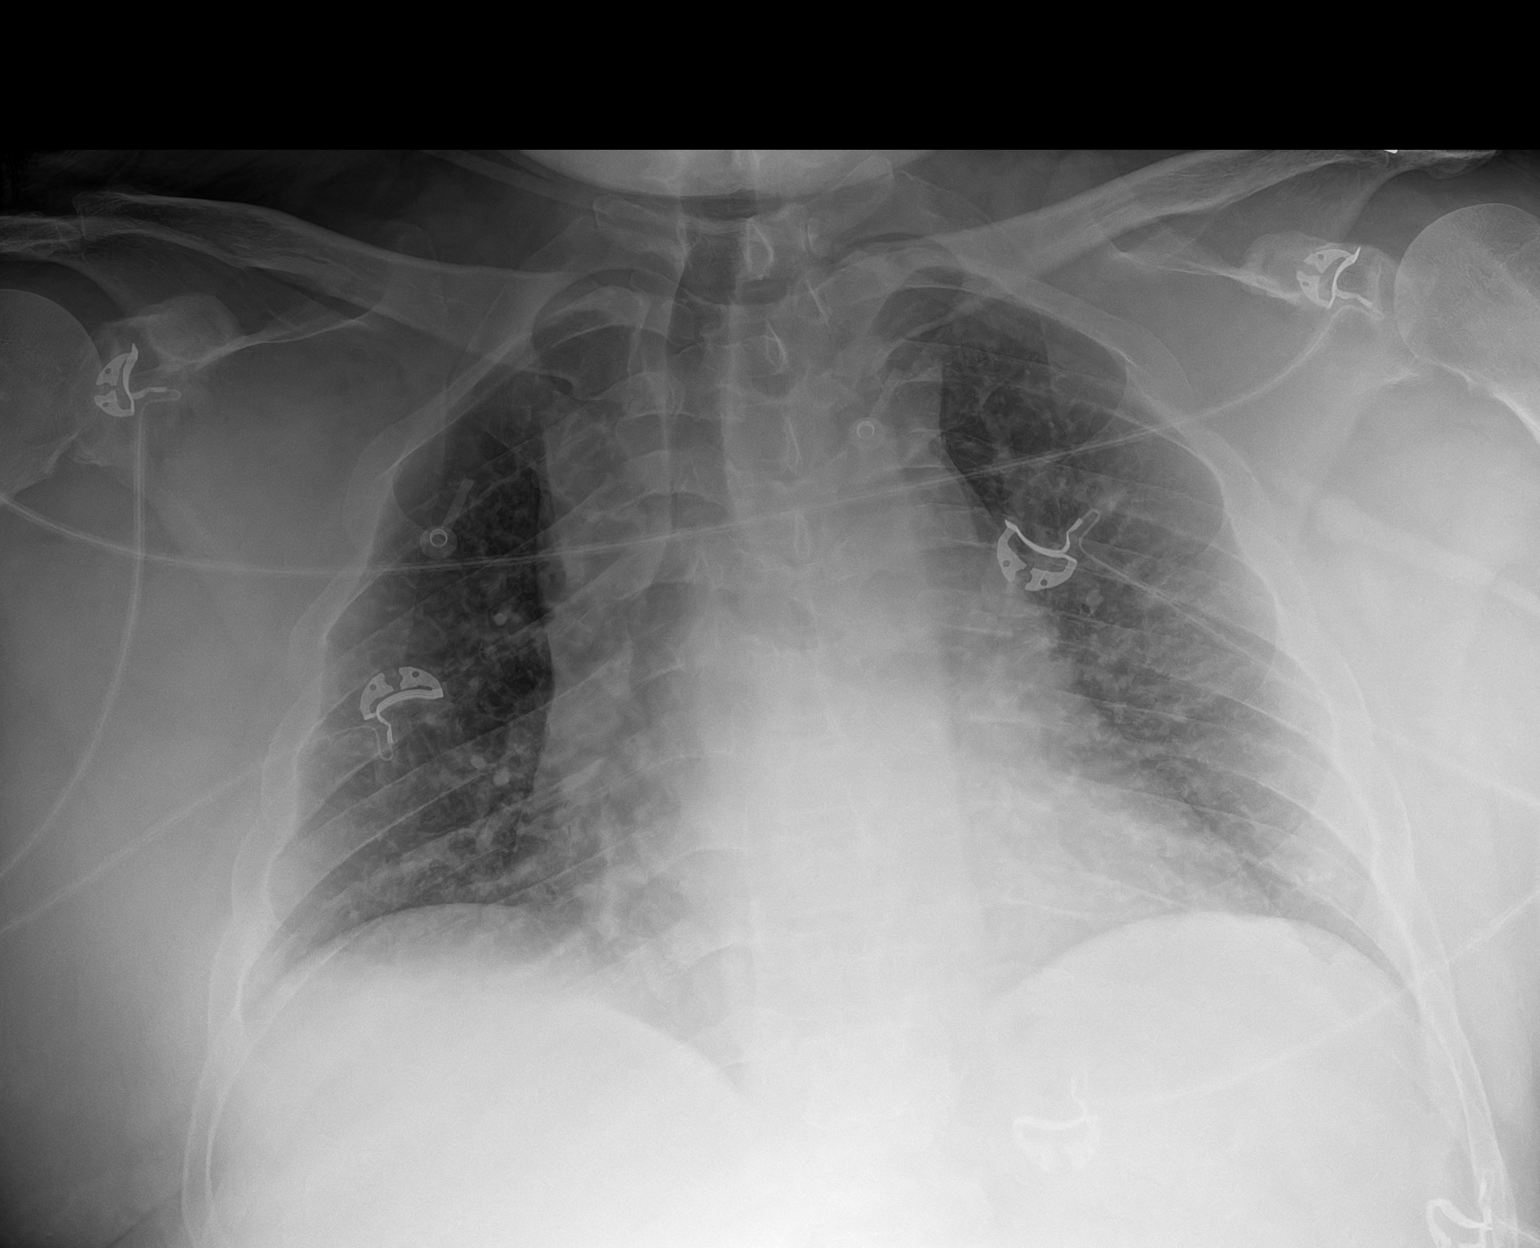

[1 of 1 positions shown; findings below may reference images not displayed]

FINDINGS: Cardiac silhouette is at the upper limits of normal for AP
technique. Mediastinal contours are grossly unremarkable. The lungs
are grossly clear. Mildly decreased lung volumes. No pleural
effusion or pneumothorax. No acute skeletal abnormality.
IMPRESSION: No definite pneumonia.

## 2021-11-17 IMAGING — CT CT HEAD W/O CM
3 of 4 series · 15 of 47 positions shown, 18 images · non-contrast
Comparison: None.

CLINICAL DATA: Altered level of consciousness, fever, hypertension,
lymphoma



[Series 2: head w o · axial · 0.44mm/px · z∈[+64,+189]mm · 9 of 31 slices shown, 12 images]
[im 3/31  brain]
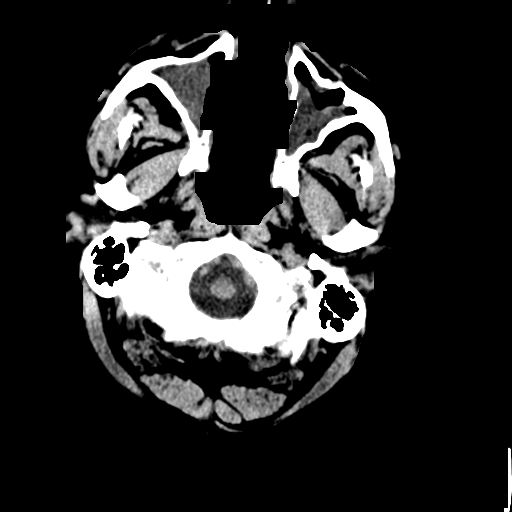
[im 3/31  bone]
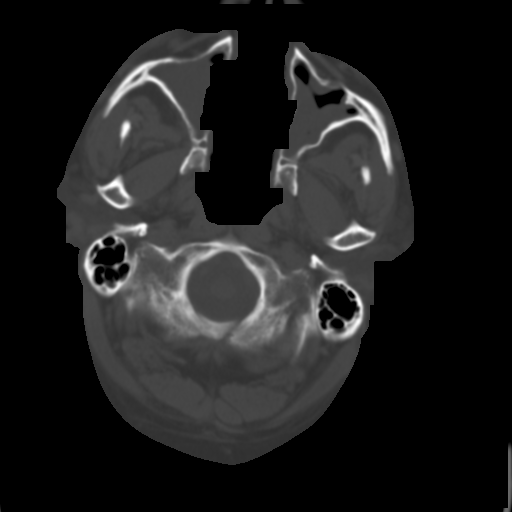
[im 7/31  brain]
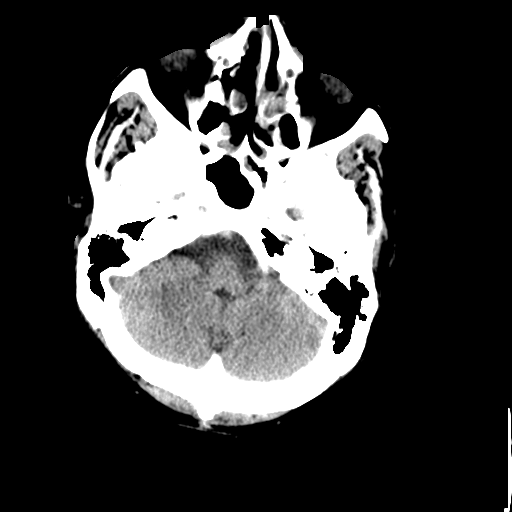
[im 9/31  brain]
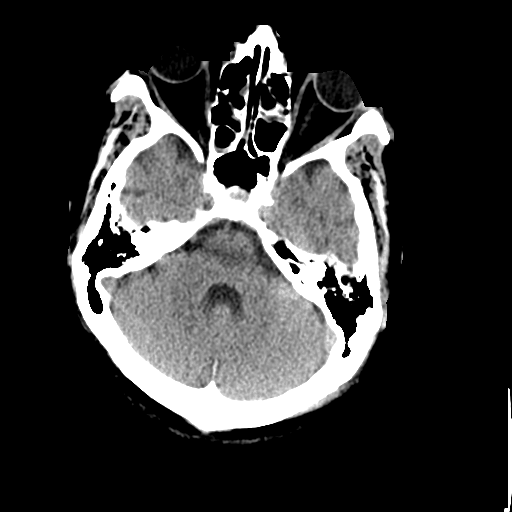
[im 13/31  brain]
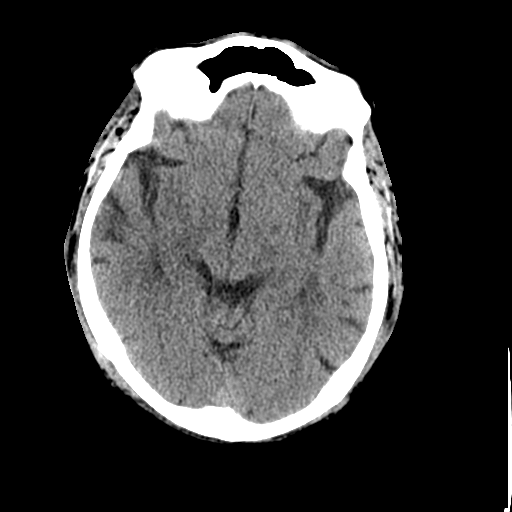
[im 16/31  brain]
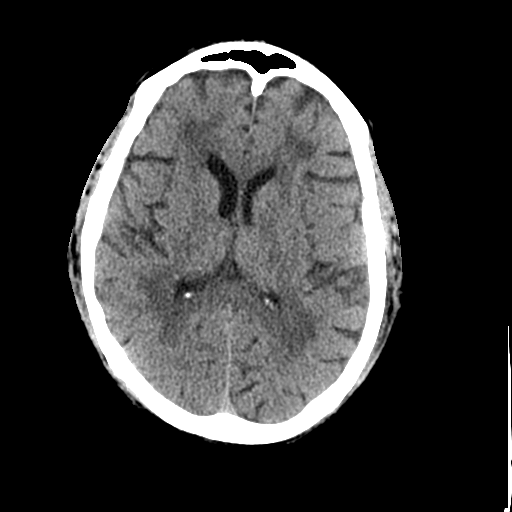
[im 16/31  bone]
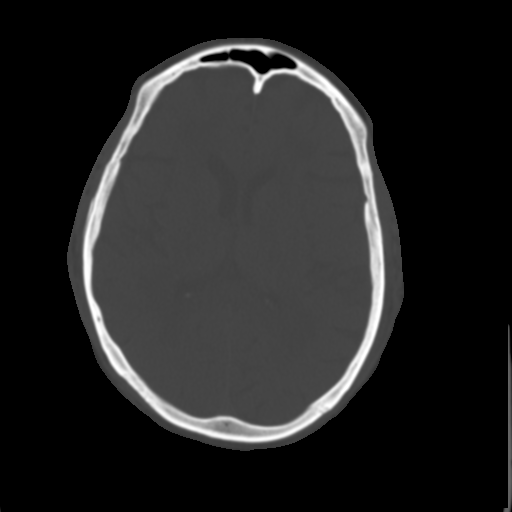
[im 18/31  brain]
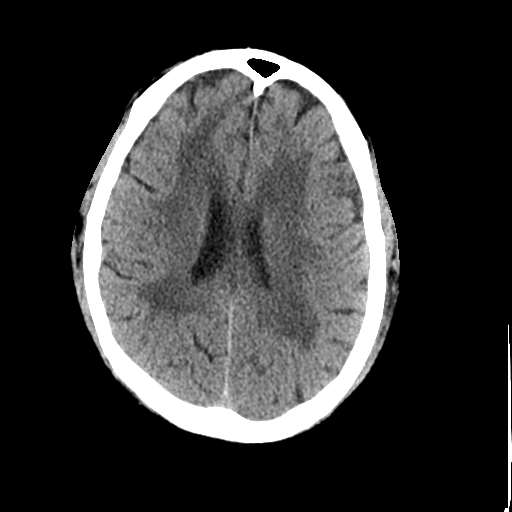
[im 22/31  brain]
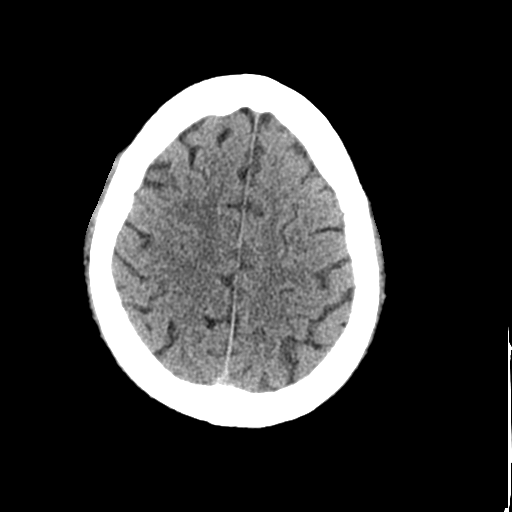
[im 24/31  brain]
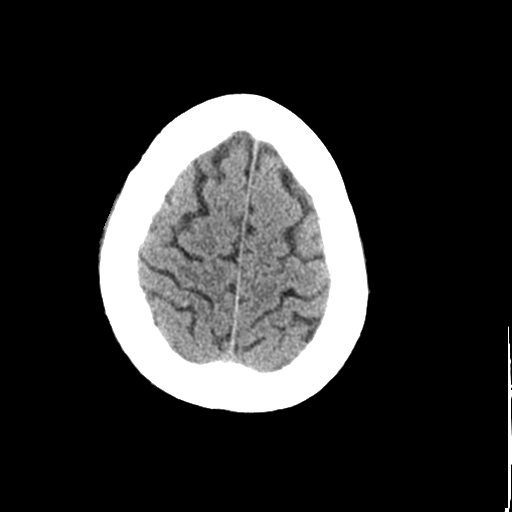
[im 28/31  brain]
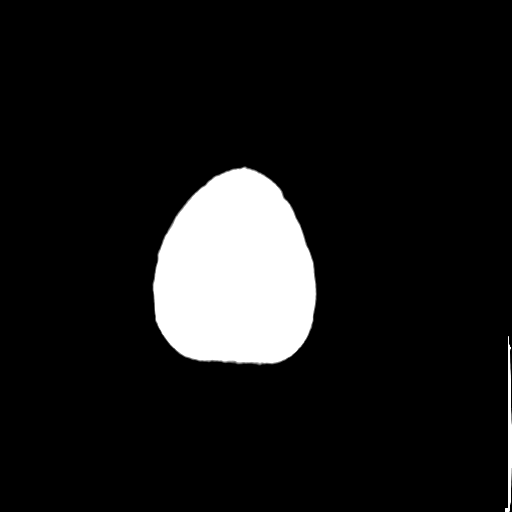
[im 28/31  bone]
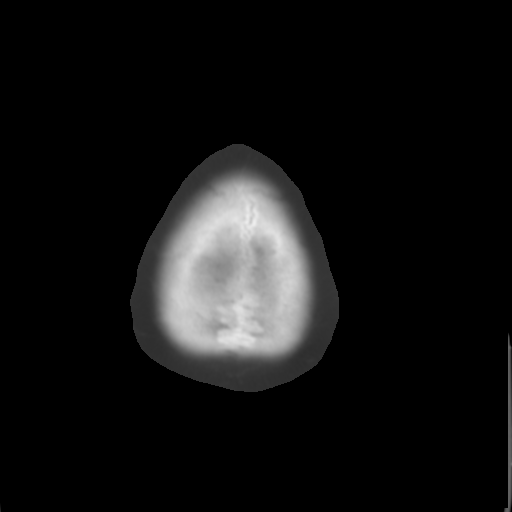

[Series 4: coronal soft · coronal · 0.33mm/px · 3 of 67 slices shown]
[im 23/67  brain]
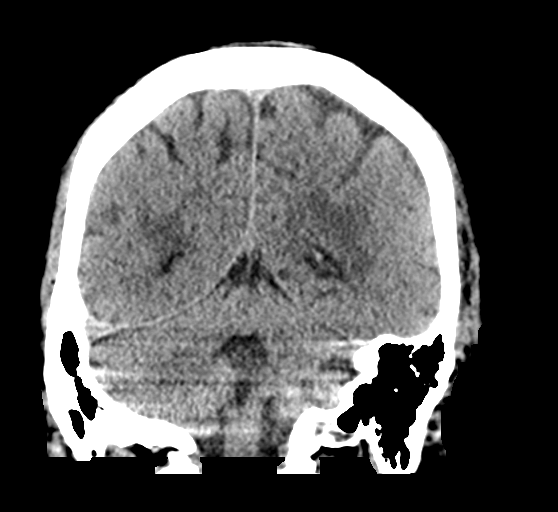
[im 30/67  brain]
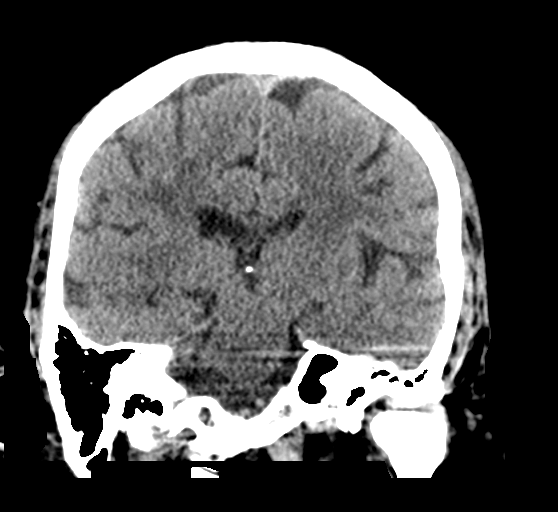
[im 37/67  brain]
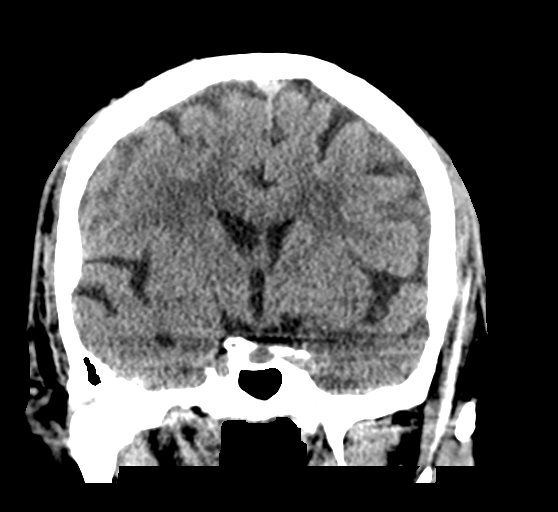

[Series 5: sagittal soft · sagittal · 0.32mm/px · 3 of 58 slices shown]
[im 20/58  brain]
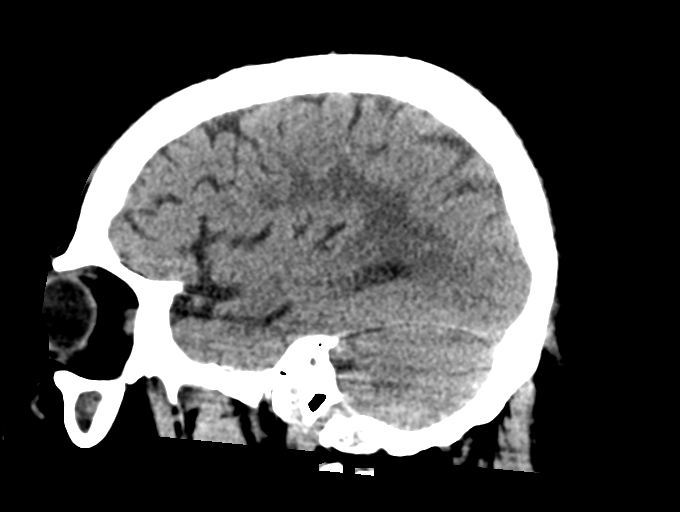
[im 29/58  brain]
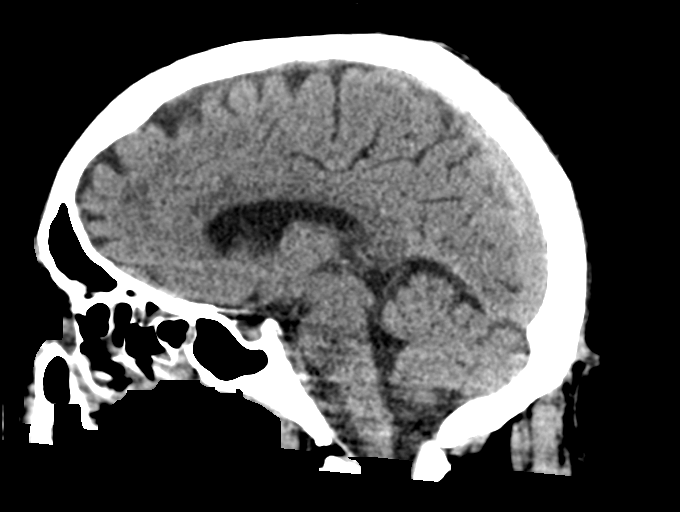
[im 39/58  brain]
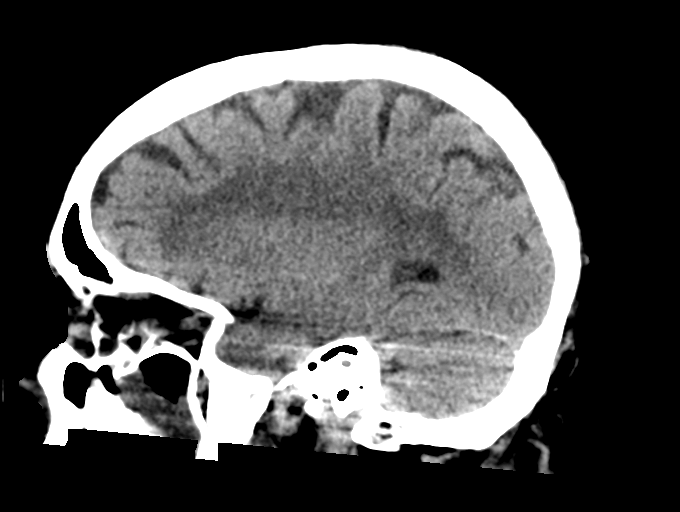

[15 of 47 positions shown; findings below may reference images not displayed]

FINDINGS: Brain: Nonspecific confluent hypodensities are seen throughout the
periventricular white matter. Pattern is most typical of chronic
microangiopathic changes, though this would be advanced for a
patient of this age. There are no specific findings on this
unenhanced CT to suggest CNS lymphoma. If further evaluation is
desired, MRI could be performed.

No other signs of acute infarct or hemorrhage. Lateral ventricles
and remaining midline structures are unremarkable. No acute
extra-axial fluid collections. No mass effect.

Vascular: No hyperdense vessel or unexpected calcification.

Skull: Normal. Negative for fracture or focal lesion.

Sinuses/Orbits: Near complete opacification of the bilateral
maxillary sinuses. Significant mucoperiosteal thickening within the
ethmoid and sphenoid sinuses.

Other: None.
IMPRESSION: 1. Confluent hypodensities throughout the periventricular white
matter, in a pattern most consistent with chronic microangiopathic
change, advanced for patient age. If further evaluation is desired,
MRI could be performed.
2. Otherwise no evidence of acute infarct or hemorrhage.
3. Paranasal sinus disease as above.

## 2021-11-17 MED ORDER — METRONIDAZOLE 500 MG/100ML IV SOLN
500.0000 mg | Freq: Once | INTRAVENOUS | Status: AC
  Administered 2021-11-17: 500 mg via INTRAVENOUS
  Filled 2021-11-17: qty 100

## 2021-11-17 MED ORDER — LORAZEPAM 2 MG/ML IJ SOLN: 2.0000 mg | Freq: Once | INTRAMUSCULAR | Status: AC

## 2021-11-17 MED ORDER — LACTATED RINGERS IV BOLUS (SEPSIS)
1000.0000 mL | Freq: Once | INTRAVENOUS | Status: AC
  Administered 2021-11-17: 1000 mL via INTRAVENOUS

## 2021-11-17 MED ORDER — VANCOMYCIN HCL 2000 MG/400ML IV SOLN
2000.0000 mg | INTRAVENOUS | Status: DC
  Administered 2021-11-18: 2000 mg via INTRAVENOUS
  Filled 2021-11-17: qty 400

## 2021-11-17 MED ORDER — ACETAMINOPHEN 650 MG RE SUPP
975.0000 mg | Freq: Once | RECTAL | Status: AC
  Administered 2021-11-17: 975 mg via RECTAL
  Filled 2021-11-17: qty 1

## 2021-11-17 MED ORDER — ACETAMINOPHEN 325 MG RE SUPP
RECTAL | Status: AC
  Administered 2021-11-17: 975 mg via RECTAL
  Filled 2021-11-17: qty 3

## 2021-11-17 MED ORDER — LACTATED RINGERS IV SOLN: INTRAVENOUS | Status: DC

## 2021-11-17 MED ORDER — SODIUM CHLORIDE 0.9 % IV SOLN
2.0000 g | INTRAVENOUS | Status: AC
  Administered 2021-11-17: 2 g via INTRAVENOUS
  Filled 2021-11-17: qty 2

## 2021-11-17 MED ORDER — ACYCLOVIR SODIUM 50 MG/ML IV SOLN
INTRAVENOUS | Status: AC
  Filled 2021-11-17: qty 10

## 2021-11-17 MED ORDER — LABETALOL HCL 5 MG/ML IV SOLN
20.0000 mg | Freq: Once | INTRAVENOUS | Status: AC
  Administered 2021-11-17: 20 mg via INTRAVENOUS
  Filled 2021-11-17: qty 4

## 2021-11-17 MED ORDER — LORAZEPAM 2 MG/ML IJ SOLN
INTRAMUSCULAR | Status: AC
  Administered 2021-11-17: 2 mg via INTRAVENOUS
  Filled 2021-11-17: qty 1

## 2021-11-17 MED ORDER — VANCOMYCIN HCL 2000 MG/400ML IV SOLN
2000.0000 mg | INTRAVENOUS | Status: AC
  Administered 2021-11-17: 2000 mg via INTRAVENOUS
  Filled 2021-11-17: qty 400

## 2021-11-17 MED ORDER — SODIUM CHLORIDE 0.9 % IV SOLN
2.0000 g | Freq: Three times a day (TID) | INTRAVENOUS | Status: DC
  Administered 2021-11-18 (×2): 2 g via INTRAVENOUS
  Filled 2021-11-17 (×4): qty 2

## 2021-11-17 MED ORDER — LACTATED RINGERS IV BOLUS (SEPSIS): 1000.0000 mL | Freq: Once | INTRAVENOUS | Status: DC

## 2021-11-17 MED ORDER — DEXTROSE 5 % IV SOLN
700.0000 mg | Freq: Three times a day (TID) | INTRAVENOUS | Status: DC
  Administered 2021-11-17 – 2021-11-18 (×3): 700 mg via INTRAVENOUS
  Filled 2021-11-17 (×6): qty 14

## 2021-11-17 NOTE — H&P (Signed)
?History and Physical  ? ? ?Patient: James Hartman BOF:[id number] DOB: 25-Nov-1975 ?DOA: 11/17/2021 ?DOS: the patient was seen and examined on 11/18/2021 ?PCP: [name], MD  ?Patient coming from: Home ? ?Chief Complaint:  ?Chief Complaint  ?Patient presents with  ? Fever  ? Altered Mental Status  ? ?HPI: James Hartman is a 46 y.o. male with medical history significant of hypertension, hyperlipidemia and obstructive sleep apnea who presents to the emergency department via EMS due to fever and altered mental status.  Patient was lethargic, though easily arousable with mild sternal rub, but he only opens his eyes, looks around and quickly goes back to sleep, he was unable to provide a history.  History was obtained from ED physician and ED medical record.  Per report, patient has poor communication Abilities at baseline.  EMS was activated at home, on arrival of EMS, patient was noted to have a fever of 104F axillary.  Patient has generalized lymphadenopathy and had a biopsy on 3/13 to rule out sarcoidosis/lymphoma.  The cause of the fever was unknown, IV hydration of 500 mL of LR was given en route to the ED.   ? ?ED course: ?In the emergency department,Patient was febrile, tachypneic, tachycardic, BP was 191/100.  Work-up in the ED shows normocytic anemia, BUNs/creatinine 14/1.33 (baseline creatinine at 0.9-1.1), ABG showed hypoxia, lactic acid 2.1 > 4.6 > 4.2 > 1.4, urinalysis was positive for proteinuria and hematuria, but was unimpressive for UTI, procalcitonin 0.30, urine drug screen was positive for opiates and benzodiazepines. 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 ?Chest x-ray showed no definite pneumonia ?Patient was treated with IV vancomycin and cefepime, Tylenol  was given and cooling blanket was placed  due to fever.  IV labetalol 20 mg x 1 was given due to high blood pressure, patient was provided with IV LR per sepsis protocol, Ativan and Flagyl were given as well.  Patient was also treated with Zovirax to cover for possible viral meningitis.  ?Patient was seen in the ED by Dr. Constance Haw and on examination of the incision, it does not appear to be the cause of patient's septic state, there were no rashes or signs of cellulitis on the body per medical record. ? Hospitalist was asked to admit patient for further evaluation and management. ? ?Review of Systems: unable to review all systems due to the inability of the patient to answer questions. ? ?Past Medical History:  ?Diagnosis Date  ? Hypertension   ? Sleep apnea   ? ?Past Surgical History:  ?Procedure Laterality Date  ? AXILLARY LYMPH NODE BIOPSY Right 11/16/2021  ? Procedure: AXILLARY LYMPH NODE BIOPSY;  Surgeon: Aviva Signs, MD;  Location: [facility];  Service: General;  Laterality: Right;  ? ?Social History:  reports that he has quit smoking. He has never used smokeless tobacco. He reports that he does not currently use alcohol. He reports that he does not use drugs. ? ?No Known Allergies ? ?History reviewed. No pertinent family history. ? ?Prior to Admission medications   ?Medication Sig Start Date End Date Taking? Authorizing Provider  ?albuterol (VENTOLIN HFA) 108 (90 Base) MCG/ACT inhaler Inhale 1-2 puffs into the lungs every 6 (six) hours as needed for wheezing or shortness of breath.   Yes [provider]  ?azithromycin (ZITHROMAX) 250 MG tablet Take 1 tablet (250 mg total) by mouth daily.  Take first 2 tablets together, then 1 every day until finished. ?Patient not taking: Reported on 11/12/2021 11/11/15   Orpah Greek, MD  ?DULoxetine (CYMBALTA) 60 MG capsule Take 60 mg by mouth in the morning, at noon, and at bedtime.    [provider]  ?FLOVENT HFA 220 MCG/ACT inhaler Inhale 1 puff into the lungs daily as needed (shortness of  breath or wheezing). 07/13/21   [provider]  ?fluticasone (FLONASE) 50 MCG/ACT nasal spray Place 1 spray into both nostrils daily as needed for allergies or rhinitis.    [provider]  ?folic acid (FOLVITE) 1 MG tablet Take 1 mg by mouth daily. 09/08/21   [provider]  ?gabapentin (NEURONTIN) 300 MG capsule Take 300 mg by mouth 3 (three) times daily. 09/17/21   [provider]  ?hydrochlorothiazide (HYDRODIURIL) 12.5 MG tablet Take 12.5 mg by mouth daily.    [provider]  ?HYDROcodone-acetaminophen (NORCO) 5-325 MG tablet Take 1 tablet by mouth every 4 (four) hours as needed for moderate pain. 11/16/21   Aviva Signs, MD  ?ibuprofen (ADVIL) 200 MG tablet Take 400 mg by mouth every 6 (six) hours as needed for mild pain.    [provider]  ?pravastatin (PRAVACHOL) 40 MG tablet Take 40 mg by mouth daily. 09/08/21   [provider]  ?sodium chloride (OCEAN) 0.65 % SOLN nasal spray Place 1 spray into both nostrils as needed for congestion.    [provider]  ?valsartan (DIOVAN) 80 MG tablet Take 80 mg by mouth daily. 09/17/21   [provider]  ? ? ?Physical Exam: ?Vitals:  ? 11/18/21 0000 11/18/21 0030 11/18/21 0045 11/18/21 0158  ?BP: 116/76 (!) 163/94  (!) 141/71  ?Pulse: (!) 102 (!) 107  99  ?Resp: (!) 36 (!) 36  (!) 32  ?Temp:   (!) 103.2 ?F (39.6 ?C)   ?TempSrc:   Core (Comment)   ?SpO2: 99% 98%  99%  ?Weight:      ?Height:      ? ?General: Patient was lethargic, though easily arousable, but only looked around for a few seconds and quickly go back to sleep.  He was not  in any acute distress.  ?HEENT: NCAT.  Sclerae anicteric.  Moist mucosal membranes. ?Neck: Neck supple without lymphadenopathy. No carotid bruits. No masses palpated.  ?Cardiovascular: Tachycardia, regular rate with normal S1-S2 sounds. No murmurs, rubs or gallops auscultated. No JVD.  ?Respiratory: Tachypnea, clear breath sounds.  No accessory muscle  use. ?Abdomen: Soft, nontender, nondistended. Active bowel sounds. No masses or hepatosplenomegaly  ?Skin: Right axilla with healing wound, nonerythematous and with no drainage.   ?Musculoskeletal:  2+ dorsalis pedis and radial pulses. Good ROM.  No contractures  ?Psychiatric: This cannot be assessed at this time due to patient's current condition ?Neurologic: No focal neurological deficits.  Patient was moving all extremities, he was arousable, though not talking/responding to questions.  Patient appears confused.   ? ?Data Reviewed: ?EKG personally reviewed showed sinus tachycardia at a rate of 110 bpm ? ?Assessment and Plan: ?* Severe sepsis ([place]) ?Patient met sepsis criteria due to being febrile, tachypnea, tachycardia, unfortunately, source of infection is currently unknown.  Lactic acid was elevated, though this has since normalized. ?Patient will be admitted to stepdown unit ?CT of head and chest x-ray showed no acute findings ?Patient was empirically started on IV vancomycin and cefepime, IV Zovirax was also started empirically to cover for possible viral meningitis.  Of note, LP  was not done, IR will be consulted for LP in the morning ?Continue Tylenol as needed ?Continue cooling blanket ?Blood culture and urine culture pending ? ?Altered mental status ?Continue fall precaution, neurochecks, aspiration precaution, seizure precaution ?Continue management as described for severe sepsis ? ?Lactic acidosis ?Lactic acid 2.1 > 4.6 > 4.2 > 1.4-resolved ? ? ?Acute respiratory failure with hypoxia ([place]) ?Continue supplemental oxygen to obtain O2 sat greater than 92% ? ?Positive urine drug screen ?Urine drug screen was positive for benzodiazepine, Ativan was given in the ED, however, it was also positive for opiates, it is unknown if patient received opioid med prior to arrival to the ED  ?Continue to monitor and treat accordingly ? ?Generalized lymphadenopathy ?Patient had a biopsy on 3/13 ? ?Obstructive sleep  apnea ?It is unknown if patient uses CPAP at home, he was noted to be hypoxic on room air ?Continue supplemental oxygen to maintain O2 sat > 92% ? ?Hyperlipidemia ?Resume home meds when patient is alert enough to be a

## 2021-11-17 NOTE — Progress Notes (Signed)
Pharmacy Antiviral Note ? ?James Hartman is a 46 y.o. male admitted on 11/17/2021 with AMS concerning for meningitis  Pharmacy has been consulted for Acyclovir dosing. Pt continues on vanc and cefepime. ? ?Plan: ?Acyclovir 700mg  IV q 8hr (10mg /kg) ?Continue LR  ?BMET daily x 3 ? ?Height: 5\' 8"  (172.7 cm) ?Weight: 127.5 kg (281 lb) ?IBW/kg (Calculated) : 68.4 ? ?Temp (24hrs), Avg:103.7 ?F (39.8 ?C), Min:103.2 ?F (39.6 ?C), Max:104 ?F (40 ?C) ? ?Recent Labs  ?Lab 11/17/21 ?1751 11/17/21 ?1938  ?WBC 7.8  --   ?CREATININE 1.33*  --   ?LATICACIDVEN 2.1* 4.6*  ?  ?Estimated Creatinine Clearance: 88.4 mL/min (A) (by C-G formula based on SCr of 1.33 mg/dL (H)).   ? ?No Known Allergies ? ?Thank you for allowing pharmacy to be a part of this patient?s care. ? ? ?11/17/2021 9:17 PM ? ?

## 2021-11-17 NOTE — Progress Notes (Addendum)
[facility] ? ?Looked at right axillary incision and c/d/I with no signs of crepitus or tenderness. No obvious erythema or swelling. ? ?Patient  s/p General anesthesia for lymph node biopsy, here with sepsis, being worked up. At this time the incision does not appear to be the cause. No rashes or signs of cellulitis on the body noted. ? ? ?Algis Greenhouse, MD ?[facility] ?[address] ?[place], [place] [address] ?[phone] (office) ? ?

## 2021-11-17 NOTE — ED Notes (Signed)
EDP notified that temp had not come down. States to check again in an hour as rectral tylenol takes twice the amt of time to work. Will cont to monitor  ?

## 2021-11-17 NOTE — ED Provider Notes (Signed)
[facility] EMERGENCY DEPARTMENT Provider Note   CSN: [id number] Arrival date & time: 11/17/21  1708     History  Chief Complaint  Patient presents with   Fever   Altered Mental Status    James Hartman is a 46 y.o. male.  HPI Patient presents from home for evaluation of fever and altered mental status that started today.  Apparently at baseline he has poor communication capabilities.  EMS transferred him here and gave him saline bolus during transport.  He was found to have a axillary temperature of 104.  Yesterday, the patient had a right axillary lymph node biopsy, to help differentiate generalized adenopathy as lymphoma versus sarcoidosis    Home Medications Prior to Admission medications   Medication Sig Start Date End Date Taking? Authorizing Provider  albuterol (VENTOLIN HFA) 108 (90 Base) MCG/ACT inhaler Inhale 1-2 puffs into the lungs every 6 (six) hours as needed for wheezing or shortness of breath.   Yes [provider]  azithromycin (ZITHROMAX) 250 MG tablet Take 1 tablet (250 mg total) by mouth daily. Take first 2 tablets together, then 1 every day until finished. Patient not taking: Reported on 11/12/2021 11/11/15   Gilda Crease, MD  DULoxetine (CYMBALTA) 60 MG capsule Take 60 mg by mouth in the morning, at noon, and at bedtime.    [provider]  FLOVENT HFA 220 MCG/ACT inhaler Inhale 1 puff into the lungs daily as needed (shortness of breath or wheezing). 07/13/21   [provider]  fluticasone (FLONASE) 50 MCG/ACT nasal spray Place 1 spray into both nostrils daily as needed for allergies or rhinitis.    [provider]  folic acid (FOLVITE) 1 MG tablet Take 1 mg by mouth daily. 09/08/21   [provider]  gabapentin (NEURONTIN) 300 MG capsule Take 300 mg by mouth 3 (three) times daily. 09/17/21   [provider]  hydrochlorothiazide (HYDRODIURIL) 12.5 MG tablet Take 12.5 mg by mouth daily.    [provider]  HYDROcodone-acetaminophen (NORCO) 5-325 MG tablet Take 1 tablet by mouth every 4 (four) hours as needed for moderate pain. 11/16/21   Franky Macho, MD  ibuprofen (ADVIL) 200 MG tablet Take 400 mg by mouth every 6 (six) hours as needed for mild pain.    [provider]  pravastatin (PRAVACHOL) 40 MG tablet Take 40 mg by mouth daily. 09/08/21   [provider]  sodium chloride (OCEAN) 0.65 % SOLN nasal spray Place 1 spray into both nostrils as needed for congestion.    [provider]  valsartan (DIOVAN) 80 MG tablet Take 80 mg by mouth daily. 09/17/21   [provider]      Allergies    Patient has no known allergies.    Review of Systems   Review of Systems  Physical Exam Updated Vital Signs BP (!) 163/94   Pulse (!) 107   Temp (!) 103.2 F (39.6 C) (Core (Comment))   Resp (!) 36   Ht 5\' 8"  (1.727 m)   Wt 127.5 kg   SpO2 98%   BMI 42.73 kg/m  Physical Exam Vitals and nursing note reviewed.  Constitutional:      General: He is not in acute distress.    Appearance: He is well-developed. He is not ill-appearing, toxic-appearing or diaphoretic.  HENT:     Head: Normocephalic and atraumatic.     Right Ear: External ear normal.     Left Ear: External ear normal.  Nose: No congestion or rhinorrhea.  Eyes:     Conjunctiva/sclera: Conjunctivae normal.     Comments: Left pupil somewhat larger than right, both reactive.  Neck:     Trachea: Phonation normal.  Cardiovascular:     Rate and Rhythm: Regular rhythm. Tachycardia present.     Heart sounds: Normal heart sounds.  Pulmonary:     Effort: Pulmonary effort is normal. No respiratory distress.     Breath sounds: Normal breath sounds. No stridor.  Abdominal:     General: There is no distension.     Palpations: Abdomen is soft. There is no mass.     Tenderness: There is no abdominal tenderness.  Musculoskeletal:        General: No swelling, tenderness, deformity or signs of  injury.     Cervical back: Normal range of motion and neck supple.  Skin:    General: Skin is warm and dry.     Comments: Healing wound, right axilla, edges approximated, no localized swelling, drainage or erythema.  This area is nontender to palpation.  Neurological:     Mental Status: He is alert.     GCS: GCS eye subscore is 4. GCS verbal subscore is 1. GCS motor subscore is 5.     Cranial Nerves: No cranial nerve deficit.     Motor: No abnormal muscle tone.     Coordination: Coordination normal.     Comments: Patient does not verbally respond, or answer questions.  Moves all extremities equally.  He seems confused and agitated.  Psychiatric:     Comments: Lethargic    ED Results / Procedures / Treatments   Labs (all labs ordered are listed, but only abnormal results are displayed) Labs Reviewed  LACTIC ACID, PLASMA - Abnormal; Notable for the following components:      Result Value   Lactic Acid, Venous 2.1 (*)    All other components within normal limits  LACTIC ACID, PLASMA - Abnormal; Notable for the following components:   Lactic Acid, Venous 4.6 (*)    All other components within normal limits  COMPREHENSIVE METABOLIC PANEL - Abnormal; Notable for the following components:   Glucose, Bld 100 (*)    Creatinine, Ser 1.33 (*)    Calcium 8.5 (*)    AST 14 (*)    All other components within normal limits  CBC WITH DIFFERENTIAL/PLATELET - Abnormal; Notable for the following components:   RBC 4.19 (*)    Hemoglobin 12.5 (*)    HCT 38.4 (*)    All other components within normal limits  URINALYSIS, ROUTINE W REFLEX MICROSCOPIC - Abnormal; Notable for the following components:   Glucose, UA 50 (*)    Hgb urine dipstick LARGE (*)    Protein, ur 100 (*)    RBC / HPF >50 (*)    Bacteria, UA RARE (*)    All other components within normal limits  BLOOD GAS, ARTERIAL - Abnormal; Notable for the following components:   pO2, Arterial 69 (*)    Bicarbonate 28.5 (*)    Acid-Base  Excess 3.9 (*)    All other components within normal limits  RAPID URINE DRUG SCREEN, HOSP PERFORMED - Abnormal; Notable for the following components:   Opiates POSITIVE (*)    Benzodiazepines POSITIVE (*)    All other components within normal limits  LACTIC ACID, PLASMA - Abnormal; Notable for the following components:   Lactic Acid, Venous 4.2 (*)    All other components within normal limits  RESP PANEL BY RT-PCR (FLU A&B, COVID) ARPGX2  CULTURE, BLOOD (ROUTINE X 2)  CULTURE, BLOOD (ROUTINE X 2)  URINE CULTURE  PROTIME-INR  APTT  LACTIC ACID, PLASMA  PROCALCITONIN  BASIC METABOLIC PANEL    EKG None  Radiology CT Head Wo Contrast  Result Date: 11/17/2021 CLINICAL DATA:  Altered level of consciousness, fever, hypertension, lymphoma EXAM: CT HEAD WITHOUT CONTRAST TECHNIQUE: Contiguous axial images were obtained from the base of the skull through the vertex without intravenous contrast. RADIATION DOSE REDUCTION: This exam was performed according to the departmental dose-optimization program which includes automated exposure control, adjustment of the mA and/or kV according to patient size and/or use of iterative reconstruction technique. COMPARISON:  None. FINDINGS: Brain: Nonspecific confluent hypodensities are seen throughout the periventricular white matter. Pattern is most typical of chronic microangiopathic changes, though this would be advanced for a patient of this age. There are no specific findings on this unenhanced CT to suggest CNS lymphoma. If further evaluation is desired, MRI could be performed. No other signs of acute infarct or hemorrhage. Lateral ventricles and remaining midline structures are unremarkable. No acute extra-axial fluid collections. No mass effect. Vascular: No hyperdense vessel or unexpected calcification. Skull: Normal. Negative for fracture or focal lesion. Sinuses/Orbits: Near complete opacification of the bilateral maxillary sinuses. Significant  mucoperiosteal thickening within the ethmoid and sphenoid sinuses. Other: None. IMPRESSION: 1. Confluent hypodensities throughout the periventricular white matter, in a pattern most consistent with chronic microangiopathic change, advanced for patient age. If further evaluation is desired, MRI could be performed. 2. Otherwise no evidence of acute infarct or hemorrhage. 3. Paranasal sinus disease as above. Electronically Signed   By: Sharlet Salina M.D.   On: 11/17/2021 19:06   DG Chest Port 1 View  Result Date: 11/17/2021 CLINICAL DATA:  Questionable sepsis. Evaluate for abnormality. Altered mental status and fever. Right axillary lymph node biopsy yesterday. Clinical concern for lymphoma. EXAM: PORTABLE CHEST 1 VIEW COMPARISON:  CT chest 10/19/2021 FINDINGS: Cardiac silhouette is at the upper limits of normal for AP technique. Mediastinal contours are grossly unremarkable. The lungs are grossly clear. Mildly decreased lung volumes. No pleural effusion or pneumothorax. No acute skeletal abnormality. IMPRESSION: No definite pneumonia. Electronically Signed   By: Neita Garnet M.D.   On: 11/17/2021 18:01    Procedures Procedures    Medications Ordered in ED Medications  lactated ringers infusion ( Intravenous New Bag/Given 11/17/21 2307)  ceFEPIme (MAXIPIME) 2 g in sodium chloride 0.9 % 100 mL IVPB (has no administration in time range)  vancomycin (VANCOREADY) IVPB 2000 mg/400 mL (has no administration in time range)  acyclovir (ZOVIRAX) 700 mg in dextrose 5 % 100 mL IVPB (0 mg Intravenous Stopped 11/17/21 2327)  acetaminophen (TYLENOL) 325 MG suppository (975 mg Rectal Given 11/17/21 1747)  lactated ringers bolus 1,000 mL (0 mLs Intravenous Stopped 11/17/21 2003)    And  lactated ringers bolus 1,000 mL (0 mLs Intravenous Stopped 11/17/21 2003)    And  lactated ringers bolus 1,000 mL (0 mLs Intravenous Stopped 11/17/21 2217)  metroNIDAZOLE (FLAGYL) IVPB 500 mg (0 mg Intravenous Stopped 11/17/21 2011)   ceFEPIme (MAXIPIME) 2 g in sodium chloride 0.9 % 100 mL IVPB (0 g Intravenous Stopped 11/17/21 2003)  vancomycin (VANCOREADY) IVPB 2000 mg/400 mL (0 mg Intravenous Stopped 11/17/21 2217)  LORazepam (ATIVAN) injection 2 mg (2 mg Intravenous Given 11/17/21 1820)  labetalol (NORMODYNE) injection 20 mg (20 mg Intravenous Given 11/17/21 2211)  acetaminophen (TYLENOL) suppository 975 mg (975 mg Rectal  Given 11/17/21 2211)    ED Course/ Medical Decision Making/ A&P Clinical Course as of 11/18/21 0046  Tue Nov 17, 2021  1758 The patient's son is now here and states the patient is usually alert and alert and able to manage his own affairs.  He apparently was okay yesterday after the biopsy but today he is confused and was found to have fever. [EW]  2050 Case discussed with neuro hospitalist, who recommends adding acyclovir to treat for possible meningitis until LP can be done.  He recommends LP under fluoroscopy due to body habitus and current mental status.  He recommends that patient be maintained at this facility[facility].  Consider EEG, tomorrow morning if persistent symptoms. [EW]  2146 Patient's blood pressure has trended high and his temperature has increased.  We will give labetalol, and repeat Tylenol. [EW]  2146 Patient continues to be somewhat more alert he is now talking to a second daughter who is visiting.  He is able to squeeze his left hand, with greater strength in his right, at this time.  There is no clinical evidence for seizure, currently. [EW]  2221 Blood pressure improved after single dose of labetalol IV. [EW]    Clinical Course User Index [EW] Mancel Bale, MD                           Medical Decision Making Patient presenting with altered mental status and fever.  Sepsis protocol started on arrival.  Problems Addressed: Altered mental status, unspecified altered mental status type: acute illness or injury    Details: Sudden onset today, etiology not  clear Other secondary hypertension: acute illness or injury    Details: Required treatment in the emergency department Renal insufficiency: acute illness or injury    Details: Insidious onset Sepsis, due to unspecified organism, unspecified whether acute organ dysfunction present [facility]): acute illness or injury    Details: Etiology unclear  Amount and/or Complexity of Data Reviewed Independent Historian: caregiver    Details: Patient's wife, and 2 daughters, give history. External Data Reviewed: notes.    Details: He is being evaluated for lymphadenopathy, had biopsy of the right axilla yesterday.  He received general anesthesia for the procedure. Labs: ordered.    Details: CBC, metabolic panel, lactate, lactate, blood cultures, viral panel, urinalysis, arterial blood gas-normal except lactate elevated, creatinine high, calcium low, hemoglobin low, urinalysis abnormal, arterial PO2 low, UDS positive for opiates and benzodiazepines. Radiology: ordered and independent interpretation performed.    Details: CT head with nonspecific hypodensities, likely microvascular disease.  Chest x-ray no acute infiltrate or edema. ECG/medicine tests: ordered and independent interpretation performed.    Details: Cardiac monitor-sinus tachycardia Discussion of management or test interpretation with external provider(s): Case discussed with neuro hospitalist to [facility]  Dr. Derry Lory, recommends empiric coverage for meningitis, LP under fluoroscopy when available, admit to this facility, consider EEG if signs of seizure.  Case discussed with hospitalist for admission.  Risk Prescription drug management. Decision regarding hospitalization. Risk Details: Patient presenting with fever and altered mental status acute onset today.  Lymph node biopsy yesterday, without clear signs for infection associated with that.  Patient had marginal improvement, and was able to cooperate, and speak  although remained obtunded.  Blood pressure mildly elevated, increased, and was treated with labetalol.  Doubt hypertensive urgency causing altered mental status.  Persistent fever with lactate elevation despite IV fluid treatment with lactate initially  slightly elevated then going to 4.6.  Consideration for acute intracranial abnormality, CT head does not show acute abnormalities.  Doubt ongoing seizure activity since he is following commands.  Doubt pneumonia or UTI.  Consider a fresh for meningitis.  Patient continued to be agitated therefore LP was not attempted to evaluate for possible meningitis.  Also note his BMI is greater than 40, making LP very difficult, with his body habitus.  He will need LP under fluoroscopy if consideration for meningitis cannot be ruled out.  This can be done after hospitalization.  Patient requires hospitalization, and closely observe setting.  Case was discussed with neuro hospitalist who states that the patient can be managed at this facility, [facility].  Critical Care Total time providing critical care: 90 minutes          Final Clinical Impression(s) / ED Diagnoses Final diagnoses:  Sepsis, due to unspecified organism, unspecified whether acute organ dysfunction present (HCC)  Altered mental status, unspecified altered mental status type  Other secondary hypertension  Renal insufficiency    Rx / DC Orders ED Discharge Orders     None         Mancel Bale, MD 11/18/21 [id number]

## 2021-11-17 NOTE — ED Notes (Signed)
Cooling blanket applied.

## 2021-11-17 NOTE — ED Notes (Signed)
Pt noted with increased work of breathing and periods of snoring. EDP notified and new verbal orders for ABG. Respiratory made aware and en route. Pt arousable by voice and able to state name but still with increased confusion and disorientation to time/place/situation. Temp foley placed per edp verbal orders  ?

## 2021-11-17 NOTE — ED Triage Notes (Signed)
Pt arrives via [facility] from home with reports of AMS and fever. EMS reports fever 104 axillary and hypertensive. Pt had biopsy yesterday in right arm per EMS as well. Pt unable to give meaningful information, A/Ox4 at baseline. EMS administered 500 bolus LR en route.  ?

## 2021-11-17 NOTE — Sepsis Progress Note (Signed)
ELink monitoring sepsis protocol 

## 2021-11-17 NOTE — Progress Notes (Signed)
Pharmacy Antibiotic Note ? ?James Hartman is a 46 year old obese male s/p recent lymph node biopsy presented on 3/14 with fever/AMS/Sepsis workup.Marland Kitchen  Pharmacy has been consulted for Vanc/Cefepime dosing. ? ?Plan: ?Vanc 2gm q 24 hours ?Cefepime 2gm q 8 hrs ?Monitor Scr, culture results, and clinical progress. ? ?Height: 5\' 8"  (172.7 cm) ?Weight: 127.5 kg (281 lb) ?IBW/kg (Calculated) : 68.4 ?Cal AUC 532 using Ke 0.059 and Vd 63.8 ? ?Temp (24hrs), Avg:103.2 ?F (39.6 ?C), Min:103.2 ?F (39.6 ?C), Max:103.2 ?F (39.6 ?C) ? ?Recent Labs  ?Lab 11/17/21 ?1751  ?WBC 7.8  ?CREATININE 1.33*  ?LATICACIDVEN 2.1*  ?  ?Estimated Creatinine Clearance: 88.4 mL/min (A) (by C-G formula based on SCr of 1.33 mg/dL (H)).   ? ?No Known Allergies ? ?Antimicrobials this admission: ?3/14 Vanc >> ?3/14 Cefepime >> ?3/14 Flagyl x1 ? ? ?Microbiology results: ?3/14 Bcx x2 > ?3/14 Ucx > ? ?Thank you for allowing pharmacy to be a part of this patient?s care. ? ?4/14 ?11/17/2021 7:01 PM ? ?

## 2021-11-17 NOTE — ED Notes (Signed)
Pt to ct 

## 2021-11-17 NOTE — Progress Notes (Signed)
ABG drawn and taken to lab 

## 2021-11-17 NOTE — ED Notes (Addendum)
EDP informed of continued elevated temp and bp ?

## 2021-11-17 NOTE — ED Notes (Signed)
Pt agitated trying to fight staff and pull cords. EDP with with new verbal orders for ativan 1-2mg   ?

## 2021-11-17 NOTE — ED Notes (Signed)
Ice packs applied to bilateral axillary and groin ?

## 2021-11-18 ENCOUNTER — Encounter (HOSPITAL_COMMUNITY): Payer: Self-pay | Admitting: Internal Medicine

## 2021-11-18 ENCOUNTER — Inpatient Hospital Stay (HOSPITAL_COMMUNITY)
Admit: 2021-11-18 | Discharge: 2021-11-18 | Disposition: A | Discharge status: Home / Self Care | Payer: No Typology Code available for payment source | Attending: Internal Medicine | Admitting: Internal Medicine

## 2021-11-18 ENCOUNTER — Inpatient Hospital Stay (HOSPITAL_COMMUNITY): Payer: No Typology Code available for payment source

## 2021-11-18 ENCOUNTER — Other Ambulatory Visit: Payer: Self-pay

## 2021-11-18 DIAGNOSIS — R4182 Altered mental status, unspecified: Secondary | ICD-10-CM

## 2021-11-18 DIAGNOSIS — R509 Fever, unspecified: Secondary | ICD-10-CM

## 2021-11-18 DIAGNOSIS — R5081 Fever presenting with conditions classified elsewhere: Secondary | ICD-10-CM

## 2021-11-18 DIAGNOSIS — R400 Somnolence: Secondary | ICD-10-CM

## 2021-11-18 DIAGNOSIS — R6520 Severe sepsis without septic shock: Secondary | ICD-10-CM

## 2021-11-18 DIAGNOSIS — G4733 Obstructive sleep apnea (adult) (pediatric): Secondary | ICD-10-CM | POA: Diagnosis present

## 2021-11-18 DIAGNOSIS — R159 Full incontinence of feces: Secondary | ICD-10-CM

## 2021-11-18 DIAGNOSIS — I888 Other nonspecific lymphadenitis: Secondary | ICD-10-CM

## 2021-11-18 DIAGNOSIS — G9341 Metabolic encephalopathy: Secondary | ICD-10-CM | POA: Diagnosis present

## 2021-11-18 DIAGNOSIS — G039 Meningitis, unspecified: Secondary | ICD-10-CM

## 2021-11-18 DIAGNOSIS — R591 Generalized enlarged lymph nodes: Secondary | ICD-10-CM

## 2021-11-18 DIAGNOSIS — D8689 Sarcoidosis of other sites: Secondary | ICD-10-CM | POA: Diagnosis present

## 2021-11-18 DIAGNOSIS — I10 Essential (primary) hypertension: Secondary | ICD-10-CM | POA: Diagnosis present

## 2021-11-18 DIAGNOSIS — E785 Hyperlipidemia, unspecified: Secondary | ICD-10-CM | POA: Diagnosis present

## 2021-11-18 DIAGNOSIS — J9601 Acute respiratory failure with hypoxia: Secondary | ICD-10-CM

## 2021-11-18 DIAGNOSIS — R32 Unspecified urinary incontinence: Secondary | ICD-10-CM | POA: Diagnosis present

## 2021-11-18 DIAGNOSIS — E782 Mixed hyperlipidemia: Secondary | ICD-10-CM | POA: Diagnosis present

## 2021-11-18 DIAGNOSIS — R825 Elevated urine levels of drugs, medicaments and biological substances: Secondary | ICD-10-CM | POA: Diagnosis present

## 2021-11-18 DIAGNOSIS — E872 Acidosis, unspecified: Secondary | ICD-10-CM | POA: Diagnosis present

## 2021-11-18 DIAGNOSIS — M5416 Radiculopathy, lumbar region: Secondary | ICD-10-CM

## 2021-11-18 DIAGNOSIS — D759 Disease of blood and blood-forming organs, unspecified: Secondary | ICD-10-CM

## 2021-11-18 DIAGNOSIS — A419 Sepsis, unspecified organism: Secondary | ICD-10-CM

## 2021-11-18 LAB — CSF CELL COUNT WITH DIFFERENTIAL
Eosinophils, CSF: 0 % (ref 0–1)
Lymphs, CSF: 32 % — ABNORMAL LOW (ref 40–80)
Monocyte-Macrophage-Spinal Fluid: 47 % — ABNORMAL HIGH (ref 15–45)
RBC Count, CSF: 3 /mm3 — ABNORMAL HIGH
Segmented Neutrophils-CSF: 21 % — ABNORMAL HIGH (ref 0–6)
Tube #: 3
WBC, CSF: 18 /mm3 (ref 0–5)

## 2021-11-18 LAB — COMPREHENSIVE METABOLIC PANEL
ALT: 8 U/L (ref 0–44)
AST: 12 U/L — ABNORMAL LOW (ref 15–41)
Albumin: 3 g/dL — ABNORMAL LOW (ref 3.5–5.0)
Alkaline Phosphatase: 61 U/L (ref 38–126)
Anion gap: 8 (ref 5–15)
BUN: 16 mg/dL (ref 6–20)
CO2: 27 mmol/L (ref 22–32)
Calcium: 7.8 mg/dL — ABNORMAL LOW (ref 8.9–10.3)
Chloride: 100 mmol/L (ref 98–111)
Creatinine, Ser: 1.21 mg/dL (ref 0.61–1.24)
GFR, Estimated: >60 mL/min (ref >60)
Glucose, Bld: 103 mg/dL — ABNORMAL HIGH (ref 70–99)
Potassium: 3.5 mmol/L (ref 3.5–5.1)
Sodium: 135 mmol/L (ref 135–145)
Total Bilirubin: 1.5 mg/dL — ABNORMAL HIGH (ref 0.3–1.2)
Total Protein: 6.6 g/dL (ref 6.5–8.1)

## 2021-11-18 LAB — CBC
HCT: 34.1 % — ABNORMAL LOW (ref 39.0–52.0)
Hemoglobin: 10.6 g/dL — ABNORMAL LOW (ref 13.0–17.0)
MCH: 28.6 pg (ref 26.0–34.0)
MCHC: 31.1 g/dL (ref 30.0–36.0)
MCV: 92.2 fL (ref 80.0–100.0)
Platelets: 169 10*3/uL (ref 150–400)
RBC: 3.7 MIL/uL — ABNORMAL LOW (ref 4.22–5.81)
RDW: 13.5 % (ref 11.5–15.5)
WBC: 7.6 10*3/uL (ref 4.0–10.5)
nRBC: 0 % (ref 0.0–0.2)

## 2021-11-18 LAB — PHOSPHORUS: Phosphorus: 4.6 mg/dL (ref 2.5–4.6)

## 2021-11-18 LAB — MAGNESIUM: Magnesium: 1.5 mg/dL — ABNORMAL LOW (ref 1.7–2.4)

## 2021-11-18 LAB — C-REACTIVE PROTEIN: CRP: 2.7 mg/dL — ABNORMAL HIGH (ref <1.0)

## 2021-11-18 LAB — GLUCOSE, CAPILLARY: Glucose-Capillary: 103 mg/dL — ABNORMAL HIGH (ref 70–99)

## 2021-11-18 LAB — LACTIC ACID, PLASMA: Lactic Acid, Venous: 1.4 mmol/L (ref 0.5–1.9)

## 2021-11-18 LAB — PROTEIN AND GLUCOSE, CSF
Glucose, CSF: 58 mg/dL (ref 40–70)
Total  Protein, CSF: 151 mg/dL — ABNORMAL HIGH (ref 15–45)

## 2021-11-18 LAB — MRSA NEXT GEN BY PCR, NASAL: MRSA by PCR Next Gen: NOT DETECTED

## 2021-11-18 LAB — SEDIMENTATION RATE: Sed Rate: 40 mm/hr — ABNORMAL HIGH (ref 0–16)

## 2021-11-18 LAB — CRYPTOCOCCAL ANTIGEN, CSF: Crypto Ag: NEGATIVE

## 2021-11-18 IMAGING — RF DG SPINAL PUNCT LUMBAR DIAG WITH FL CT GUIDANCE
3 series · 3 of 3 positions shown · non-contrast
Comparison: CT head [DATE]

CLINICAL DATA: Fever, altered mental status, acute metabolic
encephalopathy, question meningitis

EXAM:
DIAGNOSTIC LUMBAR PUNCTURE UNDER FLUOROSCOPIC GUIDANCE

[Series 1: cp_standard · 0.17mm/px · 1 of 1 slices shown (1 of 3)]
[im 1/1]
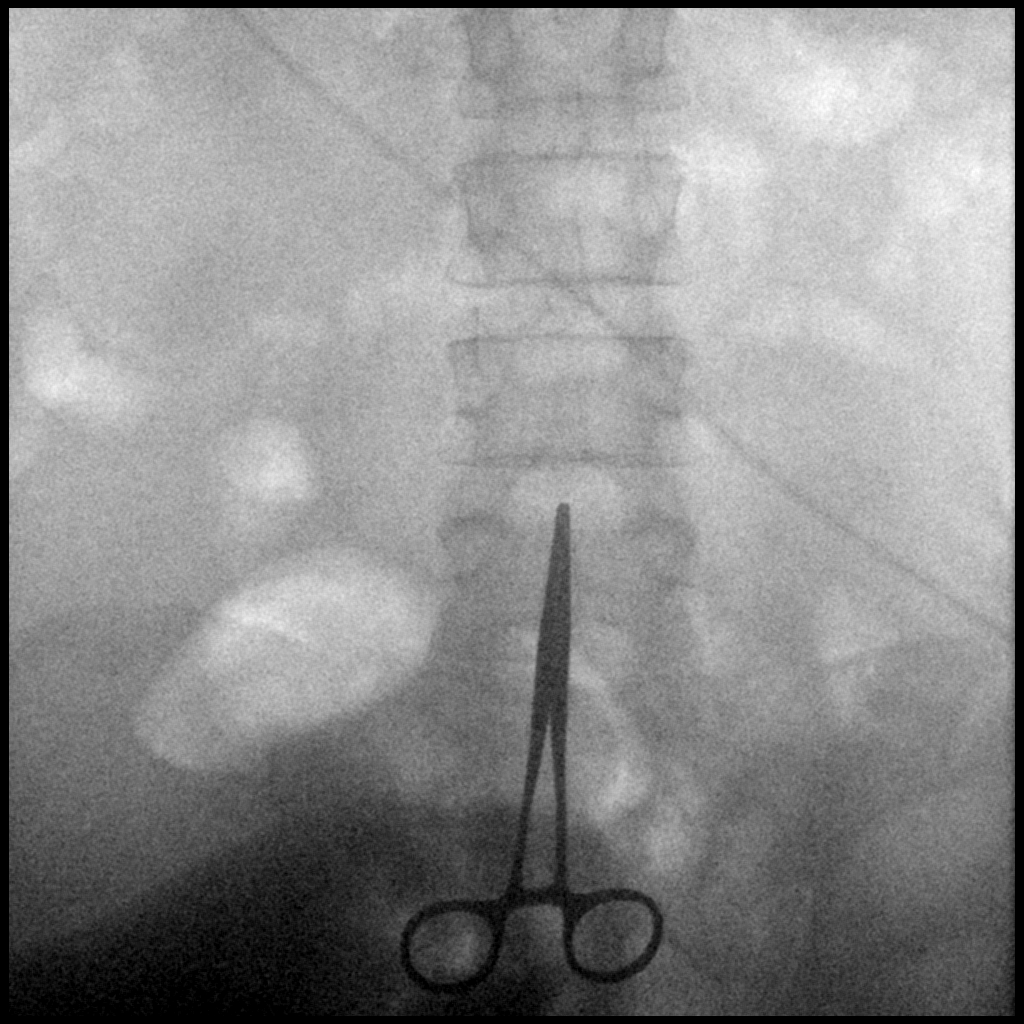

[Series 2: cp_standard · 0.17mm/px · 1 of 1 slices shown (2 of 3)]
[im 1/1]
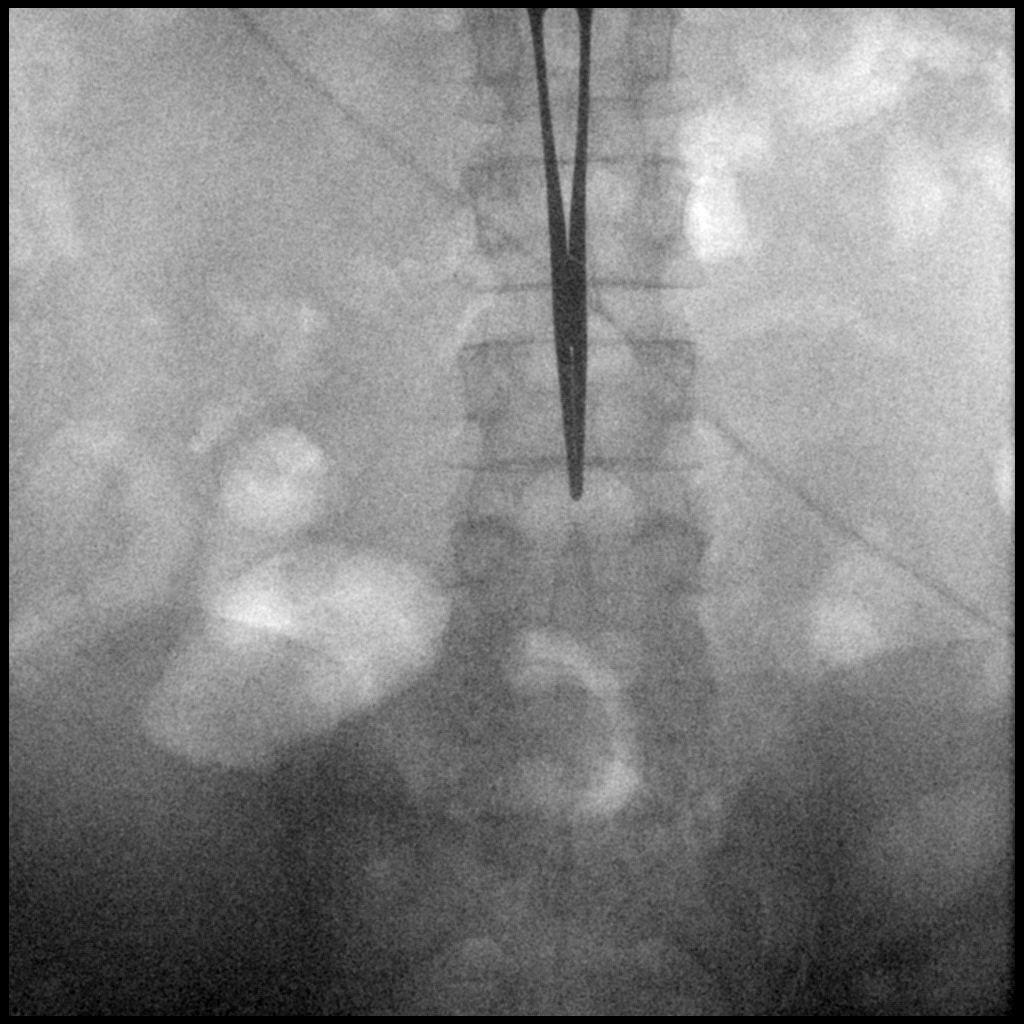

[Series 3: cp_standard · 0.17mm/px · 1 of 1 slices shown (3 of 3)]
[im 1/1]
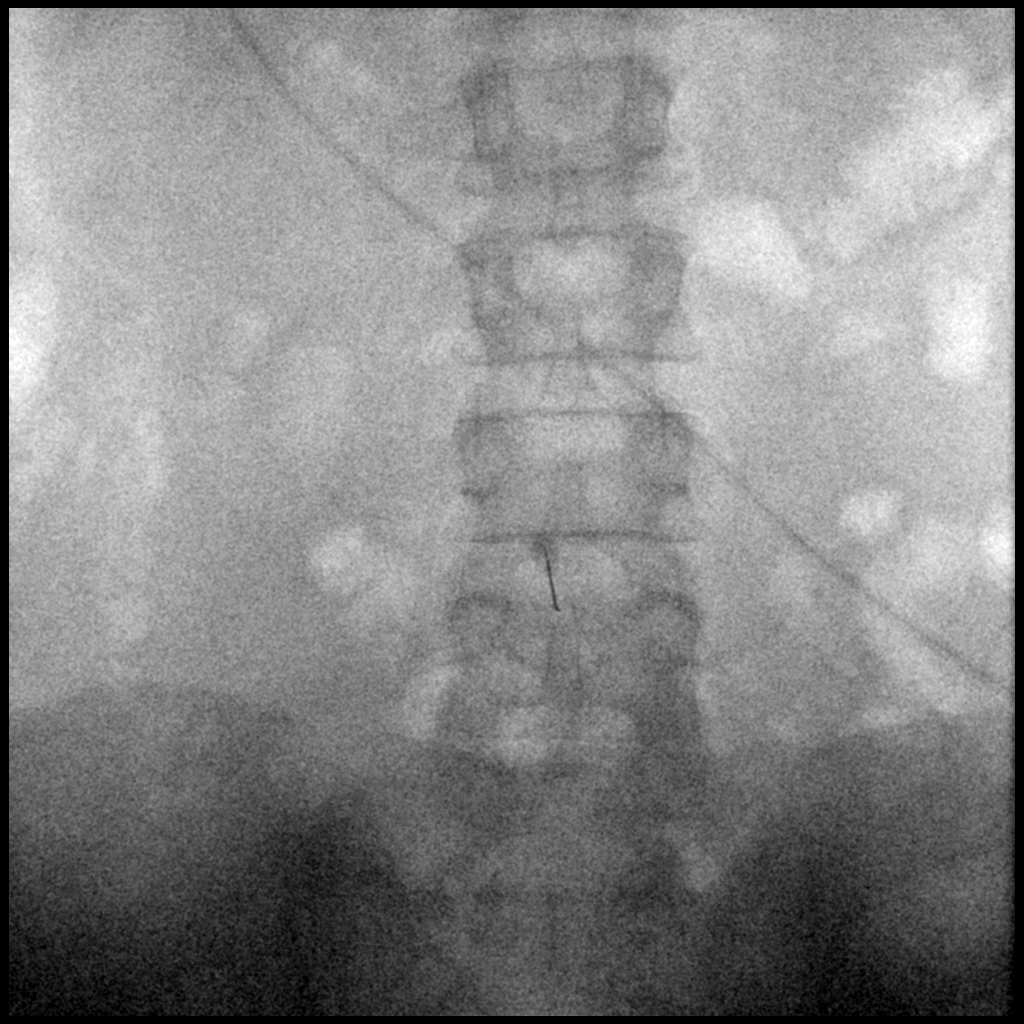

[3 of 3 positions shown; findings below may reference images not displayed]

FLUOROSCOPY:
Fluoroscopy Time:  0 minutes 12 seconds

Radiation Exposure Index (if provided by the fluoroscopic device):
5.4 mGy

Number of Acquired Spot Images: 3 fluoroscopic screen captures

PROCEDURE:
Informed consent was obtained from the patient prior to the
procedure, including potential complications of headache, allergy,
and pain. With the patient prone, the lower back was prepped with
Betadine. 1% Lidocaine was used for local anesthesia. Lumbar
puncture was performed at the L4-L5 level using a 22 gauge needle
with return of clear colorless CSF. 9 mL of CSF were obtained for
laboratory studies. The patient tolerated the procedure well and
there were no apparent complications.
IMPRESSION: Successful fluoroscopic guided lumbar puncture.

## 2021-11-18 MED ORDER — LABETALOL HCL 5 MG/ML IV SOLN
5.0000 mg | Freq: Four times a day (QID) | INTRAVENOUS | Status: DC | PRN
Start: 2021-11-18 — End: 2021-11-18
  Administered 2021-11-18: 5 mg via INTRAVENOUS
  Filled 2021-11-18: qty 4

## 2021-11-18 MED ORDER — CHLORHEXIDINE GLUCONATE CLOTH 2 % EX PADS
6.0000 | MEDICATED_PAD | Freq: Every day | CUTANEOUS | Status: DC
  Administered 2021-11-18 – 2021-11-20 (×3): 6 via TOPICAL

## 2021-11-18 MED ORDER — KETOROLAC TROMETHAMINE 15 MG/ML IJ SOLN
15.0000 mg | Freq: Four times a day (QID) | INTRAMUSCULAR | Status: DC | PRN
  Administered 2021-11-19: 15 mg via INTRAVENOUS
  Filled 2021-11-18: qty 1

## 2021-11-18 MED ORDER — ENOXAPARIN SODIUM 60 MG/0.6ML IJ SOSY
60.0000 mg | PREFILLED_SYRINGE | INTRAMUSCULAR | Status: DC
  Administered 2021-11-19 – 2021-11-21 (×2): 60 mg via SUBCUTANEOUS
  Filled 2021-11-18 (×2): qty 0.6

## 2021-11-18 MED ORDER — ENOXAPARIN SODIUM 60 MG/0.6ML IJ SOSY: 60.0000 mg | PREFILLED_SYRINGE | INTRAMUSCULAR | Status: DC

## 2021-11-18 MED ORDER — LABETALOL HCL 5 MG/ML IV SOLN
10.0000 mg | INTRAVENOUS | Status: DC | PRN
  Administered 2021-11-18 – 2021-11-19 (×5): 10 mg via INTRAVENOUS
  Filled 2021-11-18 (×7): qty 4

## 2021-11-18 MED ORDER — ACETAMINOPHEN 325 MG PO TABS
650.0000 mg | ORAL_TABLET | Freq: Four times a day (QID) | ORAL | Status: DC | PRN
  Administered 2021-11-18 – 2021-11-19 (×2): 650 mg via ORAL
  Filled 2021-11-18 (×2): qty 2

## 2021-11-18 MED ORDER — ACYCLOVIR SODIUM 50 MG/ML IV SOLN
INTRAVENOUS | Status: AC
  Filled 2021-11-18: qty 10

## 2021-11-18 MED ORDER — MAGNESIUM SULFATE 4 GM/100ML IV SOLN
4.0000 g | Freq: Once | INTRAVENOUS | Status: AC
  Administered 2021-11-18: 4 g via INTRAVENOUS
  Filled 2021-11-18: qty 100

## 2021-11-18 MED ORDER — KETOROLAC TROMETHAMINE 15 MG/ML IJ SOLN
15.0000 mg | Freq: Once | INTRAMUSCULAR | Status: AC
  Administered 2021-11-18: 15 mg via INTRAVENOUS
  Filled 2021-11-18: qty 1

## 2021-11-18 MED ORDER — KETOROLAC TROMETHAMINE 30 MG/ML IJ SOLN
30.0000 mg | Freq: Once | INTRAMUSCULAR | Status: AC
  Administered 2021-11-18: 30 mg via INTRAVENOUS
  Filled 2021-11-18: qty 1

## 2021-11-18 MED ORDER — LACTATED RINGERS IV SOLN: INTRAVENOUS | Status: DC

## 2021-11-18 NOTE — Assessment & Plan Note (Addendum)
Urine drug screen was positive for benzodiazepine, Ativan was given in the ED, however, it was also positive for opiates, it is unknown if patient received opioid med prior to arrival to the ED  ?

## 2021-11-18 NOTE — Assessment & Plan Note (Signed)
It is unknown if patient uses CPAP at home, he was noted to be hypoxic on room air ?Continue supplemental oxygen to maintain O2 sat > 92% ?

## 2021-11-18 NOTE — Assessment & Plan Note (Addendum)
Sepsis physiology has resolved now.  No evidence of bacterial infection has been found.   ?

## 2021-11-18 NOTE — Consult Note (Signed)
I connected with  James Hartman on 11/18/21 by a video enabled telemedicine application and verified that I am speaking with the correct person using two identifiers. ?  ?I discussed the limitations of evaluation and management by telemedicine. The patient expressed understanding and agreed to proceed. ? ?Location of patient: [facility] ?Location of physician: [facility] ? ?Neurology Consultation ?Reason for Consult: ams ?Referring Physician: Dr Irwin Brakeman ? ?CC: AMS ? ?History is obtained from: chart review ? ?HPI: James Hartman is a 46 y.o. male with medical history of hypertension, sleep apnea and 1 year history of worsening bilateral lower extremity weakness, paresthesias, bowel and bladder incontinence and diffuse lymphadenopathy who was admitted with fever, altered mental status.  Patient states he remembers having difficulty getting the words out yesterday.  He was brought into the emergency room and had initial temperature of 104 Fahrenheit.  Sepsis work-up was ordered and patient was started on cefepime as well as acyclovir for meningitis coverage.  Patient continues to be febrile with most recent Tmax of 101.3 Fahrenheit.  Lumbar puncture was performed under fluoroscopy guidance this morning which showed CSF clear cytosis with 18 WBCs, 70% neutrophilic, 01% lymphocytic and 47% monocytic.  CSF protein and glucose and rest of the labs are still pending.  Neurology was consulted for further recommendations. ? ?Per chart review, patient initially presented to the emergency room on 02/17/2021 with right leg pain for about 3 weeks described as intermittent pain in the anterior right thigh down into the foot.  He denied any incontinence, weakness in legs.  X-ray of right knee was completed which did not show any acute abnormality.  Recommended follow-up with primary care physician.  Patient was then referred for EMG with nerve conduction study due to concern for lumbosacral  radiculopathy.  EMG/nerve conduction study was performed on 04/07/2021 which showed chronic L5 radiculopathy, mild.  Patient was referred for outpatient rehab as well as MRI lumbar spine.  MRI lumbar spine showed bone marrow edema within bilateral sacral ala without well-defined fracture line concerning for sacral insufficiency fractures versus marrow infiltrative process.  Additionally there were multiple enlarged retroperitoneal lymph nodes concerning for lymphoma proliferative process or metastatic disease.  MRI pelvis and CT chest Abdo pelvis were recommended which were then performed and were concerning for lymphoma versus sarcoidosis.  Patient was then referred to oncology and was seen by Dr.Katragadda.  During this visit, patient reported numbness around his waist, legs and feet, weakness resulting in falls, rectal dysfunction as well as urinary and fecal incontinence starting about 1 year ago.  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  Pulmonary nodularity in the lymphatic distribution was also noted differentials of which include pulmonary lymphoma or concurrent sarcoidosis.  Serum ACE was minimally elevated at 89.  ESR was 45, CRP 1.5, LDH was normal at 133.  Beta-2 microglobulin was minimally elevated at 3.1.  Multiple myeloma work-up was also performed: M spike was not detected, immunofixation showed polyclonal with free light chain ratio increased to 1.83.  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 ? ?ROS: All other systems reviewed and negative except as noted in the HPI.  ? ?Past Medical History:  ?Diagnosis Date  ? Hypertension   ? Sleep apnea   ? ? ?History reviewed.  Patient's mother had cancer, maternal  grandfather had  prostate cancer, maternal aunt had breast cancer. ? ?Social History:  reports that he has quit smoking about 20 years ago. He has never used smokeless tobacco. He reports that he does not currently use alcohol. He reports that he does not use drugs. ? ? ?Medications Prior to Admission  ?Medication Sig Dispense Refill Last Dose  ? albuterol (VENTOLIN HFA) 108 (90 Base) MCG/ACT inhaler Inhale 1-2 puffs into the lungs every 6 (six) hours as needed for wheezing or shortness of breath.   11/17/2021  ? azithromycin (ZITHROMAX) 250 MG tablet Take 1 tablet (250 mg total) by mouth daily. Take first 2 tablets together, then 1 every day until finished. (Patient not taking: Reported on 11/12/2021) 6 tablet 0 Completed Course  ? DULoxetine (CYMBALTA) 60 MG capsule Take 60 mg by mouth in the morning, at noon, and at bedtime.     ? FLOVENT HFA 220 MCG/ACT inhaler Inhale 1 puff into the lungs daily as needed (shortness of breath or wheezing).     ? fluticasone (FLONASE) 50 MCG/ACT nasal spray Place 1 spray into both nostrils daily as needed for allergies or rhinitis.     ? folic acid (FOLVITE) 1 MG tablet Take 1 mg by mouth daily.     ? gabapentin (NEURONTIN) 300 MG capsule Take 300 mg by mouth 3 (three) times daily.     ? hydrochlorothiazide (HYDRODIURIL) 12.5 MG tablet Take 12.5 mg by mouth daily.     ? HYDROcodone-acetaminophen (NORCO) 5-325 MG tablet Take 1 tablet by mouth every 4 (four) hours as needed for moderate pain. 20 tablet 0   ? ibuprofen (ADVIL) 200 MG tablet Take 400 mg by mouth every 6 (six) hours as needed for mild pain.     ? pravastatin (PRAVACHOL) 40 MG tablet Take 40 mg by mouth daily.     ? sodium chloride (OCEAN) 0.65 % SOLN nasal spray Place 1 spray into both nostrils as needed for congestion.     ? valsartan (DIOVAN) 80 MG tablet Take 80 mg by mouth daily.     ?  ? ? ?Exam: ?Current vital signs: ?BP (!) 162/75   Pulse 89   Temp (!) 101.3 ?F (38.5 ?C) (Bladder)   Resp (!) 26   Ht '5\' 8"'  (1.727 m)   Wt 127.5  kg   SpO2 100%   BMI 42.73 kg/m?  ?Vital signs in last 24 hours: ?Temp:  [98.6 ?F (37 ?C)-104.4 ?F (40.2 ?C)] 101.3 ?F (38.5 ?C) (03/15 1200) ?Pulse Rate:  [89-120] 89 (03/15 0400) ?Resp:  [19-38] 26 (03/15 1200) ?BP: (116-208)/(51-150) 162/75 (03/15 1200) ?SpO2:  [87 %-100 %] 100 % (03/15 1200) ?FiO2 (%):  [28 %] 28 % (03/15 0115) ?Weight:  [127.5 kg] 127.5 kg (03/14 1846) ? ? ?Physical Exam  ?Constitutional: Appears well-developed and well-nourished.  ?Psych: Affect appropriate to situation ?Eyes: No scleral injection ?Neuro: AOx3, no evidence of aphasia, PERRLA, EOMI, no facial asymmetry, antigravity strength in all 4 extremities without drift, FTN intact, left upper extremity intentional tremor, denies any sensory change to touch on face bilaterally ? ? ?I have reviewed labs in epic and the results pertinent to this consultation are: ?CBC:  ?Recent Labs  ?Lab 11/17/21 ?1751 11/18/21 ?0354  ?WBC 7.8 7.6  ?NEUTROABS 5.9  --   ?HGB 12.5* 10.6*  ?HCT 38.4* 34.1*  ?MCV 91.6 92.2  ?PLT 225 169  ? ? ?Basic Metabolic Panel:  ?Lab Results  ?Component Value Date  ? NA 135 11/18/2021  ?  K 3.5 11/18/2021  ? CO2 27 11/18/2021  ? GLUCOSE 103 (H) 11/18/2021  ? BUN 16 11/18/2021  ? CREATININE 1.21 11/18/2021  ? CALCIUM 7.8 (L) 11/18/2021  ? GFRNONAA >60 11/18/2021  ? ?Lipid Panel: No results found for: [place] ?HgbA1c: No results found for: HGBA1C ?Urine Drug Screen:  ?   ?Component Value Date/Time  ? LABOPIA POSITIVE (A) 11/17/2021 2051  ? [place] DETECTED 11/17/2021 2051  ? LABBENZ POSITIVE (A) 11/17/2021 2051  ? AMPHETMU NONE DETECTED 11/17/2021 2051  ? [place] DETECTED 11/17/2021 2051  ? [place] DETECTED 11/17/2021 2051  ?  ?Alcohol Level No results found for: ETH ? ? ?I have reviewed the images obtained: ? ?MRI brain without contrast 11/18/2021: ?1.No acute infarct. ?2. Extensive cerebral white matter disease, nonspecific. Considerations include markedly age advanced chronic small vessel ischemic,  infectious and inflammatory/granulomatous processes including sarcoidosis, demyelinating disease, vasculitis, and toxic/metabolic insults. ?3. Known widespread bone lesions. ? ?CT head without contrast 11/17/2021: ?1. [place]flu

## 2021-11-18 NOTE — Assessment & Plan Note (Addendum)
Biospy pathology shown.  Discussed with pulmonary, ID, neurology, no need to urgently start steroids at this time.  See consultant notes.   He has follow up with Dr. Ellin Saba on 3/20.  ?

## 2021-11-18 NOTE — Progress Notes (Signed)
?PROGRESS NOTE ? ? ?James Hartman  [id number] DOB: [birth year] DOA: 11/17/2021 ?PCP: [name], MD  ? ?Chief Complaint  ?Patient presents with  ? Fever  ? Altered Mental Status  ? ?Level of care: Stepdown ? ?Brief Admission History:  ?46 y.o. male with medical history significant of hypertension, hyperlipidemia and obstructive sleep apnea who presents to the emergency department via EMS due to fever and altered mental status.  Patient was lethargic, though easily arousable with mild sternal rub, but he only opens his eyes, looks around and quickly goes back to sleep, he was unable to provide a history.  History was obtained from ED physician and ED medical record.  Per report, patient has poor communication Abilities at baseline.  EMS was activated at home, on arrival of EMS, patient was noted to have a fever of 104F axillary.  Patient has generalized lymphadenopathy and had a biopsy on 3/13 to rule out sarcoidosis/lymphoma.  The cause of the fever was unknown, IV hydration of 500 mL of LR was given en route to the ED.   ?  ?ED course: ?In the emergency department,Patient was febrile, tachypneic, tachycardic, BP was 191/100.  Work-up in the ED shows normocytic anemia, BUNs/creatinine 14/1.33 (baseline creatinine at 0.9-1.1), ABG showed hypoxia, lactic acid 2.1 > 4.6 > 4.2 > 1.4, urinalysis was positive for proteinuria and hematuria, but was unimpressive for UTI, procalcitonin 0.30, urine drug screen was positive for opiates and benzodiazepines. 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  Chest x-ray showed no definite pneumonia.  Patient was treated with IV vancomycin and cefepime, Tylenol  was given and cooling blanket was placed due to fever.  IV labetalol 20 mg x 1 was given due to high blood pressure, patient  was provided with IV LR per sepsis protocol, Ativan and Flagyl were given as well.  Patient was also treated with Zovirax to cover for possible viral meningitis.   Patient was seen in the ED by Dr. Constance Haw and on examination of the incision, it does not appear to be the cause of patient's septic state, there were no rashes or signs of cellulitis on the body per medical record.   Hospitalist was asked to admit patient for further evaluation and management. ?  ?Assessment and Plan: ?* Severe sepsis ([place]) ?Secondary to acute meningitis.  ID consult requested and appreciate recommendations.   ? 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  Discussed with pulmonary, ID, neurology, no need to urgently start steroids at this time.  See consultant notes.   ? ?Meningitis ?Appreciate neurology consult and recommendations.  Follow up on outstanding studies.  ? ?Altered mental status ?Slowly improving with supportive measures.  ? ?Lactic acidosis ?Lactic acid 2.1 > 4.6 > 4.2 > 1.4-resolved ? ? ?Fever ?Cooling blanket ordered.   ? ?Obstructive sleep apnea ?It is unknown if patient uses CPAP at home, he was noted to be hypoxic on room air ?Continue supplemental oxygen to maintain O2 sat > 92% ? ?Hyperlipidemia ?Resume home meds when patient is alert enough to be able to tolerate oral intake ? ?Essential hypertension ?Continue IV labetalol as needed for SBP > 160 ? ?Positive urine drug screen ?Urine drug screen was positive for benzodiazepine, Ativan was given in the ED, however, it was also positive for opiates, it is unknown if patient received opioid med prior to arrival to the ED  ?  Continue to monitor and treat accordingly ? ?Acute respiratory failure with hypoxia ([place]) ?Continue supplemental oxygen to obtain O2 sat greater than 92% ? ?Generalized lymphadenopathy ?Patient had a biopsy on 3/13 with findings of granulomatous lymphadenitis.  ? ? ?DVT prophylaxis: enoxaparin ?Code Status: full  ?Family Communication:   ?Disposition: Status is: Inpatient ?Remains inpatient appropriate because: IV antibiotics, lumbar puncture ?  ?Consultants:  ?Neuro ?ID ?PCCM  ?Procedures:  ?LP fluroscopy 3/15 >> ?Antimicrobials:  ?See MAR ?Subjective: ?Pt remains confused but more alert than on arrival.  ?Objective: ?Vitals:  ? 11/18/21 1358 11/18/21 1500 11/18/21 1600 11/18/21 1630  ?BP: (!) 165/76 (!) 151/72 133/68   ?Pulse: 90 80 80   ?Resp: (!) 22 (!) 26 (!) 23   ?Temp: 100.2 ?F (37.9 ?C)  99.1 ?F (37.3 ?C) 98.5 ?F (36.9 ?C)  ?TempSrc: Bladder  Bladder Oral  ?SpO2: 98% 100% 98%   ?Weight:      ?Height:      ? ? ?Intake/Output Summary (Last 24 hours) at 11/18/2021 1742 ?Last data filed at 11/18/2021 1541 ?Gross per 24 hour  ?Intake 4173.64 ml  ?Output 1800 ml  ?Net 2373.64 ml  ? ?Filed Weights  ? 11/17/21 1846  ?Weight: 127.5 kg  ? ?Examination: ? ?General exam: Pt confused but arousable, oriented to person and place, Appears calm and comfortable  ?Respiratory system: Clear to auscultation. Respiratory effort normal. ?Cardiovascular system: normal S1 & S2 heard. No JVD, murmurs, rubs, gallops or clicks. No pedal edema. ?Gastrointestinal system: Abdomen is nondistended, soft and nontender. No organomegaly or masses felt. Normal bowel sounds heard. ?Central nervous system: somnolent but arousable. No focal neurological deficits. ?Extremities: Symmetric 5 x 5 power. ?Skin: No rashes, lesions or ulcers. ?Psychiatry: Judgement and insight appear confused.  Mood & affect UTD.  ? ?Data Reviewed: I have personally reviewed following labs and imaging studies ? ?CBC: ?Recent Labs  ?Lab 11/17/21 ?1751 11/18/21 ?0354  ?WBC 7.8 7.6  ?NEUTROABS 5.9  --   ?HGB 12.5* 10.6*  ?HCT 38.4* 34.1*  ?MCV 91.6 92.2  ?PLT 225 169  ? ? ?Basic Metabolic Panel: ?Recent Labs  ?Lab 11/17/21 ?1751 11/18/21 ?0354  ?NA 135 135  ?K 3.8 3.5  ?CL 98 100  ?CO2 27 27  ?GLUCOSE 100* 103*  ?BUN 14 16  ?CREATININE 1.33* 1.21  ?CALCIUM 8.5* 7.8*  ?MG  --  1.5*  ?PHOS  --  4.6   ? ? ?CBG: ?Recent Labs  ?Lab 11/18/21 ?0206  ?GLUCAP 103*  ? ? ?Recent Results (from the past 240 hour(s))  ?Blood Culture (routine x 2)     Status: None (Preliminary result)  ? Collection Time: 11/17/21  5:51 PM  ? Specimen: Peripheral; Blood  ?Result Value Ref Range Status  ? Specimen Description RIGHT ANTECUBITAL  Final  ? Special Requests   Final  ?  BOTTLES DRAWN AEROBIC AND ANAEROBIC Blood Culture adequate volume ?Performed at [facility], [address], [place], [place] [address] ?  ? Culture PENDING  Incomplete  ? Report Status PENDING  Incomplete  ?Blood Culture (routine x 2)     Status: None (Preliminary result)  ? Collection Time: 11/17/21  6:04 PM  ? Specimen: Peripheral; Blood  ?Result Value Ref Range Status  ? Specimen Description BLOOD RIGHT ARM  Final  ? Special Requests   Final  ?  BOTTLES DRAWN AEROBIC AND ANAEROBIC Blood Culture adequate volume ?Performed at [facility], [address], [place], [place] [address] ?  ? Culture PENDING  Incomplete  ? Report Status PENDING  Incomplete  ?Resp Panel by RT-PCR (Flu A&B, Covid) Nasopharyngeal Swab     Status: None  ? Collection Time: 11/17/21  7:00 PM  ? Specimen: Nasopharyngeal Swab; Nasopharyngeal(NP) swabs in vial transport medium  ?Result Value Ref Range Status  ? SARS Coronavirus 2 by RT PCR NEGATIVE NEGATIVE Final  ?  Comment: (NOTE) ?SARS-CoV-2 target nucleic acids are NOT DETECTED. ? ?The SARS-CoV-2 RNA is generally detectable in upper respiratory ?specimens during the acute phase of infection. The lowest ?concentration of SARS-CoV-2 viral copies this assay can detect is ?138 copies/mL. A negative result does not preclude SARS-Cov-2 ?infection and should not be used as the sole basis for treatment or ?other patient management decisions. A negative result may occur with  ?improper specimen collection/handling, submission of specimen other ?than nasopharyngeal swab, presence of viral mutation(s) within the ?areas targeted by this assay, and  inadequate number of viral ?copies(<138 copies/mL). A negative result must be combined with ?clinical observations, patient history, and epidemiological ?information. The expected result is Negative. ? ?Fact Sheet for [employer]

## 2021-11-18 NOTE — Hospital Course (Addendum)
46 y.o. male with medical history significant of hypertension, hyperlipidemia and obstructive sleep apnea who presents to the emergency department via EMS due to fever and altered mental status.  Patient was lethargic, though easily arousable with mild sternal rub, but he only opens his eyes, looks around and quickly goes back to sleep, he was unable to provide a history.  History was obtained from ED physician and ED medical record.  Per report, patient has poor communication Abilities at baseline.  EMS was activated at home, on arrival of EMS, patient was noted to have a fever of 104F axillary.  Patient has generalized lymphadenopathy and had a biopsy on 3/13 to rule out sarcoidosis/lymphoma.  The cause of the fever was unknown, IV hydration of 500 mL of LR was given en route to the ED.   ?  ?ED course: ?In the emergency department,Patient was febrile, tachypneic, tachycardic, BP was 191/100.  Work-up in the ED shows normocytic anemia, BUNs/creatinine 14/1.33 (baseline creatinine at 0.9-1.1), ABG showed hypoxia, lactic acid 2.1 > 4.6 > 4.2 > 1.4, urinalysis was positive for proteinuria and hematuria, but was unimpressive for UTI, procalcitonin 0.30, urine drug screen was positive for opiates and benzodiazepines. 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  Chest x-ray showed no definite pneumonia.  Patient was treated with IV vancomycin and cefepime, Tylenol  was given and cooling blanket was placed due to fever.  IV labetalol 20 mg x 1 was given due to high blood pressure, patient was provided with IV LR per sepsis protocol, Ativan and Flagyl were given as well.  Patient was also treated with Zovirax to cover for possible viral meningitis.   Patient was seen in the ED by Dr. Henreitta Leber and on examination of the incision, it  does not appear to be the cause of patient's septic state, there were no rashes or signs of cellulitis on the body per medical record.   Hospitalist was asked to admit patient for further evaluation and management. ? ?11/19/2021: mentation is slowly improving to baseline.  ID planning repeat LP with large volume to send fluid for other testing.   ? ?11/20/2021: Large volume LP completed, additional testing sent per ID recommendations.  Pt's mentation continues to improve.   ? ?11/21/2021: Pt feels back to his baseline mentation, no headache or back pain.  No fever or chills.  He would like to go home today.  He will follow up with Dr. Daiva Eves and Dr. Ellin Saba.   ?

## 2021-11-18 NOTE — Assessment & Plan Note (Addendum)
RESOLVED. 

## 2021-11-18 NOTE — Consult Note (Addendum)
Virtual Visit via Video Note  I connected with James Hartman today at  by a video enabled telemedicine application and verified that I am speaking with the correct person using two identifiers.  Location: Patient: ICU [facility] Provider: Home   I discussed the limitations of evaluation and management by telemedicine and the availability of in person appointments. The patient expressed understanding and agreed to proceed.   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 Would send CSF for MTB PCR Would send CSF for histoplasma antigen and Blastomyces antigen (I have added cryptococcal antigen to the remaining half milliliters cc in micro lab) VDRL Dedicated 10ml of CSF for AFB stain and culture Dedicated 10 mL of CSF for fungal stain and culture with cultures For 6 weeks I will also check urine histoplasma antigen and Blastomyces antigen, cryptococcal serum antigen, quantiferon gold, RPR and HIV RNA I will also endeavor to have his lymph node sent out to [facility] in [place] for PCR for mycobacteria fungi, T. Whipplei, Bartonella and broad ranging bacterial PCR (though I doubt any of these here) I am supportive of corticosteroid trial  Principal Problem:   Severe  sepsis (HCC) Active Problems:   Generalized lymphadenopathy   Acute respiratory failure with hypoxia (HCC)   Positive urine drug screen   Lactic acidosis   Altered mental status   Essential hypertension   Hyperlipidemia   Obstructive sleep apnea   Fever   Meningitis   Granulomatous lymphadenitis   Scheduled Meds:  Chlorhexidine Gluconate Cloth  6 each Topical Daily   [START ON 11/19/2021] enoxaparin (LOVENOX) injection  60 mg Subcutaneous Q24H   ketorolac  15 mg Intravenous Once   Continuous Infusions:  lactated ringers 125 mL/hr at 11/18/21 5956   vancomycin 2,000 mg (11/18/21 1629)   PRN Meds:.acetaminophen, labetalol  HPI: James Hartman is a 46 y.o. male black man with a history of hypertension sleep apnea obesity who has had a 1 year history of worsening bilateral lower extremity weakness with paresthesias along with bowel and bladder incontinence.  He also was found to have diffuse lymphadenopathy.  Patient had initially been seen in the ER in June 2022 with right leg pain intermittent pain in the anterior right thigh down to the foot.  He was referred ultimately for EMG and nerve conduction study with concern for lumbosacral radiculopathy nerve conduction was performed and showed a chronic L5 radiculopathy.  He underwent MRI of the spine which showed bone marrow edema within the bilateral sacral ala a fracture line in the sacrum versus infiltrative process.  There are multiple enlarged retroperitoneal lymph nodes seen.  Patient had MRI of the pelvis and a CT of the chest abdomen pelvis which showed extensive lymphadenopathy thought potentially due to a lymphoproliferative process lymphoma versus sarcoidosis.  Patient was referred to oncology and saw Dr. Ellin Saba  but that time he developed numbness around his waist legs and feet as well as weakness that had caused several falls.  He was having problems with urinary and fecal incontinence as well but it continued.  Patient  underwent PET scan which showed hypermetabolic adenopathy throughout the neck chest abdomen pelvis as well as a heterogenous bone marrow with hypermetabolism.  His serum angiotensin-converting enzyme was checked and was elevated at 89.  Sed rate was up at 45 LDH was normal.  Multiple myeloma work-up was undertaken which was negative for M spike.  He underwent axillary lymph node dissection on November 16, 2021.  Pathology shows granulomatous lymphadenitis.  Smears for AFB and fungal Arst were still pending on pathology when I looked at the report.  There also no specific monoclonal B cells or abnormal T cells seen on biopsy.  Patient then became confused and was having difficulty with words.  He was brought to the emergency room was found to be febrile to 104 F.  He had blood cultures taken as well as a urine culture.  Respiratory panel was negative for COVID and flu.  He was begun on broad-spectrum antibiotics for possible meningoencephalitis and or sepsis it seems with vancomycin metronidazole cefepime and acyclovir.  Today underwent lumbar puncture where 9 mL of CSF were taken.  CSF profile showed a white cell count of 18 with a monocytic predominance with 47% monocytes 32% lymphocytes and 21% segmented neutrophils.  There are 3 red blood cells present.  Glucose was 58 and protein 151.  CSF Gram stain was negative.  AFB and fungal cultures were sent but with this low volume of CSF analyzed there will be unlikely to be informative.  ACE level sent on CSF.  His HIV fourth-generation antibody was negative.  MRI of the brain performed yesterday showed extensiv cerebral white matter disease along with widespread hypointense marrow in the skull and cervical spine.  Overnight his confusion has cleared and he is oriented to person and location and place and recognizes his daughter and his friend here in the intensive care unit with him.  Fevers have come down so far with a temperature  maximum in this day of over 101.  In talking to the patient and family it seems that many of his symptoms are waxing and waning with him having some "good days and some bad days in terms of his lower extremity weakness urinary and fecal incontinence.  He and his family say that he has not had any known fevers and this is the first time that he has come to the hospital with such a high temperature and with the confusion that he had yesterday while in the emergency department.  His clinical story is not consistent with a bacterial meningitis.  It is also certainly not consistent with a viral encephalitis or meningitis.  I think he clearly must have a unifying diagnosis causing all of the abnormalities described.  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 Lymphoma also possible but would seem unlikely to me  Certainly tuberculosis could present with widespread features such as his but I would not expect it to wax and wane.  It can certainly present as a subacute or chronic infectious process but the course is not waxing and waning but going in 1 direction. His lack of stereotypical weight loss, fevers (until now) night sweats would make this not likely  Similarly it is possible he could have disseminated fungal infection with a dimorphic fungus such as cryptococcus histoplasma or Blastomyces.  These infections can certainly happen in normal host but it would be unusual to see such widespread pathology in him.  He is notably HIV seronegative.  He does not have any history of recurrent infections.   He has not been exposed to anyone with tuberculosis.  His travel history is confined entirely to the state of [place].  He lives in a house but without known issues with rodents.  There are no pets at home.  He has not worked on a farm or been exposed to  Physicist, medical giving birth.  He has not been involved in the slaughtering of animals is not a Therapist, nutritional.  He has had no tick bites recently.   I have added a serum cryptococcal antigen to his CSF but there was only one half of a milliliter remaining.  As mentioned above if the team feels that it is important to exclude an infectious cause of his pathology and in particular his CNS pathology he will need a repeat large volume lumbar puncture.  If this is performed I would recommend filling all 4 tubes with spinal fluid so that at least 30 to 32 mL can be collected.  I would then recommend sending CSF for MTB by PCR, histoplasma antigen and Blastomyces antigen (send out labs to [facility]).I have added crypto ag to CSF that was remaining that will run tonight.  I would recommend using a dedicated 8 to 10 mL of CSF to send for AFB stain and culture.  I would simply recommend a dedicated 8 to 10 cc of CSF sent for fungal culture.  We can also asked that the pathology lab to have his lymph node section sent for for fungal PCR MTB PCR and nontuberculous mycobacterial PCR. For T whipplei Bartonella, and broad ranging bacterial PCR.  I am very skeptical that he has TB or a disseminated fungal infection.  If he needs corticosteroids I would be supportive of them being used.    I spent 125  minutes with the patient including than 50% of the time in face to face counseling of the patient and his family members regarding the work-up for his meningoencephalitis lymphadenopathy hypermetabolic bone marrow lesions radiculopathy weakness  personally reviewing MRI of the brain, PET scan CT of the chest abdomen pelvis updated microdata pathology slides along with review of medical records in preparation for the visit and during the visit and in coordination of his care with Dr. Laural Benes and Dr. Melynda Ripple      Review of Systems: Review of Systems  Constitutional:  Positive for fever and malaise/fatigue. Negative for  chills and weight loss.  HENT:  Negative for congestion and sore throat.   Eyes:  Negative for blurred vision and photophobia.  Respiratory:  Negative for cough, shortness of breath and wheezing.   Cardiovascular:  Negative for chest pain, palpitations and leg swelling.  Gastrointestinal:  Negative for abdominal pain, blood in stool, constipation, diarrhea, heartburn, melena, nausea and vomiting.  Genitourinary:  Negative for dysuria, flank pain and hematuria.  Musculoskeletal:  Negative for back pain, falls, joint pain and myalgias.  Skin:  Negative for itching and rash.  Neurological:  Positive for sensory change, focal weakness, weakness and headaches. Negative for dizziness and loss of consciousness.  Endo/Heme/Allergies:  Does not bruise/bleed easily.  Psychiatric/Behavioral:  Negative for depression and suicidal ideas. The patient does not have insomnia.  Past Medical History:  Diagnosis Date   Hypertension    Sleep apnea     Social History   Tobacco Use   Smoking status: Former   Smokeless tobacco: Never  Substance Use Topics   Alcohol use: Not Currently   Drug use: No    History reviewed. No pertinent family history. No Known Allergies  OBJECTIVE: Blood pressure (!) 138/107, pulse 88, temperature 98.5 F (36.9 C), temperature source Oral, resp. rate (!) 24, height 5\' 8"  (1.727 m), weight 127.5 kg, SpO2 92 %.  Physical Exam Constitutional:      Appearance: He is well-developed.  HENT:     Head: Normocephalic and atraumatic.  Eyes:     General:        Right eye: No discharge.        Left eye: No discharge.     Extraocular Movements: Extraocular movements intact.     Conjunctiva/sclera: Conjunctivae normal.  Cardiovascular:     Rate and Rhythm: Normal rate and regular rhythm.  Pulmonary:     Effort: Pulmonary effort is normal. No respiratory distress.     Breath sounds: No wheezing.  Abdominal:     General: There is no distension.     Palpations: Abdomen  is soft.  Musculoskeletal:        General: No tenderness. Normal range of motion.     Cervical back: Normal range of motion and neck supple.  Skin:    General: Skin is warm and dry.     Coloration: Skin is not pale.     Findings: No erythema or rash.  Neurological:     Mental Status: He is alert and oriented to person, place, and time.  Psychiatric:        Mood and Affect: Mood normal.        Behavior: Behavior normal.        Thought Content: Thought content normal.        Judgment: Judgment normal.    Lab Results Lab Results  Component Value Date   WBC 7.6 11/18/2021   HGB 10.6 (L) 11/18/2021   HCT 34.1 (L) 11/18/2021   MCV 92.2 11/18/2021   PLT 169 11/18/2021    Lab Results  Component Value Date   CREATININE 1.21 11/18/2021   BUN 16 11/18/2021   NA 135 11/18/2021   K 3.5 11/18/2021   CL 100 11/18/2021   CO2 27 11/18/2021    Lab Results  Component Value Date   ALT 8 11/18/2021   AST 12 (L) 11/18/2021   ALKPHOS 61 11/18/2021   BILITOT 1.5 (H) 11/18/2021     Microbiology: Recent Results (from the past 240 hour(s))  Blood Culture (routine x 2)     Status: None (Preliminary result)   Collection Time: 11/17/21  5:51 PM   Specimen: Peripheral; Blood  Result Value Ref Range Status   Specimen Description RIGHT ANTECUBITAL  Final   Special Requests   Final    BOTTLES DRAWN AEROBIC AND ANAEROBIC Blood Culture adequate volume Performed at [facility], [address], [place], [place] [address]    Culture PENDING  Incomplete   Report Status PENDING  Incomplete  Blood Culture (routine x 2)     Status: None (Preliminary result)   Collection Time: 11/17/21  6:04 PM   Specimen: Peripheral; Blood  Result Value Ref Range Status   Specimen Description BLOOD RIGHT ARM  Final   Special Requests   Final    BOTTLES DRAWN AEROBIC AND ANAEROBIC  Blood Culture adequate volume Performed at [facility], [address], [place], [place] [address]    Culture PENDING  Incomplete    Report Status PENDING  Incomplete  Resp Panel by RT-PCR (Flu A&B, Covid) Nasopharyngeal Swab     Status: None   Collection Time: 11/17/21  7:00 PM   Specimen: Nasopharyngeal Swab; Nasopharyngeal(NP) swabs in vial transport medium  Result Value Ref Range Status   SARS Coronavirus 2 by RT PCR NEGATIVE NEGATIVE Final    Comment: (NOTE) SARS-CoV-2 target nucleic acids are NOT DETECTED.  The SARS-CoV-2 RNA is generally detectable in upper respiratory specimens during the acute phase of infection. The lowest concentration of SARS-CoV-2 viral copies this assay can detect is 138 copies/mL. A negative result does not preclude SARS-Cov-2 infection and should not be used as the sole basis for treatment or other patient management decisions. A negative result may occur with  improper specimen collection/handling, submission of specimen other than nasopharyngeal swab, presence of viral mutation(s) within the areas targeted by this assay, and inadequate number of viral copies(<138 copies/mL). A negative result must be combined with clinical observations, patient history, and epidemiological information. The expected result is Negative.  Fact Sheet for Patients:  BloggerCourse.com  Fact Sheet for Healthcare Providers:  SeriousBroker.it  This test is no t yet approved or cleared by the [place] FDA and  has been authorized for detection and/or diagnosis of SARS-CoV-2 by FDA under an Emergency Use Authorization (EUA). This EUA will remain  in effect (meaning this test can be used) for the duration of the COVID-19 declaration under Section 564(b)(1) of the Act, 21 U.S.C.section 360bbb-3(b)(1), unless the authorization is terminated  or revoked sooner.       Influenza A by PCR NEGATIVE NEGATIVE Final   Influenza B by PCR NEGATIVE NEGATIVE Final    Comment: (NOTE) The Xpert Xpress SARS-CoV-2/FLU/RSV plus assay is intended as an aid in the  diagnosis of influenza from Nasopharyngeal swab specimens and should not be used as a sole basis for treatment. Nasal washings and aspirates are unacceptable for Xpert Xpress SARS-CoV-2/FLU/RSV testing.  Fact Sheet for Patients: BloggerCourse.com  Fact Sheet for Healthcare Providers: SeriousBroker.it  This test is not yet approved or cleared by the [place] FDA and has been authorized for detection and/or diagnosis of SARS-CoV-2 by FDA under an Emergency Use Authorization (EUA). This EUA will remain in effect (meaning this test can be used) for the duration of the COVID-19 declaration under Section 564(b)(1) of the Act, 21 U.S.C. section 360bbb-3(b)(1), unless the authorization is terminated or revoked.  Performed at [facility], [address], [place], [place] [address]   MRSA Next Gen by PCR, Nasal     Status: None   Collection Time: 11/18/21  1:15 AM   Specimen: Nasal Mucosa; Nasal Swab  Result Value Ref Range Status   MRSA by PCR Next Gen NOT DETECTED NOT DETECTED Final    Comment: (NOTE) The GeneXpert MRSA Assay (FDA approved for NASAL specimens only), is one component of a comprehensive MRSA colonization surveillance program. It is not intended to diagnose MRSA infection nor to guide or monitor treatment for MRSA infections. Test performance is not FDA approved in patients less than 55 years old. Performed at [facility], [address], [place], [place] [address]   CSF culture w Stat Gram Stain     Status: None (Preliminary result)   Collection Time: 11/18/21 11:06 AM   Specimen: CSF; Cerebrospinal Fluid  Result Value Ref Range  Status   Specimen Description   Final    CSF Performed at [facility], [address], [place], [place] [address]    Special Requests   Final    NONE Performed at [facility], [address] [place], [place] [address]    Gram Stain   Final    NO ORGANISMS SEEN WBC PRESENT,BOTH PMN AND  MONONUCLEAR   Culture PENDING  Incomplete   Report Status PENDING  Incomplete  Anaerobic culture w Gram Stain     Status: None (Preliminary result)   Collection Time: 11/18/21 11:06 AM  Result Value Ref Range Status   Specimen Description PENDING  Incomplete   Special Requests PENDING  Incomplete   Gram Stain   Final    RARE WBC PRESENT, PREDOMINANTLY MONONUCLEAR NO ORGANISMS SEEN Performed at [facility], [address] [place], [place] [address]    Culture PENDING  Incomplete   Report Status PENDING  Incomplete    Acey Lav, MD [facility] [phone] pager  11/18/2021, 6:39 PM

## 2021-11-18 NOTE — Consult Note (Signed)
? ?NAME:  Khelan Slemmer, MRN:  [id number], DOB:  03-07-1976, LOS: 1 ?ADMISSION DATE:  11/17/2021, CONSULTATION DATE:  11/18/21  ?REFERRING MD:  Wynetta Emery, [facility]  CHIEF COMPLAINT:  fever/ams  ? ?History of Present Illness:  ?69 yobm quit smoking 20 y PTA with medical history significant of hypertension, hyperlipidemia and obstructive sleep apnea eval by oncology 10/22/21 with progressive weakness and numbness x one year assoc with adenopathy s/p Ax node bx 3/13 pos for NCG with neg flow cytometry for lymphoma then presented to the emergency department via EMS due to fever and altered mental status.  Patient was lethargic, though easily arousable with mild sternal rub, but he only opens his eyes, looks around and quickly goes back to sleep, he was unable to provide a history.  History was obtained from ED physician and ED medical record.  Per report, patient has poor communication Abilities at baseline.  EMS was activated at home, on arrival of EMS, patient was noted to have a fever of 104F axillary.  Patient has generalized lymphadenopathy and had a biopsy on 3/13 to rule out sarcoidosis/lymphoma.  The cause of the fever was unknown, IV hydration of 500 mL of LR was given en route to the ED.   ?  ?ED course: ?In the emergency department,Patient was febrile, tachypneic, tachycardic, BP was 191/100.  Work-up in the ED showed normocytic anemia, BUNs/creatinine 14/1.33 (baseline creatinine at 0.9-1.1), ABG showed hypoxia, lactic acid 2.1 > 4.6 > 4.2 > 1.4, urinalysis was positive for proteinuria and hematuria, but was unimpressive for UTI, procalcitonin 0.30, urine drug screen was positive for opiates and benzodiazepines. 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 ?Chest x-ray showed no definite pneumonia ?Patient was treated with  IV vancomycin and cefepime, Tylenol  was given and cooling blanket was placed due to fever.  IV labetalol 20 mg x 1 was given due to high blood pressure, patient was provided with IV LR per sepsis protocol, Ativan and Flagyl were given as well.  Patient was also treated with Zovirax to cover for possible viral meningitis.  ?Patient was seen in the ED by Dr. Constance Haw and on examination of the incision, it does not appear to be the cause of patient's septic state, there were no rashes or signs of cellulitis on the body per medical record. ? Hospitalist was asked to admit patient for further evaluation and management and PCCM consulted am 3/15  ? ? ?Pertinent  Medical History  ?? Asthma ?Hyperlipidemia  ?HBP ?Neuropathy ? ?Significant Hospital Events: ?Including procedures, antibiotic start and stop dates in addition to other pertinent events   ?PCT 3/14  0.30 ?MRI 3/15 1. No acute infarct. ?2. Extensive cerebral white matter disease, nonspecific. ?Considerations include markedly age advanced chronic small vessel ?ischemic, infectious and inflammatory/granulomatous processes ?including sarcoidosis, demyelinating disease, vasculitis, and ?toxic/metabolic insults.3. Known widespread bone lesions. ?LP 3/15 >>> wbc 18 with P > Lymphs, glucose 58 / serum 103 cultures >>> ?Neuro eval 3/15 ? Neurosarcoid leading possiblity ?ACE  3/15  >>>  ? ? ?Scheduled Meds: ? Chlorhexidine Gluconate Cloth  6 each Topical Daily  ? [START ON 11/19/2021] enoxaparin (LOVENOX) injection  60 mg Subcutaneous Q24H  ? ketorolac  15 mg Intravenous Once  ? ?Continuous Infusions: ? lactated ringers 125 mL/hr at 11/18/21 [id number]  ? vancomycin 2,000 mg (11/18/21 1629)  ? ?PRN Meds:.acetaminophen, labetalol  ? ? ?  Interim History / Subjective:  ?Says he thinks he was having rigors for months with sweats but never took his temp ? ?Objective   ?Blood pressure (!) 151/72, pulse 80, temperature 100.2 ?F (37.9 ?C), temperature source Bladder, resp. rate (!) 26,  height 5\' 8"  (1.727 m), weight 127.5 kg, SpO2 100 %. ?   ?FiO2 (%):  [28 %] 28 %  ? ?Intake/Output Summary (Last 24 hours) at 11/18/2021 1653 ?Last data filed at 11/18/2021 1541 ?Gross per 24 hour  ?Intake 4173.64 ml  ?Output 1800 ml  ?Net 2373.64 ml  ? ?Filed Weights  ? 11/17/21 1846  ?Weight: 127.5 kg  ? ? ?Examination: ?Tmax 104.4 > afebrile this am  ?General appearance:    obese wm nad   ?At Rest 02 sats  100% on 2lpm   ?No jvd ?Oropharynx clear,  mucosa nl ?Neck supple ?Lungs with a few scattered exp > insp rhonchi bilaterally ?RRR no s3 or or sign murmur ?Abd tensely obese with limited excursion  ?Extr warm with no edema or clubbing noted ?Neuro  Sensorium intact ,  no apparent motor deficits  ?  ? ?  ? 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  If LP neg could start prednisone 20 mg (or it's equivalent ) unless there is much more compelling reason to use higher doses as sarcoid is typically exquitely sensitive to even low doses of Prednisone and no higher dose preferred until we are comfortable this is not infection.  ? ?? Should we get rpr or HIV ?  ? ?Agree with neuro no urgency to starting steroids here. ? ?Best Practice (right click and "Reselect all SmartList Selections" daily)  ? ?Per [facility]  ? ?Labs   ?CBC: ?Recent Labs  ?Lab 11/17/21 ?1751 11/18/21 ?0354  ?WBC 7.8 7.6  ?NEUTROABS 5.9  --   ?HGB 12.5* 10.6*  ?HCT 38.4* 34.1*  ?MCV 91.6 92.2  ?PLT 225 169  ? ? ?Basic Metabolic Panel: ?Recent Labs  ?Lab 11/17/21 ?1751 11/18/21 ?0354  ?NA 135 135  ?K 3.8 3.5  ?CL 98 100  ?CO2 27 27   ?GLUCOSE 100* 103*  ?BUN 14 16  ?CREATININE 1.33* 1.21  ?CALCIUM 8.5* 7.8*  ?MG  --  1.5*  ?PHOS  --  4.6  ? ?GFR: ?Estimated Creatinine Clearance: 97.2 mL/min (by C-G formula based on SCr of 1.21 mg/dL). ?Recent Labs  ?Lab 11/17/21 ?1751 11/17/21 ?1938 11/17/21 ?2202 11/17/21 ?2357 11/18/21 ?0354  ?PROCALCITON  --   --  0.30  --   --   ?WBC 7.8  --   --   --  7.6  ?LATICACIDVEN 2.1* 4.6* 4.2* 1.4  --   ? ? ?Liver Function Tests: ?Recent Labs  ?Lab 11/17/21 ?1751 11/18/21 ?0354  ?AST 14* 12*  ?ALT 9 8  ?ALKPHOS 76 61  ?BILITOT 1.1 1.5*  ?PROT 7.6 6.6  ?ALBUMIN 3.6 3.0*  ? ?No results for input(s): LIPASE, AMYLASE in the last 168 hours. ?No results for input(s): AMMONIA in the last 168 hours. ? ?ABG ?   ?Component Value Date/Time  ? PHART  7.4 11/17/2021 1925  ? PCO2ART 47 11/17/2021 1925  ? PO2ART 69 (L) 11/17/2021 1925  ? HCO3 28.5 (H) 11/17/2021 1925  ? O2SAT 93.1 11/17/2021 1925  ?  ? ?Coagulation Profile: ?Recent Labs  ?Lab 11/17/21 ?1751  ?INR 1.1  ? ? ?Cardiac Enzymes: ?No results for input(s): CKTOTAL, CKMB, CKMBINDEX, TROPONINI in the last 168 hours. ? ?HbA1C: ?No results found for: HGBA1C ? ?CBG: ?Recent Labs  ?Lab 11/18/21 ?0206  ?GLUCAP 103*  ? ?  ? ?Past Medical History:  ?He,  has a past medical history of Hypertension and Sleep apnea.  ? ?Surgical History:  ? ?Past Surgical History:  ?Procedure Laterality Date  ? AXILLARY LYMPH NODE BIOPSY Right 11/16/2021  ? Procedure: AXILLARY LYMPH NODE BIOPSY;  Surgeon: Aviva Signs, MD;  Location: [facility];  Service: General;  Laterality: Right;  ?  ? ?Social History:  ? reports that he has quit smoking. He has never used smokeless tobacco. He reports that he does not currently use alcohol. He reports that he does not use drugs.  ? ?Family History:  ?His family history is not on file.  ? ?Allergies ?No Known Allergies  ? ?Home Medications  ?Prior to Admission medications   ?Medication Sig Start Date End Date Taking? Authorizing Provider  ?albuterol (VENTOLIN HFA)  108 (90 Base) MCG/ACT inhaler Inhale 1-2 puffs into the lungs every 6 (six) hours as needed for wheezing or shortness of breath.   Yes [provider]  ?azithromycin (ZITHROMAX) 250 MG tablet Take 1 ta

## 2021-11-18 NOTE — Assessment & Plan Note (Addendum)
RESOLVED at this time.    ?

## 2021-11-18 NOTE — Assessment & Plan Note (Addendum)
RESOLVED with supportive measures.  ?He is back to his baseline mentation.  ?

## 2021-11-18 NOTE — Progress Notes (Signed)
Date and time results received: 11/18/21 1303 ?(use smartphrase ".now" to insert current time) ? ?Test: Spinal Fluid ?Critical Value: WBC count 18 ? ?Name of Provider Notified: Dr Laural Benes and Dr Sherene Sires ? ?Orders Received? Or Actions Taken?:  No new orders currently ?

## 2021-11-18 NOTE — Progress Notes (Signed)
Final pathology of lymph node biopsy reveals granulomatous lymphadenitis.  The biopsy was negative for lymphoma.  Differential diagnosis includes sarcoidosis.  AFB and Gram stain studies still pending on the tissue.  Dr. Wynetta Emery made aware of this. ?

## 2021-11-18 NOTE — Assessment & Plan Note (Addendum)
Patient had a biopsy on 3/13 with findings of granulomatous lymphadenitis. No malignant cells seen on pathology.  AFB and fungal stains still outstanding.  ?

## 2021-11-18 NOTE — Progress Notes (Signed)
EEG complete - results pending 

## 2021-11-18 NOTE — Assessment & Plan Note (Addendum)
Resume home meds ?

## 2021-11-18 NOTE — Assessment & Plan Note (Addendum)
Resumed all  Home BP meds.  ?

## 2021-11-18 NOTE — Procedures (Signed)
Patient Name: James Hartman  ?MRN: [id number]  ?Epilepsy Attending: Lora Havens  ?Referring Physician/Provider: Bernadette Hoit, DO ?Date: 11/18/2021 ?Duration: 26.14 mins ? ?Patient history: 46 year old male with 1 year history of worsening bilateral lower extremity weakness, paresthesias, bowel and bladder incontinence and diffuse lymphadenopathy who was admitted with fever, altered mental status. EEG to evaluate for seizure ? ?Level of alertness: Awake, asleep ? ?AEDs during EEG study: None ? ?Technical aspects: This EEG study was done with scalp electrodes positioned according to the 10-20 International system of electrode placement. Electrical activity was acquired at a sampling rate of 500Hz  and reviewed with a high frequency filter of 70Hz  and a low frequency filter of 1Hz . EEG data were recorded continuously and digitally stored.  ? ?Description: The posterior dominant rhythm consists of 8 Hz activity of moderate voltage (25-35 uV) seen predominantly in posterior head regions, symmetric and reactive to eye opening and eye closing. Sleep was characterized by vertex waves, sleep spindles (12 to 14 Hz), maximal frontocentral region.  EEG showed intermittent generalized 3 to 6 Hz theta-delta slowing. Hyperventilation and photic stimulation were not performed.    ? ?ABNORMALITY ?- Intermittent slow, generalized ? ?IMPRESSION: ?This study is suggestive of mild diffuse encephalopathy, nonspecific etiology. No seizures or epileptiform discharges were seen throughout the recording. ? ?Lora Havens  ? ?

## 2021-11-18 NOTE — Procedures (Signed)
Preprocedure Dx: Fever  ?Postprocedure Dx: Fever ?Procedure:  Fluoroscopically guided lumbar puncture ?Radiologist:  Tyron Russell ?Anesthesia:  3 ml of 1% lidocaine ?Specimen:  9 ml CSF, clear colorless ?EBL:   < 1 ml ?Complications:  None  ?

## 2021-11-18 NOTE — Progress Notes (Signed)
?  Transition of Care (TOC) Screening Note ? ? ?Patient Details  ?Name: James Hartman ?Date of Birth: Jun 10, 1976 ? ? ?Transition of Care (TOC) CM/SW Contact:    ?Leitha Bleak, RN ?Phone Number: ?11/18/2021, 4:18 PM ? ? ? ?Transition of Care Department [facility]) has reviewed patient and no TOC needs have been identified at this time. We will continue to monitor patient advancement through interdisciplinary progression rounds. If new patient transition needs arise, please place a TOC consult. ? ? ?

## 2021-11-18 NOTE — Assessment & Plan Note (Addendum)
RESOLVED NOW.   ?Appreciate neurology consult and ID consult and recommendations.  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   ? ?ID ordered large volume LP for further testing.  He had done on 3/17.  He will follow up with Dr. Daiva Eves in April to discuss results.  ? ?CSF should be sent for MTB by PCR ?CSF histoplasma antigen ?CSF Blastomyces antigen ?CSF VDRL ??8-10 ml for dedicated AFB stain and culture ?"" For dedicated fungal stain and culture with cultures incubating for at least 6 weeks.  ?

## 2021-11-18 NOTE — Assessment & Plan Note (Signed)
Lactic acid 2.1 > 4.6 > 4.2 > 1.4-resolved ? ?

## 2021-11-18 NOTE — Progress Notes (Signed)
11/18/2021 ?9:11 AM ? ?Lumbar puncture is considered a medically necessary procedure understanding that patient is not able to give consent at this time. He remains altered mentation with persistent high fevers and no other clear causes have been found to explain symptoms.   CT head has been unrevealing.  MRI brain with no acute infarct seen but extensive white matter disease.   ? ?C. Laural Benes MD  ?How to contact the [facility] Attending or Consulting provider 7A - 7P or covering provider during after hours 7P -7A, for this patient?  ?Check the care team in [facility] and look for a) attending/consulting TRH provider listed and b) the [facility] team listed ?Log into [email or web address] and use [facility]'s universal password to access. If you do not have the password, please contact the hospital operator. ?Locate the [facility] provider you are looking for under [facility] and page to a number that you can be directly reached. ?If you still have difficulty reaching the provider, please page the [facility] (Director on Call) for the Hospitalists listed on amion for assistance. ? ?

## 2021-11-19 DIAGNOSIS — G0490 Encephalitis and encephalomyelitis, unspecified: Secondary | ICD-10-CM | POA: Diagnosis present

## 2021-11-19 DIAGNOSIS — G4733 Obstructive sleep apnea (adult) (pediatric): Secondary | ICD-10-CM

## 2021-11-19 DIAGNOSIS — J4540 Moderate persistent asthma, uncomplicated: Secondary | ICD-10-CM | POA: Diagnosis present

## 2021-11-19 DIAGNOSIS — K5909 Other constipation: Secondary | ICD-10-CM

## 2021-11-19 LAB — CBC WITH DIFFERENTIAL/PLATELET
Abs Immature Granulocytes: 0.01 10*3/uL (ref 0.00–0.07)
Basophils Absolute: 0 10*3/uL (ref 0.0–0.1)
Basophils Relative: 0 %
Eosinophils Absolute: 0.1 10*3/uL (ref 0.0–0.5)
Eosinophils Relative: 2 %
HCT: 33 % — ABNORMAL LOW (ref 39.0–52.0)
Hemoglobin: 10.8 g/dL — ABNORMAL LOW (ref 13.0–17.0)
Immature Granulocytes: 0 %
Lymphocytes Relative: 9 %
Lymphs Abs: 0.5 10*3/uL — ABNORMAL LOW (ref 0.7–4.0)
MCH: 30.1 pg (ref 26.0–34.0)
MCHC: 32.7 g/dL (ref 30.0–36.0)
MCV: 91.9 fL (ref 80.0–100.0)
Monocytes Absolute: 0.4 10*3/uL (ref 0.1–1.0)
Monocytes Relative: 8 %
Neutro Abs: 4.2 10*3/uL (ref 1.7–7.7)
Neutrophils Relative %: 81 %
Platelets: 159 10*3/uL (ref 150–400)
RBC: 3.59 MIL/uL — ABNORMAL LOW (ref 4.22–5.81)
RDW: 13.4 % (ref 11.5–15.5)
WBC: 5.2 10*3/uL (ref 4.0–10.5)
nRBC: 0 % (ref 0.0–0.2)

## 2021-11-19 LAB — BASIC METABOLIC PANEL
Anion gap: 8 (ref 5–15)
BUN: 16 mg/dL (ref 6–20)
CO2: 27 mmol/L (ref 22–32)
Calcium: 7.9 mg/dL — ABNORMAL LOW (ref 8.9–10.3)
Chloride: 101 mmol/L (ref 98–111)
Creatinine, Ser: 1.01 mg/dL (ref 0.61–1.24)
GFR, Estimated: >60 mL/min (ref >60)
Glucose, Bld: 115 mg/dL — ABNORMAL HIGH (ref 70–99)
Potassium: 4.4 mmol/L (ref 3.5–5.1)
Sodium: 136 mmol/L (ref 135–145)

## 2021-11-19 LAB — HEPATITIS A ANTIBODY, TOTAL: hep A Total Ab: NONREACTIVE

## 2021-11-19 LAB — FUNGUS CULTURE, BLOOD: Special Requests: ADEQUATE

## 2021-11-19 LAB — MAGNESIUM: Magnesium: 2.1 mg/dL (ref 1.7–2.4)

## 2021-11-19 LAB — CRYPTOCOCCAL ANTIGEN: Crypto Ag: NEGATIVE

## 2021-11-19 LAB — URINE CULTURE: Culture: NO GROWTH

## 2021-11-19 LAB — RPR: RPR Ser Ql: NONREACTIVE

## 2021-11-19 LAB — HEPATITIS B SURFACE ANTIGEN: Hepatitis B Surface Ag: NONREACTIVE

## 2021-11-19 LAB — PATHOLOGIST SMEAR REVIEW

## 2021-11-19 LAB — HEPATITIS C ANTIBODY: HCV Ab: NONREACTIVE

## 2021-11-19 LAB — SEDIMENTATION RATE: Sed Rate: 55 mm/hr — ABNORMAL HIGH (ref 0–16)

## 2021-11-19 MED ORDER — PREDNISONE 10 MG PO TABS
10.0000 mg | ORAL_TABLET | Freq: Every day | ORAL | Status: DC
  Administered 2021-11-19 – 2021-11-21 (×3): 10 mg via ORAL
  Filled 2021-11-19 (×3): qty 1

## 2021-11-19 MED ORDER — FLUTICASONE PROPIONATE 50 MCG/ACT NA SUSP
1.0000 | Freq: Every day | NASAL | Status: DC | PRN
  Filled 2021-11-19: qty 16

## 2021-11-19 MED ORDER — SENNOSIDES-DOCUSATE SODIUM 8.6-50 MG PO TABS
2.0000 | ORAL_TABLET | Freq: Every day | ORAL | Status: DC
  Administered 2021-11-19 – 2021-11-20 (×2): 2 via ORAL
  Filled 2021-11-19 (×2): qty 2

## 2021-11-19 MED ORDER — POLYETHYLENE GLYCOL 3350 17 G PO PACK
17.0000 g | PACK | Freq: Every day | ORAL | Status: DC
  Filled 2021-11-19: qty 1

## 2021-11-19 MED ORDER — ALBUTEROL SULFATE (2.5 MG/3ML) 0.083% IN NEBU: 3.0000 mL | INHALATION_SOLUTION | Freq: Four times a day (QID) | RESPIRATORY_TRACT | Status: DC | PRN

## 2021-11-19 MED ORDER — GABAPENTIN 300 MG PO CAPS: 300.0000 mg | ORAL_CAPSULE | Freq: Three times a day (TID) | ORAL | Status: DC | PRN

## 2021-11-19 MED ORDER — FOLIC ACID 1 MG PO TABS
1.0000 mg | ORAL_TABLET | Freq: Every day | ORAL | Status: DC
  Administered 2021-11-19 – 2021-11-21 (×3): 1 mg via ORAL
  Filled 2021-11-19 (×3): qty 1

## 2021-11-19 MED ORDER — IRBESARTAN 75 MG PO TABS
75.0000 mg | ORAL_TABLET | Freq: Every day | ORAL | Status: DC
Start: 2021-11-19 — End: 2021-11-21
  Administered 2021-11-19 – 2021-11-21 (×3): 75 mg via ORAL
  Filled 2021-11-19 (×3): qty 1

## 2021-11-19 MED ORDER — MELATONIN 3 MG PO TABS
6.0000 mg | ORAL_TABLET | Freq: Every day | ORAL | Status: DC
  Administered 2021-11-19 – 2021-11-20 (×2): 6 mg via ORAL
  Filled 2021-11-19 (×2): qty 2

## 2021-11-19 MED ORDER — GABAPENTIN 300 MG PO CAPS
300.0000 mg | ORAL_CAPSULE | Freq: Three times a day (TID) | ORAL | Status: DC
  Filled 2021-11-19 (×2): qty 1

## 2021-11-19 MED ORDER — POTASSIUM CHLORIDE 20 MEQ PO PACK
40.0000 meq | PACK | Freq: Two times a day (BID) | ORAL | Status: DC
  Administered 2021-11-19: 40 meq via ORAL
  Filled 2021-11-19 (×2): qty 2

## 2021-11-19 MED ORDER — FUROSEMIDE 10 MG/ML IJ SOLN
20.0000 mg | Freq: Once | INTRAMUSCULAR | Status: AC
  Administered 2021-11-19: 20 mg via INTRAVENOUS
  Filled 2021-11-19: qty 2

## 2021-11-19 MED ORDER — PRAVASTATIN SODIUM 40 MG PO TABS
40.0000 mg | ORAL_TABLET | Freq: Every day | ORAL | Status: DC
  Administered 2021-11-19 – 2021-11-20 (×2): 40 mg via ORAL
  Filled 2021-11-19 (×2): qty 1

## 2021-11-19 MED ORDER — HYDROCHLOROTHIAZIDE 12.5 MG PO TABS
12.5000 mg | ORAL_TABLET | Freq: Every day | ORAL | Status: DC
  Administered 2021-11-19 – 2021-11-21 (×3): 12.5 mg via ORAL
  Filled 2021-11-19 (×3): qty 1

## 2021-11-19 MED ORDER — DULOXETINE HCL 60 MG PO CPEP
60.0000 mg | ORAL_CAPSULE | Freq: Two times a day (BID) | ORAL | Status: DC
  Administered 2021-11-19 – 2021-11-21 (×5): 60 mg via ORAL
  Filled 2021-11-19 (×6): qty 1

## 2021-11-19 NOTE — Progress Notes (Addendum)
?PROGRESS NOTE ? ? ?James Hartman  [id number] DOB: [birth year] DOA: 11/17/2021 ?PCP: Alvina FilbertHunter, Denise, MD  ? ?Chief Complaint  ?Patient presents with  ? Fever  ? Altered Mental Status  ? ?Level of care: Stepdown ? ?Brief Admission History:  ?46 y.o. male with medical history significant of hypertension, hyperlipidemia and obstructive sleep apnea who presents to the emergency department via EMS due to fever and altered mental status.  Patient was lethargic, though easily arousable with mild sternal rub, but he only opens his eyes, looks around and quickly goes back to sleep, he was unable to provide a history.  History was obtained from ED physician and ED medical record.  Per report, patient has poor communication Abilities at baseline.  EMS was activated at home, on arrival of EMS, patient was noted to have a fever of 104F axillary.  Patient has generalized lymphadenopathy and had a biopsy on 3/13 to rule out sarcoidosis/lymphoma.  The cause of the fever was unknown, IV hydration of 500 mL of LR was given en route to the ED.   ?  ?ED course: ?In the emergency department,Patient was febrile, tachypneic, tachycardic, BP was 191/100.  Work-up in the ED shows normocytic anemia, BUNs/creatinine 14/1.33 (baseline creatinine at 0.9-1.1), ABG showed hypoxia, lactic acid 2.1 > 4.6 > 4.2 > 1.4, urinalysis was positive for proteinuria and hematuria, but was unimpressive for UTI, procalcitonin 0.30, urine drug screen was positive for opiates and benzodiazepines. 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  Chest x-ray showed no definite pneumonia.  Patient was treated with IV vancomycin and cefepime, Tylenol  was given and cooling blanket was placed due to fever.  IV labetalol 20 mg x 1 was given due to high blood pressure, patient  was provided with IV LR per sepsis protocol, Ativan and Flagyl were given as well.  Patient was also treated with Zovirax to cover for possible viral meningitis.   Patient was seen in the ED by Dr. Henreitta LeberBridges and on examination of the incision, it does not appear to be the cause of patient's septic state, there were no rashes or signs of cellulitis on the body per medical record.   Hospitalist was asked to admit patient for further evaluation and management. ? ?11/19/2021: mentation is slowly improving to baseline.  ID planning repeat LP with large volume to send fluid for other testing.   ?  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  Discussed with pulmonary, ID, neurology, no need to urgently start steroids at this time.  See consultant notes.   ? ?Neurosarcoidosis ?He has been restarted on regular prednisone 10 mg daily.  ? ?Meningoencephalitis  ?Appreciate neurology consult and ID consult and recommendations.  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   ? ?ID ordering large volume LP for further testing. ? ?CSF should be sent for MTB by PCR ?CSF histoplasma antigen ?CSF Blastomyces antigen ?CSF VDRL ? 8-10 ml for dedicated AFB stain and culture ?"" For dedicated fungal stain and culture with cultures incubating for at least 6 weeks.  ? ?Altered mental status ?Slowly improving with supportive measures.  ? ?Lactic acidosis ?Lactic acid 2.1 > 4.6 > 4.2 > 1.4-resolved ? ? ?Severe sepsis (HCC) ?Secondary to acute meningitis.  ID consult requested and appreciate recommendations.   ? ?Fever ?Cooling blanket ordered.   ? ?Obstructive sleep apnea ?It is  unknown if patient uses CPAP at home, he was noted to be hypoxic on room air ?Continue supplemental oxygen to maintain O2 sat > 92% ? ?Hyperlipidemia ?Resume home meds ? ?Essential hypertension ?Continue IV labetalol as needed for SBP > 160 ?Resume  Home BP meds now that his mentation is improving.  ? ?Positive urine drug screen ?Urine  drug screen was positive for benzodiazepine, Ativan was given in the ED, however, it was also positive for opiates, it is unknown if patient received opioid med prior to arrival to the ED  ?Continue to monitor and treat accordingly ? ?Acute respiratory failure with hypoxia (HCC) ?Continue supplemental oxygen to obtain O2 sat greater than 92% ? ?Generalized lymphadenopathy ?Patient had a biopsy on 3/13 with findings of granulomatous lymphadenitis. No malignant cells seen on pathology.  AFB and fungal stains still outstanding.  ? ?Chronic constipation ?Added laxatives.  ? ?Acute metabolic encephalopathy ?He is clinically much improved.  Continue supportive measures.  Working diagnosis of neurosarcoidosis.  ? ? ?DVT prophylaxis: enoxaparin ?Code Status: full  ?Family Communication:  ?Disposition: Status is: Inpatient ?Remains inpatient appropriate because: IV antibiotics, lumbar puncture ?  ?Consultants:  ?Neuro ?ID ?PCCM  ?Procedures:  ?LP fluroscopy 3/15 >> ?Antimicrobials:  ?See MAR ?Subjective: ?Pt much more alert and oriented today.  C/o constipation which is chronic.   ?Objective: ?Vitals:  ? 11/19/21 1203 11/19/21 1316 11/19/21 1400 11/19/21 1602  ?BP: (!) 168/101 (!) 149/70 (!) 146/80 (!) 143/57  ?Pulse:  87 82 97  ?Resp: 20 16 (!) 27 17  ?Temp:    97.6 ?F (36.4 ?C)  ?TempSrc:    Oral  ?SpO2: 100% 100% 96% 97%  ?Weight:      ?Height:      ? ? ?Intake/Output Summary (Last 24 hours) at 11/19/2021 1744 ?Last data filed at 11/19/2021 1200 ?Gross per 24 hour  ?Intake 1235.42 ml  ?Output 2125 ml  ?Net -889.58 ml  ? ?Filed Weights  ? 11/17/21 1846  ?Weight: 127.5 kg  ? ?Examination: ? ?General exam: Pt confused but arousable, oriented to person and place, Appears calm and comfortable  ?Respiratory system: Clear to auscultation. Respiratory effort normal. ?Cardiovascular system: normal S1 & S2 heard. No JVD, murmurs, rubs, gallops or clicks. No pedal edema. ?Gastrointestinal system: Abdomen is nondistended, soft and  nontender. No organomegaly or masses felt. Normal bowel sounds heard. ?Central nervous system: somnolent but arousable. No focal neurological deficits. ?Extremities: Symmetric 5 x 5 power. ?Skin: No rashes, lesions or ulcers. ?Psychiatry: Judgement and insight appear confused.  Mood & affect UTD.  ? ?Data Reviewed: I have personally reviewed following labs and imaging studies ? ?CBC: ?Recent Labs  ?Lab 11/17/21 ?1751 11/18/21 ?0354 11/19/21 ?0408  ?WBC 7.8 7.6 5.2  ?NEUTROABS 5.9  --  4.2  ?HGB 12.5* 10.6* 10.8*  ?HCT 38.4* 34.1* 33.0*  ?MCV 91.6 92.2 91.9  ?PLT 225 169 159  ? ? ?Basic Metabolic Panel: ?Recent Labs  ?Lab 11/17/21 ?1751 11/18/21 ?0354 11/19/21 ?0408  ?NA 135 135 136  ?K 3.8 3.5 4.4  ?CL 98 100 101  ?CO2 27 27 27   ?GLUCOSE 100* 103* 115*  ?BUN 14 16 16   ?CREATININE 1.33* 1.21 1.01  ?CALCIUM 8.5* 7.8* 7.9*  ?MG  --  1.5* 2.1  ?PHOS  --  4.6  --   ? ? ?CBG: ?Recent Labs  ?Lab 11/18/21 ?0206  ?GLUCAP 103*  ? ? ?Recent Results (from the past 240 hour(s))  ?Blood Culture (routine x 2)  Status: None (Preliminary result)  ? Collection Time: 11/17/21  5:51 PM  ? Specimen: Right Antecubital; Blood  ?Result Value Ref Range Status  ? Specimen Description RIGHT ANTECUBITAL  Final  ? Special Requests   Final  ?  BOTTLES DRAWN AEROBIC AND ANAEROBIC Blood Culture adequate volume  ? Culture   Final  ?  NO GROWTH 2 DAYS ?Performed at [facility], [address], [place], [place] [address] ?  ? Report Status PENDING  Incomplete  ?Blood Culture (routine x 2)     Status: None (Preliminary result)  ? Collection Time: 11/17/21  6:04 PM  ? Specimen: BLOOD RIGHT ARM  ?Result Value Ref Range Status  ? Specimen Description BLOOD RIGHT ARM  Final  ? Special Requests   Final  ?  BOTTLES DRAWN AEROBIC AND ANAEROBIC Blood Culture adequate volume  ? Culture   Final  ?  NO GROWTH 2 DAYS ?Performed at [facility], [address] [place], [place] [address] ?  ? Report Status PENDING  Incomplete  ?Resp Panel by RT-PCR (Flu A&B,  Covid) Nasopharyngeal Swab     Status: None  ? Collection Time: 11/17/21  7:00 PM  ? Specimen: Nasopharyngeal Swab; Nasopharyngeal(NP) swabs in vial transport medium  ?Result Value Ref Range Status  ? SARS [place]

## 2021-11-19 NOTE — Assessment & Plan Note (Addendum)
He has been restarted on regular prednisone 10 mg daily.  His symptoms have RESOLVED and he has returned to his baseline.   ?

## 2021-11-19 NOTE — Progress Notes (Signed)
? ? ? ? ? ?Virtual Visit via Video Note ? ?I connected with Ferdinand LangoMatthew Doubrava on 11/19/2021 at  by a video enabled telemedicine application and verified that I am speaking with the correct person using two identifiers. ? ?Location: ?Patient: James Hartman ICU Bed 4 ?Provider: Home ?  ?I discussed the limitations of evaluation and management by telemedicine and the availability of in person appointments. The patient expressed understanding and agreed to proceed. ? ? ? ? ? ? ? ? ?Subjective: ?No new complaints, "I want to go home" ? ? ?Antibiotics:  ?Anti-infectives (From admission, onward)  ? ? Start     Dose/Rate Route Frequency Ordered Stop  ? 11/18/21 1800  vancomycin (VANCOREADY) IVPB 2000 mg/400 mL  Status:  Discontinued       ? 2,000 mg ?200 mL/hr over 120 Minutes Intravenous Every 24 hours 11/17/21 1909 11/19/21 0955  ? 11/18/21 0200  ceFEPIme (MAXIPIME) 2 g in sodium chloride 0.9 % 100 mL IVPB  Status:  Discontinued       ? 2 g ?200 mL/hr over 30 Minutes Intravenous Every 8 hours 11/17/21 1909 11/18/21 1607  ? 11/17/21 2200  acyclovir (ZOVIRAX) 700 mg in dextrose 5 % 100 mL IVPB  Status:  Discontinued       ? 700 mg ?114 mL/hr over 60 Minutes Intravenous Every 8 hours 11/17/21 2114 11/18/21 1607  ? 11/17/21 1815  ceFEPIme (MAXIPIME) 2 g in sodium chloride 0.9 % 100 mL IVPB       ? 2 g ?200 mL/hr over 30 Minutes Intravenous STAT 11/17/21 1809 11/17/21 2003  ? 11/17/21 1815  vancomycin (VANCOREADY) IVPB 2000 mg/400 mL       ? 2,000 mg ?200 mL/hr over 120 Minutes Intravenous STAT 11/17/21 1809 11/17/21 2217  ? 11/17/21 1745  metroNIDAZOLE (FLAGYL) IVPB 500 mg       ? 500 mg ?100 mL/hr over 60 Minutes Intravenous  Once 11/17/21 1738 11/17/21 2011  ? ?  ? ? ?Medications: ?Scheduled Meds: ? Chlorhexidine Gluconate Cloth  6 each Topical Daily  ? DULoxetine  60 mg Oral BID  ? enoxaparin (LOVENOX) injection  60 mg Subcutaneous Q24H  ? folic acid  1 mg Oral Daily  ? gabapentin  300 mg Oral TID  ? hydrochlorothiazide  12.5  mg Oral Daily  ? irbesartan  75 mg Oral Daily  ? pravastatin  40 mg Oral q1800  ? predniSONE  10 mg Oral Q breakfast  ? ?Continuous Infusions: ? lactated ringers 125 mL/hr at 11/18/21 [id number]  ? ?PRN Meds:.acetaminophen, albuterol, fluticasone, ketorolac, labetalol ? ? ? ?Objective: ?Weight change:  ? ?Intake/Output Summary (Last 24 hours) at 11/19/2021 1558 ?Last data filed at 11/19/2021 1200 ?Gross per 24 hour  ?Intake 1235.42 ml  ?Output 2125 ml  ?Net -889.58 ml  ? ?Blood pressure (!) 146/80, pulse 82, temperature 97.6 ?F (36.4 ?C), temperature source Oral, resp. rate (!) 27, height 5\' 8"  (1.727 m), weight 127.5 kg, SpO2 96 %. ?Temp:  [97.4 ?F (36.3 ?C)-102.1 ?F (38.9 ?C)] 97.6 ?F (36.4 ?C) (03/16 1124) ?Pulse Rate:  [61-105] 82 (03/16 1400) ?Resp:  [11-32] 27 (03/16 1400) ?BP: (131-222)/(55-160) 146/80 (03/16 1400) ?SpO2:  [91 %-100 %] 96 % (03/16 1400) ? ?Physical Exam: ?Physical Exam ?Vitals reviewed.  ?Constitutional:   ?   Appearance: He is obese.  ?HENT:  ?   Head: Normocephalic and atraumatic.  ?Eyes:  ?   General:     ?   Right eye: No discharge.     ?  Left eye: No discharge.  ?   Extraocular Movements: Extraocular movements intact.  ?Cardiovascular:  ?   Rate and Rhythm: Normal rate and regular rhythm.  ?Pulmonary:  ?   Effort: Pulmonary effort is normal. No respiratory distress.  ?   Breath sounds: No wheezing.  ?Abdominal:  ?   General: There is no distension.  ?Skin: ?   General: Skin is dry.  ?   Coloration: Skin is not jaundiced or pale.  ?Neurological:  ?   Mental Status: He is alert and oriented to person, place, and time.  ?Psychiatric:     ?   Mood and Affect: Mood normal.     ?   Behavior: Behavior normal.     ?   Thought Content: Thought content normal.     ?   Judgment: Judgment normal.  ?  ? ?CBC: ? ? ? ?BMET ?Recent Labs  ?  11/18/21 ?0354 11/19/21 ?0408  ?NA 135 136  ?K 3.5 4.4  ?CL 100 101  ?CO2 27 27  ?GLUCOSE 103* 115*  ?BUN 16 16  ?CREATININE 1.21 1.01  ?CALCIUM 7.8* 7.9*  ? ? ? ?Liver  Panel ? ?Recent Labs  ?  11/17/21 ?1751 11/18/21 ?0354  ?PROT 7.6 6.6  ?ALBUMIN 3.6 3.0*  ?AST 14* 12*  ?ALT 9 8  ?ALKPHOS 76 61  ?BILITOT 1.1 1.5*  ? ? ? ? ? ?Sedimentation Rate ?Recent Labs  ?  11/19/21 ?0408  ?ESRSEDRATE 55*  ? ?C-Reactive Protein ?Recent Labs  ?  11/18/21 ?0354  ?CRP 2.7*  ? ? ?Micro Results: ?Recent Results (from the past 720 hour(s))  ?Blood Culture (routine x 2)     Status: None (Preliminary result)  ? Collection Time: 11/17/21  5:51 PM  ? Specimen: Right Antecubital; Blood  ?Result Value Ref Range Status  ? Specimen Description RIGHT ANTECUBITAL  Final  ? Special Requests   Final  ?  BOTTLES DRAWN AEROBIC AND ANAEROBIC Blood Culture adequate volume  ? Culture   Final  ?  NO GROWTH 2 DAYS ?Performed at [facility], [address] [place], [place] [address] ?  ? Report Status PENDING  Incomplete  ?Blood Culture (routine x 2)     Status: None (Preliminary result)  ? Collection Time: 11/17/21  6:04 PM  ? Specimen: BLOOD RIGHT ARM  ?Result Value Ref Range Status  ? Specimen Description BLOOD RIGHT ARM  Final  ? Special Requests   Final  ?  BOTTLES DRAWN AEROBIC AND ANAEROBIC Blood Culture adequate volume  ? Culture   Final  ?  NO GROWTH 2 DAYS ?Performed at [facility], [address], [place], [place] [address] ?  ? Report Status PENDING  Incomplete  ?Resp Panel by RT-PCR (Flu A&B, Covid) Nasopharyngeal Swab     Status: None  ? Collection Time: 11/17/21  7:00 PM  ? Specimen: Nasopharyngeal Swab; Nasopharyngeal(NP) swabs in vial transport medium  ?Result Value Ref Range Status  ? SARS Coronavirus 2 by RT PCR NEGATIVE NEGATIVE Final  ?  Comment: (NOTE) ?SARS-CoV-2 target nucleic acids are NOT DETECTED. ? ?The SARS-CoV-2 RNA is generally detectable in upper respiratory ?specimens during the acute phase of infection. The lowest ?concentration of SARS-CoV-2 viral copies this assay can detect is ?138 copies/mL. A negative result does not preclude SARS-Cov-2 ?infection and should not be used as the  sole basis for treatment or ?other patient management decisions. A negative result may occur with  ?improper specimen collection/handling, submission of specimen other ?  than nasopharyngeal swab, presence of viral mutation(s) within the ?areas targeted by this assay, and inadequate number of viral ?copies(<138 copies/mL). A negative result must be combined with ?clinical observations, patient history, and epidemiological ?information. The expected result is Negative. ? ?Fact Sheet for Patients:  ?BloggerCourse.com ? ?Fact Sheet for Healthcare Providers:  ?SeriousBroker.it ? ?This test is no t yet approved or cleared by the [place] FDA and  ?has been authorized for detection and/or diagnosis of SARS-CoV-2 by ?FDA under an Emergency Use Authorization (EUA). This EUA will remain  ?in effect (meaning this test can be used) for the duration of the ?COVID-19 declaration under Section 564(b)(1) of the Act, 21 ?U.S.C.section 360bbb-3(b)(1), unless the authorization is terminated  ?or revoked sooner.  ? ? ?  ? Influenza A by PCR NEGATIVE NEGATIVE Final  ? Influenza B by PCR NEGATIVE NEGATIVE Final  ?  Comment: (NOTE) ?The Xpert Xpress SARS-CoV-2/FLU/RSV plus assay is intended as an aid ?in the diagnosis of influenza from Nasopharyngeal swab specimens and ?should not be used as a sole basis for treatment. Nasal washings and ?aspirates are unacceptable for Xpert Xpress SARS-CoV-2/FLU/RSV ?testing. ? ?Fact Sheet for Patients: ?BloggerCourse.com ? ?Fact Sheet for Healthcare Providers: ?SeriousBroker.it ? ?This test is not yet approved or cleared by the [place] FDA and ?has been authorized for detection and/or diagnosis of SARS-CoV-2 by ?FDA under an Emergency Use Authorization (EUA). This EUA will remain ?in effect (meaning this test can be used) for the duration of the ?COVID-19 declaration under Section 564(b)(1) of the  Act, 21 U.S.C. ?section 360bbb-3(b)(1), unless the authorization is terminated or ?revoked. ? ?Performed at [facility], [address] [place], [place] [address] ?  ?Urine Culture     Status: None  ? Co

## 2021-11-19 NOTE — Assessment & Plan Note (Addendum)
Added laxatives.  ?He is having bowel movements.  ?

## 2021-11-19 NOTE — Assessment & Plan Note (Addendum)
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  ?

## 2021-11-20 ENCOUNTER — Inpatient Hospital Stay (HOSPITAL_COMMUNITY): Payer: No Typology Code available for payment source

## 2021-11-20 ENCOUNTER — Encounter (HOSPITAL_COMMUNITY): Payer: Self-pay | Admitting: Internal Medicine

## 2021-11-20 DIAGNOSIS — D8689 Sarcoidosis of other sites: Secondary | ICD-10-CM

## 2021-11-20 DIAGNOSIS — R32 Unspecified urinary incontinence: Secondary | ICD-10-CM

## 2021-11-20 DIAGNOSIS — G0490 Encephalitis and encephalomyelitis, unspecified: Secondary | ICD-10-CM

## 2021-11-20 DIAGNOSIS — M5416 Radiculopathy, lumbar region: Secondary | ICD-10-CM

## 2021-11-20 DIAGNOSIS — J4540 Moderate persistent asthma, uncomplicated: Secondary | ICD-10-CM

## 2021-11-20 LAB — BASIC METABOLIC PANEL
Anion gap: 3 — ABNORMAL LOW (ref 5–15)
BUN: 13 mg/dL (ref 6–20)
CO2: 27 mmol/L (ref 22–32)
Calcium: 8.1 mg/dL — ABNORMAL LOW (ref 8.9–10.3)
Chloride: 104 mmol/L (ref 98–111)
Creatinine, Ser: 0.83 mg/dL (ref 0.61–1.24)
GFR, Estimated: >60 mL/min (ref >60)
Glucose, Bld: 97 mg/dL (ref 70–99)
Potassium: 3.9 mmol/L (ref 3.5–5.1)
Sodium: 134 mmol/L — ABNORMAL LOW (ref 135–145)

## 2021-11-20 LAB — CBC WITH DIFFERENTIAL/PLATELET
Abs Immature Granulocytes: 0.01 10*3/uL (ref 0.00–0.07)
Basophils Absolute: 0 10*3/uL (ref 0.0–0.1)
Basophils Relative: 0 %
Eosinophils Absolute: 0.1 10*3/uL (ref 0.0–0.5)
Eosinophils Relative: 3 %
HCT: 29.1 % — ABNORMAL LOW (ref 39.0–52.0)
Hemoglobin: 9.4 g/dL — ABNORMAL LOW (ref 13.0–17.0)
Immature Granulocytes: 0 %
Lymphocytes Relative: 22 %
Lymphs Abs: 0.6 10*3/uL — ABNORMAL LOW (ref 0.7–4.0)
MCH: 29.1 pg (ref 26.0–34.0)
MCHC: 32.3 g/dL (ref 30.0–36.0)
MCV: 90.1 fL (ref 80.0–100.0)
Monocytes Absolute: 0.5 10*3/uL (ref 0.1–1.0)
Monocytes Relative: 17 %
Neutro Abs: 1.7 10*3/uL (ref 1.7–7.7)
Neutrophils Relative %: 58 %
Platelets: 138 10*3/uL — ABNORMAL LOW (ref 150–400)
RBC: 3.23 MIL/uL — ABNORMAL LOW (ref 4.22–5.81)
RDW: 13.5 % (ref 11.5–15.5)
WBC: 2.9 10*3/uL — ABNORMAL LOW (ref 4.0–10.5)
nRBC: 0 % (ref 0.0–0.2)

## 2021-11-20 LAB — HEPATITIS B SURFACE ANTIBODY, QUANTITATIVE: Hep B S AB Quant (Post): <3.1 m[IU]/mL — ABNORMAL LOW (ref >9.9)

## 2021-11-20 LAB — C-REACTIVE PROTEIN: CRP: 5.8 mg/dL — ABNORMAL HIGH (ref <1.0)

## 2021-11-20 LAB — HIV-1 RNA QUANT-NO REFLEX-BLD
HIV 1 RNA Quant: <20 copies/mL
LOG10 HIV-1 RNA: UNDETERMINED log10copy/mL

## 2021-11-20 IMAGING — RF DG SPINAL PUNCT LUMBAR DIAG WITH FL CT GUIDANCE
2 series · 2 of 2 positions shown · non-contrast
Comparison: [DATE]

CLINICAL DATA: Meningoencephalitis, fever, altered mental status

EXAM:
DIAGNOSTIC LUMBAR PUNCTURE UNDER FLUOROSCOPIC GUIDANCE

[Series 4: cp_standard · 0.18mm/px · 1 of 1 slices shown (1 of 2)]
[im 1/1]
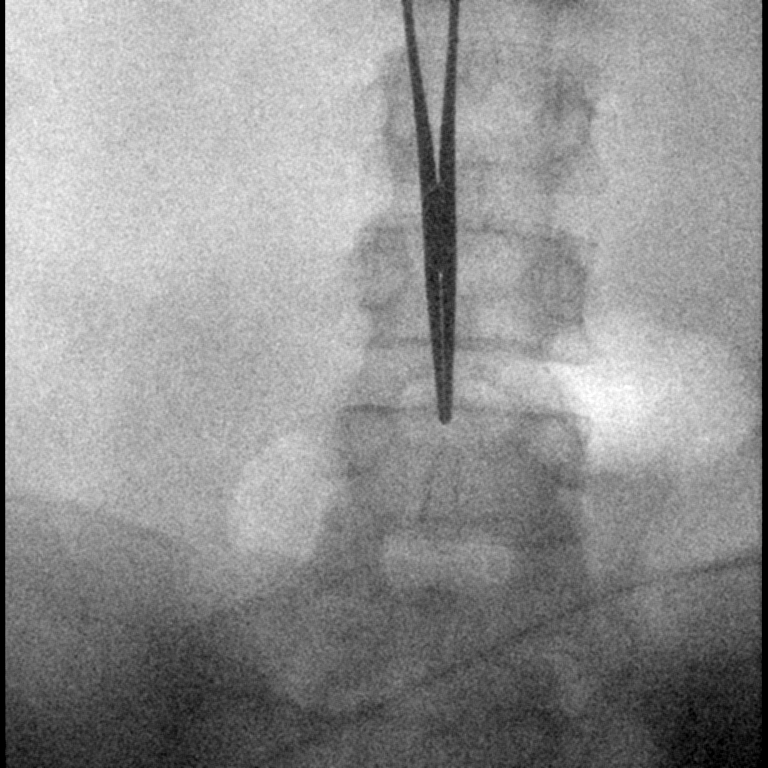

[Series 5: cp_standard · 0.18mm/px · 1 of 1 slices shown (2 of 2)]
[im 1/1]
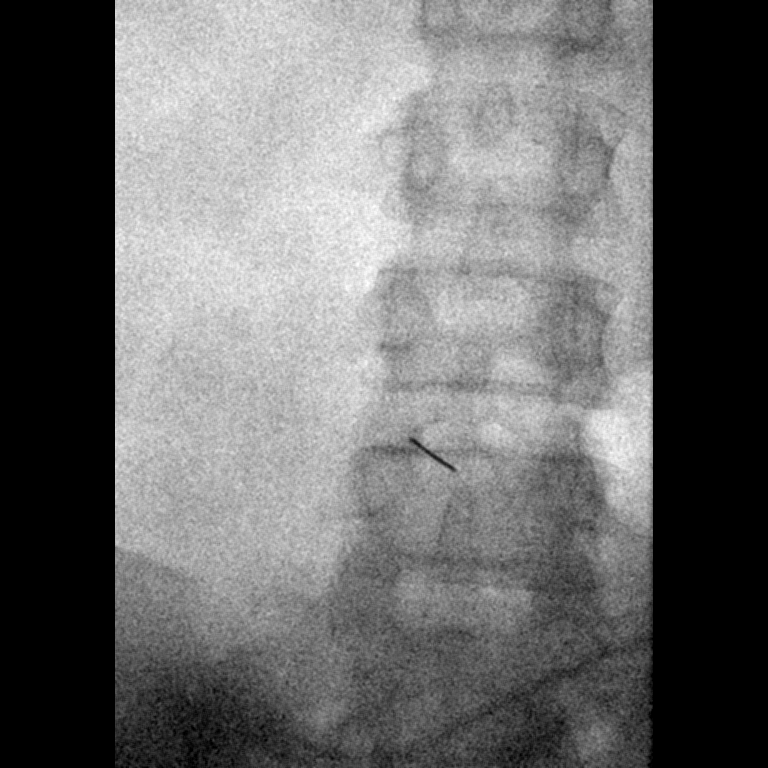

[2 of 2 positions shown; findings below may reference images not displayed]

FLUOROSCOPY:
Fluoroscopy Time:  1 minute 6 seconds

Radiation Exposure Index (if provided by the fluoroscopic device):
72.4 mGy

Number of Acquired Spot Images: 2 fluoroscopic captures

PROCEDURE:
Procedure, benefits, and risks were discussed with the patient,
including alternatives.

Patient's questions were answered.

Written informed consent was obtained.

Timeout protocol followed.

Patient placed prone.

L4-L5 disc space was localized under fluoroscopy.

Skin prepped and draped in usual sterile fashion.

Skin and soft tissues anesthetized with 3 mL of 1% lidocaine.

20 gauge needle was advanced into the spinal canal where clear
colorless CSF was encountered .

Requested 34 mL of CSF was obtained in 4 tubes for requested
analysis.

Procedure tolerated very well by patient without immediate
complication.
IMPRESSION: Large volume fluoroscopic guided lumbar puncture as above.

## 2021-11-20 NOTE — Evaluation (Signed)
Physical Therapy Evaluation ?Patient Details ?Name: James Hartman ?MRN: [id number] ?DOB: 03/22/1976 ?Today's Date: 11/20/2021 ? ?History of Present Illness ? Curtiss Mahmood is a 46 y.o. male with medical history significant of hypertension, hyperlipidemia and obstructive sleep apnea who presents to the emergency department via EMS due to fever and altered mental status.  Patient was lethargic, though easily arousable with mild sternal rub, but he only opens his eyes, looks around and quickly goes back to sleep, he was unable to provide a history.  History was obtained from ED physician and ED medical record.  Per report, patient has poor communication Abilities at baseline.  EMS was activated at home, on arrival of EMS, patient was noted to have a fever of 104F axillary.  Patient has generalized lymphadenopathy and had a biopsy on 3/13 to rule out sarcoidosis/lymphoma.  The cause of the fever was unknown, IV hydration of 500 mL of LR was given en route to the ED. ?  ?Clinical Impression ? Patient demonstrates good return for bed mobility and transferring to/from chair without AD, but demonstrates slow labored movement requiring hand held assist when walking in room mostly due to c/o swelling in legs with discomfort/pain in thighs.  Patient safer using RW with good return demonstrated for ambulation in room/hallway without loss of balance and tolerated sitting up in chair after therapy.  Patient will benefit from continued skilled physical therapy in hospital and recommended venue below to increase strength, balance, endurance for safe ADLs and gait.  ?   ? ?Recommendations for follow up therapy are one component of a multi-disciplinary discharge planning process, led by the attending physician.  Recommendations may be updated based on patient status, additional functional criteria and insurance authorization. ? ?Follow Up Recommendations Outpatient PT ? ?  ?Assistance Recommended at Discharge Set up  Supervision/Assistance  ?Patient can return home with the following ? A little help with walking and/or transfers;A little help with bathing/dressing/bathroom;Help with stairs or ramp for entrance;Assistance with cooking/housework ? ?  ?Equipment Recommendations Rolling walker (2 wheels);BSC/3in1  ?Recommendations for Other Services ?    ?  ?Functional Status Assessment Patient has had a recent decline in their functional status and demonstrates the ability to make significant improvements in function in a reasonable and predictable amount of time.  ? ?  ?Precautions / Restrictions Precautions ?Precautions: Fall ?Restrictions ?Weight Bearing Restrictions: No  ? ?  ? ?Mobility ? Bed Mobility ?Overal bed mobility: Modified Independent ?  ?  ?  ?  ?  ?  ?  ?  ? ?Transfers ?Overall transfer level: Needs assistance ?Equipment used: None, Rolling walker (2 wheels) ?Transfers: Sit to/from Stand, Bed to chair/wheelchair/BSC ?Sit to Stand: Supervision, Min guard ?  ?Step pivot transfers: Supervision, Min guard ?  ?  ?  ?General transfer comment: slow labored unsteady movement, safer using RW due to BLE swelling ?  ? ?Ambulation/Gait ?Ambulation/Gait assistance: Supervision, Min guard ?Gait Distance (Feet): 75 Feet ?Assistive device: Rolling walker (2 wheels), 1 person hand held assist ?Gait Pattern/deviations: Decreased step length - right, Decreased step length - left, Decreased stride length, Wide base of support ?Gait velocity: decreased ?  ?  ?General Gait Details: slow labored unsteady movement requiring hand held assist for safety, required use of RW with good return for ambulating in room and hallway without loss of balance demonstrating slightly labored cadence with wide base of support due to swelling in legs ? ?Stairs ?  ?  ?  ?  ?  ? ?Wheelchair  Mobility ?  ? ?Modified Rankin (Stroke Patients Only) ?  ? ?  ? ?Balance Overall balance assessment: Needs assistance ?Sitting-balance support: Feet supported, No upper  extremity supported ?Sitting balance-Leahy Scale: Good ?Sitting balance - Comments: seated at EOB ?  ?Standing balance support: During functional activity, No upper extremity supported ?Standing balance-Leahy Scale: Fair ?Standing balance comment: fair/good using RW ?  ?  ?  ?  ?  ?  ?  ?  ?  ?  ?  ?   ? ? ? ?Pertinent Vitals/Pain Pain Assessment ?Pain Assessment: Faces ?Faces Pain Scale: Hurts little more ?Pain Location: BLE mostly thighs due to swelling ?Pain Descriptors / Indicators: Pressure, Sore, Guarding ?Pain Intervention(s): Limited activity within patient's tolerance, Monitored during session, Repositioned  ? ? ?Home Living Family/patient expects to be discharged to:: Private residence ?Living Arrangements: Spouse/significant other;Children ?Available Help at Discharge: Family;Available PRN/intermittently ?Type of Home: House ?Home Access: Stairs to enter ?Entrance Stairs-Rails: None ?Entrance Stairs-Number of Steps: 2 ?  ?Home Layout: One level ?Home Equipment: None ?   ?  ?Prior Function Prior Level of Function : Independent/Modified Independent ?  ?  ?  ?  ?  ?  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  ? ? ?Hand Dominance  ?   ? ?  ?Extremity/Trunk Assessment  ? Upper Extremity Assessment ?Upper Extremity Assessment: Overall WFL for tasks assessed ?  ? ?Lower Extremity Assessment ?Lower Extremity Assessment: Generalized weakness ?  ? ?Cervical / Trunk Assessment ?Cervical / Trunk Assessment: Normal  ?Communication  ?    ?Cognition Arousal/Alertness: Awake/alert ?Behavior During Therapy: [facility] for tasks assessed/performed ?Overall Cognitive Status: Within Functional Limits for tasks assessed ?  ?  ?  ?  ?  ?  ?  ?  ?  ?  ?  ?  ?  ?  ?  ?  ?  ?  ?  ? ?  ?General Comments   ? ?  ?Exercises    ? ?Assessment/Plan  ?  ?PT Assessment Patient needs continued PT services  ?PT Problem List Decreased strength;Decreased activity tolerance;Decreased balance;Decreased mobility ? ?    ?  ?PT Treatment Interventions DME instruction;Gait training;Stair training;Functional mobility training;Therapeutic activities;Therapeutic exercise;Balance training;Patient/family education   ? ?PT Goals (Current goals can be found in the Care Plan section)  ?Acute Rehab PT Goals ?Patient Stated Goal: return home with family to assist ?PT Goal Formulation: With patient ?Time For Goal Achievement: 11/27/21 ?Potential to Achieve Goals: Good ? ?  ?Frequency Min 3X/week ?  ? ? ?Co-evaluation   ?  ?  ?  ?  ? ? ?  ?AM-PAC PT "6 Clicks" Mobility  ?Outcome Measure Help needed turning from your back to your side while in a flat bed without using bedrails?: None ?Help needed moving from lying on your back to sitting on the side of a flat bed without using bedrails?: None ?Help needed moving to and from a bed to a chair (including a wheelchair)?: A Little ?Help needed standing up from a chair using your arms (e.g., wheelchair or bedside chair)?: A Little ?Help needed to walk in hospital room?: A Little ?Help needed climbing 3-5 steps with a railing? : A Little ?6 Click Score: 20 ? ?  ?End of Session   ?Activity Tolerance: Patient tolerated treatment well;Patient limited by fatigue ?Patient left: in chair;with call bell/phone within reach ?Nurse Communication: Mobility status ?PT Visit Diagnosis: Unsteadiness on feet (R26.81);Other abnormalities of gait and mobility (R26.89);Muscle weakness (  generalized) (M62.81) ?  ? ?Time: 4174-0814 ?PT Time Calculation (min) (ACUTE ONLY): 23 min ? ? ?Charges:   PT Evaluation ?$PT Eval Moderate Complexity: 1 Mod ?PT Treatments ?$Therapeutic Activity: 23-37 mins ?  ?   ? ? ?10:47 AM, 11/20/21 ?Ocie Bob, MPT ?Physical Therapist with [place] ?[facility] ?[phone] office ?[phone] mobile phone ? ? ?

## 2021-11-20 NOTE — Progress Notes (Signed)
?PROGRESS NOTE ? ? ?James Hartman  XTK:[id number] DOB: [birth year] DOA: 11/17/2021 ?PCP: Alvina Filbert, MD  ? ?Chief Complaint  ?Patient presents with  ? Fever  ? Altered Mental Status  ? ?Level of care: Med-Surg ? ?Brief Admission History:  ?46 y.o. male with medical history significant of hypertension, hyperlipidemia and obstructive sleep apnea who presents to the emergency department via EMS due to fever and altered mental status.  Patient was lethargic, though easily arousable with mild sternal rub, but he only opens his eyes, looks around and quickly goes back to sleep, he was unable to provide a history.  History was obtained from ED physician and ED medical record.  Per report, patient has poor communication Abilities at baseline.  EMS was activated at home, on arrival of EMS, patient was noted to have a fever of 104F axillary.  Patient has generalized lymphadenopathy and had a biopsy on 3/13 to rule out sarcoidosis/lymphoma.  The cause of the fever was unknown, IV hydration of 500 mL of LR was given en route to the ED.   ?  ?ED course: ?In the emergency department,Patient was febrile, tachypneic, tachycardic, BP was 191/100.  Work-up in the ED shows normocytic anemia, BUNs/creatinine 14/1.33 (baseline creatinine at 0.9-1.1), ABG showed hypoxia, lactic acid 2.1 > 4.6 > 4.2 > 1.4, urinalysis was positive for proteinuria and hematuria, but was unimpressive for UTI, procalcitonin 0.30, urine drug screen was positive for opiates and benzodiazepines. 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  Chest x-ray showed no definite pneumonia.  Patient was treated with IV vancomycin and cefepime, Tylenol  was given and cooling blanket was placed due to fever.  IV labetalol 20 mg x 1 was given due to high blood pressure, patient  was provided with IV LR per sepsis protocol, Ativan and Flagyl were given as well.  Patient was also treated with Zovirax to cover for possible viral meningitis.   Patient was seen in the ED by Dr. Henreitta Leber and on examination of the incision, it does not appear to be the cause of patient's septic state, there were no rashes or signs of cellulitis on the body per medical record.   Hospitalist was asked to admit patient for further evaluation and management. ? ?11/19/2021: mentation is slowly improving to baseline.  ID planning repeat LP with large volume to send fluid for other testing.   ? ?11/20/2021: Large volume LP completed, additional testing sent per ID recommendations.  Pt's mentation continues to improve.   ?  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  Discussed with pulmonary, ID, neurology, no need to urgently start steroids at this time.  See consultant notes.   ? ?Meningoencephalitis ?He continues to improve daily.  ?ID sent additional CSF for further testing as noted.   ? ?Neurosarcoidosis ?He has been restarted on regular prednisone 10 mg daily.  ? ?Meningoencephalitis  ?Appreciate neurology consult and ID consult and recommendations.  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   ? ?ID ordering large volume LP for further testing. ? ?CSF should be sent for MTB by PCR ?CSF histoplasma antigen ?CSF Blastomyces antigen ?CSF VDRL ? 8-10 ml for dedicated AFB stain and culture ?"" For dedicated fungal stain and culture with cultures incubating for at least 6 weeks.  ? ?Altered mental status ?Slowly improving with supportive measures.  ? ?Lactic acidosis ?Lactic  acid 2.1 > 4.6 > 4.2 > 1.4-resolved ? ? ?Severe sepsis (HCC) ?Sepsis physiology has resolved now.  No evidence of bacterial infection has been found.   ? ?Fever ?defervesced at this time.    ? ?Obstructive sleep apnea ?It is unknown if patient uses CPAP at home, he was noted to be hypoxic on room air ?Continue  supplemental oxygen to maintain O2 sat > 92% ? ?Hyperlipidemia ?Resume home meds ? ?Essential hypertension ?Continue IV labetalol as needed for SBP > 160 ?Resume  Home BP meds now that his mentation is improving.  ? ?Positive urine drug screen ?Urine drug screen was positive for benzodiazepine, Ativan was given in the ED, however, it was also positive for opiates, it is unknown if patient received opioid med prior to arrival to the ED  ?Continue to monitor and treat accordingly ? ?Acute respiratory failure with hypoxia (HCC) ?Continue supplemental oxygen to obtain O2 sat greater than 92% ? ?Generalized lymphadenopathy ?Patient had a biopsy on 3/13 with findings of granulomatous lymphadenitis. No malignant cells seen on pathology.  AFB and fungal stains still outstanding.  ? ?Chronic constipation ?Added laxatives.  ?He is having bowel movements.  ? ?Acute metabolic encephalopathy ?He is clinically much improved.  Continue supportive measures.  Working diagnosis of neurosarcoidosis.  ? ? ?DVT prophylaxis: enoxaparin ?Code Status: full  ?Family Communication:  ?Disposition: Status is: Inpatient ?Remains inpatient appropriate because: IV antibiotics, lumbar puncture ?  ?Consultants:  ?Neuro ?ID ?PCCM  ?Procedures:  ?LP fluroscopy 3/15 >> ?Antimicrobials:  ?See MAR ?Subjective: ?Pt denies headache, eating and drinking well.  He had a bowel movement this morning. Marland Kitchen.   ?Objective: ?Vitals:  ? 11/20/21 0900 11/20/21 1000 11/20/21 1100 11/20/21 1126  ?BP:      ?Pulse: (!) 118 91 96   ?Resp: (!) 30 (!) 25 (!) 22   ?Temp:    (!) 97.5 ?F (36.4 ?C)  ?TempSrc:    Oral  ?SpO2: 99% 97% 99%   ?Weight:      ?Height:      ? ? ?Intake/Output Summary (Last 24 hours) at 11/20/2021 1532 ?Last data filed at 11/20/2021 1257 ?Gross per 24 hour  ?Intake 1858 ml  ?Output 641 ml  ?Net 1217 ml  ? ?Filed Weights  ? 11/17/21 1846 11/20/21 0500  ?Weight: 127.5 kg 128 kg  ? ?Examination: ? ?General exam: Pt awake, alert oriented x3, Appears calm and  comfortable  ?Respiratory system: Clear to auscultation. Respiratory effort normal. ?Cardiovascular system: normal S1 & S2 heard. No JVD, murmurs, rubs, gallops or clicks. No pedal edema. ?Gastrointestinal system: Abdomen is nondistended, soft and nontender. No organomegaly or masses felt. Normal bowel sounds heard. ?Central nervous system: somnolent but arousable. No focal neurological deficits. ?Extremities: Symmetric 5 x 5 power. ?Skin: No rashes, lesions or ulcers. ?Psychiatry: Judgement and insight appear normal.  Mood & affect seem normal.  ? ?Data Reviewed: I have personally reviewed following labs and imaging studies ? ?CBC: ?Recent Labs  ?Lab 11/17/21 ?1751 11/18/21 ?0354 11/19/21 ?0408 11/20/21 ?0426  ?WBC 7.8 7.6 5.2 2.9*  ?NEUTROABS 5.9  --  4.2 1.7  ?HGB 12.5* 10.6* 10.8* 9.4*  ?HCT 38.4* 34.1* 33.0* 29.1*  ?MCV 91.6 92.2 91.9 90.1  ?PLT 225 169 159 138*  ? ? ?Basic Metabolic Panel: ?Recent Labs  ?Lab 11/17/21 ?1751 11/18/21 ?0354 11/19/21 ?0408 11/20/21 ?0426  ?NA 135 135 136 134*  ?K 3.8 3.5 4.4 3.9  ?CL 98 100 101 104  ?CO2 27 27 27 27   ?  GLUCOSE 100* 103* 115* 97  ?BUN 14 16 16 13   ?CREATININE 1.33* 1.21 1.01 0.83  ?CALCIUM 8.5* 7.8* 7.9* 8.1*  ?MG  --  1.5* 2.1  --   ?PHOS  --  4.6  --   --   ? ? ?CBG: ?Recent Labs  ?Lab 11/18/21 ?0206  ?GLUCAP 103*  ? ? ?Recent Results (from the past 240 hour(s))  ?Blood Culture (routine x 2)     Status: None (Preliminary result)  ? Collection Time: 11/17/21  5:51 PM  ? Specimen: Right Antecubital; Blood  ?Result Value Ref Range Status  ? Specimen Description RIGHT ANTECUBITAL  Final  ? Special Requests   Final  ?  BOTTLES DRAWN AEROBIC AND ANAEROBIC Blood Culture adequate volume  ? Culture   Final  ?  NO GROWTH 3 DAYS ?Performed at [facility], [address], [place], [place] ?  ? Report Status PENDING  Incomplete  ?Blood Culture (routine x 2)     Status: None (Preliminary result)  ? Collection Time: 11/17/21  6:04 PM  ? Specimen: BLOOD RIGHT ARM   ?Result Value Ref Range Status  ? Specimen Description BLOOD RIGHT ARM  Final  ? Special Requests   Final  ?  BOTTLES DRAWN AEROBIC AND ANAEROBIC Blood Culture adequate volume  ? Culture   Final  ?  NO GROWTH 3 DAYS ?

## 2021-11-20 NOTE — Assessment & Plan Note (Addendum)
RESOLVED NOW with supportive measures.  ?

## 2021-11-20 NOTE — Progress Notes (Signed)
Patient tolerated lumbar puncture procedure well today and 34 mL of clear colorless fluid removed and sent to lab by this nurse at 1234. PT verbalized understanding of post procedure instructions and transported laying flat on hospital bed back to inpatient bed assignment at this time with no acute distress noted.  ?

## 2021-11-20 NOTE — Procedures (Signed)
Preprocedure Dx: fever ?Postprocedure Dx: fever ?Procedure:  Fluoroscopically guided lumbar puncture ?Radiologist:  Tyron Russell ?Anesthesia:  3 ml of 1% lidocaine ?Specimen:  34 ml CSF, clear colorless ?EBL:   < 1 ml ?Complications:  None  ?

## 2021-11-20 NOTE — Plan of Care (Signed)
?  Problem: Acute Rehab PT Goals(only PT should resolve) ?Goal: Pt Will Go Supine/Side To Sit ?Outcome: Progressing ?Flowsheets (Taken 11/20/2021 1048) ?Pt will go Supine/Side to Sit: ? Independently ? with modified independence ?Goal: Patient Will Transfer Sit To/From Stand ?Outcome: Progressing ?Flowsheets (Taken 11/20/2021 1048) ?Patient will transfer sit to/from stand: ? Independently ? with modified independence ?Goal: Pt Will Transfer Bed To Chair/Chair To Bed ?Outcome: Progressing ?Flowsheets (Taken 11/20/2021 1048) ?Pt will Transfer Bed to Chair/Chair to Bed: with modified independence ?Goal: Pt Will Ambulate ?Outcome: Progressing ?Flowsheets (Taken 11/20/2021 1048) ?Pt will Ambulate: ? > 125 feet ? with modified independence ? with rolling walker ?  ?10:49 AM, 11/20/21 ?Ocie Bob, MPT ?Physical Therapist with [place] ?[facility] ?[phone] office ?[phone] mobile phone ? ?

## 2021-11-20 NOTE — Progress Notes (Signed)
Report on patient given to nurse Whitney on dept 300, patient transferring to room# 317 ?

## 2021-11-20 NOTE — Progress Notes (Addendum)
Transferred to room 317 from ICU.  Alert and oriented and lying flat on back since having lumbar puncture and knows to stay flat until 1645.  Restarted on LR at 51.  Talking on phone.  ?

## 2021-11-20 NOTE — Progress Notes (Signed)
? ? ? ? ? ? ? ?  Date: 11/20/2021 ? ?Patient name: James Hartman  ?Medical record number: [id number]  ?Date of birth: 07/09/1976  ? ? ?Patient is sp large volume LP ? ?Tests esp cultures will take a month to come back ? ?I will discuss with pathology on Monday sending tissue to [facility] and clarifying if stains for mycobacteria and fungi were indeed negative on tissue. ? ? ?James Hartman has an appointment on 12/23/2021 at 11am with Dr. Daiva Eves ? ?[facility] is located in the [facility] at ? ?[address] [place] in [place]. ? ?[address], which is located to the left of the elevators. ? ?Phone: [phone] ? ?Fax: [phone] ? ?[email or web address] ? ?He should arrive 30 minutes prior to his appointment. ? ?I will sign off for now ? ? ?Please call with further questions. ? ? ? ?Paulette Blanch Dam ?11/20/2021, 5:59 PM ? ?

## 2021-11-20 NOTE — Progress Notes (Signed)
Patient ambulating in room, no headache and bandaid on back in dry and intact ?

## 2021-11-21 DIAGNOSIS — D869 Sarcoidosis, unspecified: Secondary | ICD-10-CM | POA: Diagnosis present

## 2021-11-21 LAB — VDRL, CSF: VDRL Quant, CSF: NONREACTIVE

## 2021-11-21 MED ORDER — HYDROCHLOROTHIAZIDE 12.5 MG PO TABS: 12.5000 mg | ORAL_TABLET | Freq: Every day | ORAL | 2 refills | Status: DC

## 2021-11-21 MED ORDER — FOLIC ACID 1 MG PO TABS: 1.0000 mg | ORAL_TABLET | Freq: Every day | ORAL | 2 refills | Status: AC

## 2021-11-21 MED ORDER — PREDNISONE 10 MG PO TABS: 10.0000 mg | ORAL_TABLET | Freq: Every day | ORAL | 1 refills | Status: DC

## 2021-11-21 MED ORDER — VALSARTAN 80 MG PO TABS: 80.0000 mg | ORAL_TABLET | Freq: Every day | ORAL | 2 refills | Status: DC

## 2021-11-21 MED ORDER — POLYETHYLENE GLYCOL 3350 17 G PO PACK
17.0000 g | PACK | Freq: Every day | ORAL | 2 refills | Status: AC
Start: 2021-11-22 — End: ?

## 2021-11-21 NOTE — Discharge Instructions (Signed)
°  IMPORTANT INFORMATION: PAY CLOSE ATTENTION  ° °PHYSICIAN DISCHARGE INSTRUCTIONS ° °Follow with Primary care provider  Hunter, Denise, MD  and other consultants as instructed by your Hospitalist Physician ° °SEEK MEDICAL CARE OR RETURN TO EMERGENCY ROOM IF SYMPTOMS COME BACK, WORSEN OR NEW PROBLEM DEVELOPS  ° °Please note: °You were cared for by a hospitalist during your hospital stay. Every effort will be made to forward records to your primary care provider.  You can request that your primary care provider send for your hospital records if they have not received them.  Once you are discharged, your primary care physician will handle any further medical issues. Please note that NO REFILLS for any discharge medications will be authorized once you are discharged, as it is imperative that you return to your primary care physician (or establish a relationship with a primary care physician if you do not have one) for your post hospital discharge needs so that they can reassess your need for medications and monitor your lab values. ° °Please get a complete blood count and chemistry panel checked by your Primary MD at your next visit, and again as instructed by your Primary MD. ° °Get Medicines reviewed and adjusted: °Please take all your medications with you for your next visit with your Primary MD ° °Laboratory/radiological data: °Please request your Primary MD to go over all hospital tests and procedure/radiological results at the follow up, please ask your primary care provider to get all Hospital records sent to his/her office. ° °In some cases, they will be blood work, cultures and biopsy results pending at the time of your discharge. Please request that your primary care provider follow up on these results. ° °If you are diabetic, please bring your blood sugar readings with you to your follow up appointment with primary care.   ° °Please call and make your follow up appointments as soon as possible.   ° °Also Note  the following: °If you experience worsening of your admission symptoms, develop shortness of breath, life threatening emergency, suicidal or homicidal thoughts you must seek medical attention immediately by calling 911 or calling your MD immediately  if symptoms less severe. ° °You must read complete instructions/literature along with all the possible adverse reactions/side effects for all the Medicines you take and that have been prescribed to you. Take any new Medicines after you have completely understood and accpet all the possible adverse reactions/side effects.  ° °Do not drive when taking Pain medications or sleeping medications (Benzodiazepines) ° °Do not take more than prescribed Pain, Sleep and Anxiety Medications. It is not advisable to combine anxiety,sleep and pain medications without talking with your primary care practitioner ° °Special Instructions: If you have smoked or chewed Tobacco  in the last 2 yrs please stop smoking, stop any regular Alcohol  and or any Recreational drug use. ° °Wear Seat belts while driving.  Do not drive if taking any narcotic, mind altering or controlled substances or recreational drugs or alcohol.  ° ° ° ° ° ° °

## 2021-11-21 NOTE — Assessment & Plan Note (Signed)
Ambulatory referral to rheumatology made.  ?

## 2021-11-21 NOTE — Discharge Summary (Signed)
Physician Discharge Summary  ?Ferdinand LangoMatthew Schlie ZOX:[id number] DOB: 21-May-1976 DOA: 11/17/2021 ? ?PCP: Alvina FilbertHunter, Denise, MD ?Hem/onc: Ellin SabaKatragadda ?ID: Daiva EvesVan Dam  ? ?Admit date: 11/17/2021 ?Discharge date: 11/21/2021 ? ?Admitted From:  Home  ?Disposition: Home  ? ?Recommendations for Outpatient Follow-up:  ?Follow up with PCP in 1 weeks ?Follow up with Dr. Ellin SabaKatragadda on 11/23/21 as scheduled ?Follow up with Dr. Daiva EvesVan Dam on 12/13/21 as scheduled ?Establish care with rheumatology.  Ambulatory referral made.  ? ?Discharge Condition: STABLE   ?CODE STATUS: FULL  ?DIET: Heart healthy low sodium  ? ?Brief Hospitalization Summary: ?Please see all hospital notes, images, labs for full details of the hospitalization. ?46 y.o. male with medical history significant of hypertension, hyperlipidemia and obstructive sleep apnea who presents to the emergency department via EMS due to fever and altered mental status.  Patient was lethargic, though easily arousable with mild sternal rub, but he only opens his eyes, looks around and quickly goes back to sleep, he was unable to provide a history.  History was obtained from ED physician and ED medical record.  Per report, patient has poor communication Abilities at baseline.  EMS was activated at home, on arrival of EMS, patient was noted to have a fever of 104F axillary.  Patient has generalized lymphadenopathy and had a biopsy on 3/13 to rule out sarcoidosis/lymphoma.  The cause of the fever was unknown, IV hydration of 500 mL of LR was given en route to the ED.   ?  ?ED course: ?In the emergency department,Patient was febrile, tachypneic, tachycardic, BP was 191/100.  Work-up in the ED shows normocytic anemia, BUNs/creatinine 14/1.33 (baseline creatinine at 0.9-1.1), ABG showed hypoxia, lactic acid 2.1 > 4.6 > 4.2 > 1.4, urinalysis was positive for proteinuria and hematuria, but was unimpressive for UTI, procalcitonin 0.30, urine drug screen was positive for opiates and benzodiazepines. ?CT head  without contrast showed no acute infarct or hemorrhage, but showed confluent hypodensities throughout the periventricular white matter, in a pattern most consistent with chronic microangiopathic change, advanced for patient age.  Chest x-ray showed no definite pneumonia.  Patient was treated with IV vancomycin and cefepime, Tylenol  was given and cooling blanket was placed due to fever.  IV labetalol 20 mg x 1 was given due to high blood pressure, patient was provided with IV LR per sepsis protocol, Ativan and Flagyl were given as well.  Patient was also treated with Zovirax to cover for possible viral meningitis.   Patient was seen in the ED by Dr. Henreitta LeberBridges and on examination of the incision, it does not appear to be the cause of patient's septic state, there were no rashes or signs of cellulitis on the body per medical record.   Hospitalist was asked to admit patient for further evaluation and management. ? ?11/19/2021: mentation is slowly improving to baseline.  ID planning repeat LP with large volume to send fluid for other testing.   ? ?11/20/2021: Large volume LP completed, additional testing sent per ID recommendations.  Pt's mentation continues to improve.   ? ?11/21/2021: Pt feels back to his baseline mentation, no headache or back pain.  No fever or chills.  He would like to go home today.  He will follow up with Dr. Daiva EvesVan Dam and Dr. Ellin SabaKatragadda.   ? ?HOSPITAL COURSE BY PROBLEM LIST ? 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  Discussed with pulmonary, ID, neurology, no need to urgently start steroids at this time.  See consultant notes.   He  has follow up with Dr. Ellin Saba on 3/20.  ? ?Presumed Sarcoidosis ?Ambulatory referral to rheumatology made.  ? ?Meningoencephalitis ?RESOLVED NOW with supportive measures.  ? ?Neurosarcoidosis ?He has been restarted on regular prednisone 10 mg daily.  His symptoms have RESOLVED and he has returned to his baseline.   ? ?Acute metabolic  encephalopathy ?RESOLVED...THOUGHT SECONDARY TO neurosarcoidosis.  ? ?Meningoencephalitis  ?RESOLVED NOW.   ?Appreciate neurology consult and ID consult and recommendations.  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   ? ?ID ordered large volume LP for further testing.  He had done on 3/17.  He will follow up with Dr. Daiva Eves in April to discuss results.  ? ?CSF should be sent for MTB by PCR ?CSF histoplasma antigen ?CSF Blastomyces antigen ?CSF VDRL ? 8-10 ml for dedicated AFB stain and culture ?"" For dedicated fungal stain and culture with cultures incubating for at least 6 weeks.  ? ?Altered mental status ?RESOLVED with supportive measures.  ?He is back to his baseline mentation.  ? ?Lactic acidosis ?Lactic acid 2.1 > 4.6 > 4.2 > 1.4-resolved ? ? ?Severe sepsis (HCC) ?Sepsis physiology has resolved now.  No evidence of bacterial infection has been found.   ? ?Fever ?RESOLVED at this time.    ? ?Obstructive sleep apnea ?It is unknown if patient uses CPAP at home, he was noted to be hypoxic on room air ?Continue supplemental oxygen to maintain O2 sat > 92% ? ?Hyperlipidemia ?Resume home meds ? ?Essential hypertension ?Resumed all  Home BP meds.  ? ?Positive urine drug screen ?Urine drug screen was positive for benzodiazepine, Ativan was given in the ED, however, it was also positive for opiates, it is unknown if patient received opioid med prior to arrival to the ED  ? ?Acute respiratory failure with hypoxia (HCC) ?RESOLVED  ? ?Generalized lymphadenopathy ?Patient had a biopsy on 3/13 with findings of granulomatous lymphadenitis. No malignant cells seen on pathology.  AFB and fungal stains still outstanding.  ? ?Chronic constipation ?Added laxatives.  ?He is having bowel movements.  ? ?Discharge Diagnoses:  ?Principal Problem: ?  Granulomatous lymphadenitis ?Active Problems: ?  Severe sepsis (HCC) ?  Lactic acidosis ?  Altered mental status ?  Meningoencephalitis  ?  Acute metabolic encephalopathy ?   Neurosarcoidosis ?  Meningoencephalitis ?  Presumed Sarcoidosis ?  Generalized lymphadenopathy ?  Acute respiratory failure with hypoxia (HCC) ?  Positive urine drug screen ?  Essential hypertension ?  Hyperlipidemia ?  Obstructive sleep apnea ?  Fever ?  Chronic constipation ?  Bone marrow disease ?  Lumbar radiculopathy ?  Incontinence of feces ?  Urinary incontinence ?  Moderate persistent asthma ? ? ?Discharge Instructions: ?Discharge Instructions   ? ? Ambulatory referral to Rheumatology   Complete by: As directed ?  ? ?  ? ?Allergies as of 11/21/2021   ?No Known Allergies ?  ? ?  ?Medication List  ?  ? ?TAKE these medications   ? ?albuterol 108 (90 Base) MCG/ACT inhaler ?Commonly known as: VENTOLIN HFA ?Inhale 1-2 puffs into the lungs every 6 (six) hours as needed for wheezing or shortness of breath. ?  ?DULoxetine 60 MG capsule ?Commonly known as: CYMBALTA ?Take 60 mg by mouth 2 (two) times daily. ?  ?fluticasone 50 MCG/ACT nasal spray ?Commonly known as: FLONASE ?Place 1 spray into both nostrils daily as needed for allergies or rhinitis. ?  ?folic acid 1 MG tablet ?Commonly known as: FOLVITE ?Take 1 tablet (1 mg total) by mouth  daily. ?  ?gabapentin 300 MG capsule ?Commonly known as: NEURONTIN ?Take 300 mg by mouth 3 (three) times daily. ?  ?hydrochlorothiazide 12.5 MG tablet ?Commonly known as: HYDRODIURIL ?Take 1 tablet (12.5 mg total) by mouth daily. ?  ?HYDROcodone-acetaminophen 5-325 MG tablet ?Commonly known as: Norco ?Take 1 tablet by mouth every 4 (four) hours as needed for moderate pain. ?  ?ibuprofen 200 MG tablet ?Commonly known as: ADVIL ?Take 400 mg by mouth every 6 (six) hours as needed for mild pain. ?  ?polyethylene glycol 17 g packet ?Commonly known as: MIRALAX / GLYCOLAX ?Take 17 g by mouth daily. ?Start taking on: November 22, 2021 ?  ?pravastatin 40 MG tablet ?Commonly known as: PRAVACHOL ?Take 40 mg by mouth daily. ?  ?predniSONE 10 MG tablet ?Commonly known as: DELTASONE ?Take 10 mg by  mouth daily with breakfast. ?  ?valsartan 80 MG tablet ?Commonly known as: DIOVAN ?Take 1 tablet (80 mg total) by mouth daily. ?  ? ?  ? ? Follow-up Information   ? ? Daiva Eves, Lisette Grinder, MD. Go on 12/13/2021.   ?Special

## 2021-11-21 NOTE — TOC Transition Note (Signed)
Transition of Care (TOC) - CM/SW Discharge Note ? ? ?Patient Details  ?Name: James Hartman ?MRN: [id number] ?Date of Birth: 18-Oct-1975 ? ?Transition of Care (TOC) CM/SW Contact:  ?Boneta Lucks, RN ?Phone Number: ?11/21/2021, 12:16 PM ? ? ?Clinical Narrative:   Patient requested DME. MD ordered, TOC contacted [employer] to drop ship. Patient discharging home today.  ? ?Final next level of care: Home/Self Care ?Barriers to Discharge: Barriers Resolved ? ?Patient Goals and CMS Choice ?Patient states their goals for this hospitalization and ongoing recovery are:: to go home. ?CMS Medicare.gov Compare Post Acute Care list provided to:: Patient ?  ?           ?            ?DME Arranged: 3-N-1, Walker rolling ?DME Agency: [facility] ?Date DME Agency Contacted: 11/21/21 ?Time DME Agency Contacted: [phone] ?Representative spoke with at DME Agency: [place] ?  ?  ?  ?

## 2021-11-22 LAB — QUANTIFERON-TB GOLD PLUS: QuantiFERON-TB Gold Plus: NEGATIVE

## 2021-11-22 LAB — CULTURE, BLOOD (ROUTINE X 2)
Culture: NO GROWTH
Culture: NO GROWTH
Special Requests: ADEQUATE
Special Requests: ADEQUATE

## 2021-11-22 LAB — QUANTIFERON-TB GOLD PLUS (RQFGPL)
QuantiFERON Mitogen Value: >10 IU/mL
QuantiFERON Nil Value: 0.36 IU/mL
QuantiFERON TB1 Ag Value: 0.48 IU/mL
QuantiFERON TB2 Ag Value: 0.5 IU/mL

## 2021-11-22 LAB — ACID FAST SMEAR (AFB, MYCOBACTERIA): Acid Fast Smear: NEGATIVE

## 2021-11-22 LAB — CSF CULTURE W GRAM STAIN: Culture: NO GROWTH

## 2021-11-23 ENCOUNTER — Telehealth (HOSPITAL_COMMUNITY): Payer: Self-pay | Admitting: *Deleted

## 2021-11-23 ENCOUNTER — Other Ambulatory Visit (HOSPITAL_COMMUNITY): Payer: Self-pay | Admitting: *Deleted

## 2021-11-23 ENCOUNTER — Inpatient Hospital Stay (HOSPITAL_COMMUNITY): Payer: 59 | Admitting: Hematology

## 2021-11-23 DIAGNOSIS — R591 Generalized enlarged lymph nodes: Secondary | ICD-10-CM

## 2021-11-23 LAB — ANAEROBIC CULTURE W GRAM STAIN

## 2021-11-23 LAB — VDRL, CSF: VDRL Quant, CSF: NONREACTIVE

## 2021-11-23 NOTE — Telephone Encounter (Signed)
James Hartman was contacted by telephone to verify understanding of discharge instructions status post their most recent discharge from the hospital on the date:  11/18/21.  Inpatient discharge AVS was re-reviewed with patient, along with cancer center appointments.  Verification of understanding for oncology specific follow-up was validated using the Teach Back method.   ? ?Transportation to appointments were confirmed for the patient as being self/caregiver. ? ?James Hartman questions were addressed to their satisfaction upon completion of this post discharge follow-up call for outpatient oncology.  ?

## 2021-11-25 ENCOUNTER — Inpatient Hospital Stay (HOSPITAL_COMMUNITY): Payer: 59 | Attending: Hematology | Admitting: Hematology

## 2021-11-25 ENCOUNTER — Other Ambulatory Visit: Payer: Self-pay

## 2021-11-25 ENCOUNTER — Inpatient Hospital Stay (HOSPITAL_COMMUNITY): Payer: 59

## 2021-11-25 VITALS — BP 150/90 | HR 86 | Temp 98.2°F | Resp 18 | Ht 68.0 in | Wt 283.4 lb

## 2021-11-25 DIAGNOSIS — R29898 Other symptoms and signs involving the musculoskeletal system: Secondary | ICD-10-CM | POA: Diagnosis not present

## 2021-11-25 DIAGNOSIS — R591 Generalized enlarged lymph nodes: Secondary | ICD-10-CM

## 2021-11-25 DIAGNOSIS — R531 Weakness: Secondary | ICD-10-CM | POA: Insufficient documentation

## 2021-11-25 LAB — CBC WITH DIFFERENTIAL/PLATELET
Abs Immature Granulocytes: 0.01 10*3/uL (ref 0.00–0.07)
Basophils Absolute: 0 10*3/uL (ref 0.0–0.1)
Basophils Relative: 1 %
Eosinophils Absolute: 0.1 10*3/uL (ref 0.0–0.5)
Eosinophils Relative: 3 %
HCT: 35.9 % — ABNORMAL LOW (ref 39.0–52.0)
Hemoglobin: 11.5 g/dL — ABNORMAL LOW (ref 13.0–17.0)
Immature Granulocytes: 0 %
Lymphocytes Relative: 18 %
Lymphs Abs: 0.6 10*3/uL — ABNORMAL LOW (ref 0.7–4.0)
MCH: 29.9 pg (ref 26.0–34.0)
MCHC: 32 g/dL (ref 30.0–36.0)
MCV: 93.2 fL (ref 80.0–100.0)
Monocytes Absolute: 0.3 10*3/uL (ref 0.1–1.0)
Monocytes Relative: 8 %
Neutro Abs: 2.5 10*3/uL (ref 1.7–7.7)
Neutrophils Relative %: 70 %
Platelets: 211 10*3/uL (ref 150–400)
RBC: 3.85 MIL/uL — ABNORMAL LOW (ref 4.22–5.81)
RDW: 13.9 % (ref 11.5–15.5)
WBC: 3.5 10*3/uL — ABNORMAL LOW (ref 4.0–10.5)
nRBC: 0 % (ref 0.0–0.2)

## 2021-11-25 LAB — COMPREHENSIVE METABOLIC PANEL
ALT: 14 U/L (ref 0–44)
AST: 16 U/L (ref 15–41)
Albumin: 3.5 g/dL (ref 3.5–5.0)
Alkaline Phosphatase: 79 U/L (ref 38–126)
Anion gap: 9 (ref 5–15)
BUN: 11 mg/dL (ref 6–20)
CO2: 26 mmol/L (ref 22–32)
Calcium: 8.6 mg/dL — ABNORMAL LOW (ref 8.9–10.3)
Chloride: 106 mmol/L (ref 98–111)
Creatinine, Ser: 0.95 mg/dL (ref 0.61–1.24)
GFR, Estimated: >60 mL/min (ref >60)
Glucose, Bld: 83 mg/dL (ref 70–99)
Potassium: 3.9 mmol/L (ref 3.5–5.1)
Sodium: 141 mmol/L (ref 135–145)
Total Bilirubin: 1 mg/dL (ref 0.3–1.2)
Total Protein: 7.1 g/dL (ref 6.5–8.1)

## 2021-11-25 LAB — SURGICAL PATHOLOGY

## 2021-11-25 LAB — LACTATE DEHYDROGENASE: LDH: 160 U/L (ref 98–192)

## 2021-11-25 LAB — ANGIOTENSIN CONVERTING ENZYME, CSF: Angio Convert Enzyme: 2.8 U/L — ABNORMAL HIGH (ref 0.0–2.5)

## 2021-11-25 MED ORDER — FUROSEMIDE 20 MG PO TABS: 20.0000 mg | ORAL_TABLET | Freq: Every day | ORAL | 1 refills | Status: DC | PRN

## 2021-11-25 MED ORDER — PREDNISONE 20 MG PO TABS: 60.0000 mg | ORAL_TABLET | Freq: Every day | ORAL | 0 refills | Status: DC

## 2021-11-25 NOTE — Progress Notes (Signed)
? ?[facility] ?[address] ?[place],  [address] ? ? ?CLINIC:  ?Medical Oncology/Hematology ? ?PCP:  ?[name], MD ?[address] [place] [address] ?[phone] ? ? ?REASON FOR VISIT:  ?Follow-up for generalized lymphadenopathy ? ?PRIOR THERAPY: none ? ?NGS Results: not done ? ?CURRENT THERAPY: surveillance ? ?BRIEF ONCOLOGIC HISTORY:  ?Oncology History  ? No history exists.  ? ? ?CANCER STAGING: ? Cancer Staging  ?No matching staging information was found for the patient. ? ?INTERVAL HISTORY:  ?Mr. James Hartman, a 46 y.o. male, returns for routine follow-up of his generalized lymphadenopathy. James Hartman was last seen on 11/03/2021.  ? ?Today he reports feeling well.  ? ?REVIEW OF SYSTEMS:  ?Review of Systems  ?Constitutional:  Negative for appetite change and fatigue.  ?Cardiovascular:  Positive for leg swelling.  ?Musculoskeletal:  Positive for arthralgias (4/10 leg).  ?Neurological:  Positive for numbness.  ?All other systems reviewed and are negative. ? ?PAST MEDICAL/SURGICAL HISTORY:  ?Past Medical History:  ?Diagnosis Date  ? Hypertension   ? Sleep apnea   ? ?Past Surgical History:  ?Procedure Laterality Date  ? AXILLARY LYMPH NODE BIOPSY Right 11/16/2021  ? Procedure: AXILLARY LYMPH NODE BIOPSY;  Surgeon: Aviva Signs, MD;  Location: [facility];  Service: General;  Laterality: Right;  ? ? ?SOCIAL HISTORY:  ?Social History  ? ?Socioeconomic History  ? Marital status: Single  ?  Spouse name: Not on file  ? Number of children: Not on file  ? Years of education: Not on file  ? Highest education level: Not on file  ?Occupational History  ? Not on file  ?Tobacco Use  ? Smoking status: Former  ? Smokeless tobacco: Never  ?Vaping Use  ? Vaping Use: Never used  ?Substance and Sexual Activity  ? Alcohol use: Not Currently  ? Drug use: No  ? Sexual activity: Not on file  ?Other Topics Concern  ? Not on file  ?Social History Narrative  ? Not on file  ? ?Social Determinants of Health   ? ?Financial Resource Strain: Not on file  ?Food Insecurity: Not on file  ?Transportation Needs: Not on file  ?Physical Activity: Not on file  ?Stress: Not on file  ?Social Connections: Not on file  ?Intimate Partner Violence: Not on file  ? ? ?FAMILY HISTORY:  ?No family history on file. ? ?CURRENT MEDICATIONS:  ?Current Outpatient Medications  ?Medication Sig Dispense Refill  ? albuterol (VENTOLIN HFA) 108 (90 Base) MCG/ACT inhaler Inhale 1-2 puffs into the lungs every 6 (six) hours as needed for wheezing or shortness of breath.    ? DULoxetine (CYMBALTA) 60 MG capsule Take 60 mg by mouth 2 (two) times daily.    ? fluticasone (FLONASE) 50 MCG/ACT nasal spray Place 1 spray into both nostrils daily as needed for allergies or rhinitis.    ? folic acid (FOLVITE) 1 MG tablet Take 1 tablet (1 mg total) by mouth daily. 30 tablet 2  ? gabapentin (NEURONTIN) 300 MG capsule Take 300 mg by mouth 3 (three) times daily.    ? hydrochlorothiazide (HYDRODIURIL) 12.5 MG tablet Take 1 tablet (12.5 mg total) by mouth daily. 30 tablet 2  ? HYDROcodone-acetaminophen (NORCO) 5-325 MG tablet Take 1 tablet by mouth every 4 (four) hours as needed for moderate pain. 20 tablet 0  ? ibuprofen (ADVIL) 200 MG tablet Take 400 mg by mouth every 6 (six) hours as needed for mild pain.    ? polyethylene glycol (MIRALAX /  GLYCOLAX) 17 g packet Take 17 g by mouth daily. 30 each 2  ? pravastatin (PRAVACHOL) 40 MG tablet Take 40 mg by mouth daily.    ? predniSONE (DELTASONE) 20 MG tablet Take 3 tablets (60 mg total) by mouth daily with breakfast. 120 tablet 0  ? valsartan (DIOVAN) 80 MG tablet Take 1 tablet (80 mg total) by mouth daily. 30 tablet 2  ? ?No current facility-administered medications for this visit.  ? ? ?ALLERGIES:  ?No Known Allergies ? ?PHYSICAL EXAM:  ?Performance status (ECOG): 1 - Symptomatic but completely ambulatory ? ?Vitals:  ? 11/25/21 1203  ?BP: (!) 150/90  ?Pulse: 86  ?Resp: 18  ?Temp: 98.2 ?F (36.8 ?C)  ?SpO2: 100%  ? ?Wt  Readings from Last 3 Encounters:  ?11/25/21 283 lb 6.4 oz (128.5 kg)  ?11/20/21 282 lb 3 oz (128 kg)  ?11/16/21 281 lb (127.5 kg)  ? ?Physical Exam ?Vitals reviewed.  ?Constitutional:   ?   Appearance: Normal appearance. He is obese.  ?Cardiovascular:  ?   Rate and Rhythm: Normal rate and regular rhythm.  ?   Pulses: Normal pulses.  ?   Heart sounds: Normal heart sounds.  ?Pulmonary:  ?   Effort: Pulmonary effort is normal.  ?   Breath sounds: Normal breath sounds.  ?Musculoskeletal:  ?   Right lower leg: Edema (trace) present.  ?   Left lower leg: Edema (trace) present.  ?Neurological:  ?   General: No focal deficit present.  ?   Mental Status: He is alert and oriented to person, place, and time.  ?Psychiatric:     ?   Mood and Affect: Mood normal.     ?   Behavior: Behavior normal.  ?  ? ?LABORATORY DATA:  ?I have reviewed the labs as listed.  ? ?  Latest Ref Rng & Units 11/25/2021  ? 11:43 AM 11/20/2021  ?  4:26 AM 11/19/2021  ?  4:08 AM  ?CBC  ?WBC 4.0 - 10.5 K/uL 3.5   2.9   5.2    ?Hemoglobin 13.0 - 17.0 g/dL 11.5   9.4   10.8    ?Hematocrit 39.0 - 52.0 % 35.9   29.1   33.0    ?Platelets 150 - 400 K/uL 211   138   159    ? ? ?  Latest Ref Rng & Units 11/25/2021  ? 11:43 AM 11/20/2021  ?  4:26 AM 11/19/2021  ?  4:08 AM  ?CMP  ?Glucose 70 - 99 mg/dL 83   97   115    ?BUN 6 - 20 mg/dL 11   13   16     ?Creatinine 0.61 - 1.24 mg/dL 0.95   0.83   1.01    ?Sodium 135 - 145 mmol/L 141   134   136    ?Potassium 3.5 - 5.1 mmol/L 3.9   3.9   4.4    ?Chloride 98 - 111 mmol/L 106   104   101    ?CO2 22 - 32 mmol/L 26   27   27     ?Calcium 8.9 - 10.3 mg/dL 8.6   8.1   7.9    ?Total Protein 6.5 - 8.1 g/dL 7.1      ?Total Bilirubin 0.3 - 1.2 mg/dL 1.0      ?Alkaline Phos 38 - 126 U/L 79      ?AST 15 - 41 U/L 16      ?ALT 0 - 44 U/L  14      ? ? ?DIAGNOSTIC IMAGING:  ?I have independently reviewed the scans and discussed with the patient. ?CT Head Wo Contrast ? ?Result Date: 11/17/2021 ?CLINICAL DATA:  Altered level of consciousness,  fever, hypertension, lymphoma EXAM: CT HEAD WITHOUT CONTRAST TECHNIQUE: Contiguous axial images were obtained from the base of the skull through the vertex without intravenous contrast. RADIATION DOSE REDUCTION: This exam was performed according to the departmental dose-optimization program which includes automated exposure control, adjustment of the mA and/or kV according to patient size and/or use of iterative reconstruction technique. COMPARISON:  None. FINDINGS: Brain: Nonspecific confluent hypodensities are seen throughout the periventricular white matter. Pattern is most typical of chronic microangiopathic changes, though this would be advanced for a patient of this age. There are no specific findings on this unenhanced CT to suggest CNS lymphoma. If further evaluation is desired, MRI could be performed. No other signs of acute infarct or hemorrhage. Lateral ventricles and remaining midline structures are unremarkable. No acute extra-axial fluid collections. No mass effect. Vascular: No hyperdense vessel or unexpected calcification. Skull: Normal. Negative for fracture or focal lesion. Sinuses/Orbits: Near complete opacification of the bilateral maxillary sinuses. Significant mucoperiosteal thickening within the ethmoid and sphenoid sinuses. Other: None. IMPRESSION: 1. Confluent hypodensities throughout the periventricular white matter, in a pattern most consistent with chronic microangiopathic change, advanced for patient age. If further evaluation is desired, MRI could be performed. 2. Otherwise no evidence of acute infarct or hemorrhage. 3. Paranasal sinus disease as above. Electronically Signed   By: Randa Ngo M.D.   On: 11/17/2021 19:06  ? ?MR BRAIN WO CONTRAST ? ?Result Date: 11/18/2021 ?CLINICAL DATA:  Mental status change, unknown cause. History of lymphadenopathy and bone lesions on previous imaging. 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 EXAM: MRI HEAD  WITHOUT CONTRAST TECHNIQUE: Multiplanar, multiecho pulse sequences of the brain and surrounding structures were obtained without intravenous contrast. COMPARISON:  Head CT 11/17/2021. Thoracic spine MRI 02/27/2

## 2021-11-25 NOTE — Patient Instructions (Addendum)
[facility] at [facility] ?Discharge Instructions ? ? ?You were seen and examined today by Dr. Delton Coombes. ? ?He reviewed the results of your lymph node biopsy.  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  We will refer you to the sarcoidosis clinic at [facility].  ? ?We will see you back in a few months to see how you are doing.  ? ? ?Thank you for choosing [place] at [facility] to provide your oncology and hematology care.  To afford each patient quality time with our provider, please arrive at least 15 minutes before your scheduled appointment time.  ? ?If you have a lab appointment with the [facility] please come in thru the Main Entrance and check in at the main information desk. ? ?You need to re-schedule your appointment should you arrive 10 or more minutes late.  We strive to give you quality time with our providers, and arriving late affects you and other patients whose appointments are after yours.  Also, if you no show three or more times for appointments you may be dismissed from the clinic at the providers discretion.     ?Again, thank you for choosing [facility].  Our hope is that these requests will decrease the amount of time that you wait before being seen by our physicians.       ?_____________________________________________________________ ? ?Should you have questions after your visit to [facility], please contact our office at [phone] and follow the prompts.  Our office hours are 8:00 a.m. and 4:30 p.m. Monday - Friday.  Please note that voicemails left after 4:00 p.m. may not be returned until the following business day.  We are closed weekends and major holidays.  You do have access to a nurse 24-7, just call the main number to the clinic [phone] and do not press any options, hold on the line and a nurse will answer the phone.   ? ?For prescription refill requests, have your pharmacy contact our office and allow 72  hours.   ? ?Due to Covid, you will need to wear a mask upon entering the hospital. If you do not have a mask, a mask will be given to you at the Main Entrance upon arrival. For doctor visits, patients may have 1 support person age 64 or older with them. For treatment visits, patients can not have anyone with them due to social distancing guidelines and our immunocompromised population.  ? ?   ?

## 2021-11-26 ENCOUNTER — Encounter (HOSPITAL_COMMUNITY): Payer: Self-pay | Admitting: Lab

## 2021-11-26 NOTE — Progress Notes (Unsigned)
Referral to [facility] @ Dr Tami Ribas.  Records sent on 3/34.  They will review and call pt ?

## 2021-11-27 LAB — BLASTOMYCES ANTIGEN: Blastomyces Antigen: NOT DETECTED ng/mL

## 2021-11-30 ENCOUNTER — Telehealth: Payer: Self-pay | Admitting: Infectious Disease

## 2021-11-30 NOTE — Telephone Encounter (Signed)
I have brought forms to the pathology office so that his granulomatous lymph node can be sent [facility] for PCR testing note his AFB and fungal smears were negative on pathology specimen ?

## 2021-12-01 LAB — MISC LABCORP TEST (SEND OUT): Labcorp test code: 9985

## 2021-12-02 ENCOUNTER — Ambulatory Visit (INDEPENDENT_AMBULATORY_CARE_PROVIDER_SITE_OTHER): Payer: Self-pay | Admitting: *Deleted

## 2021-12-02 ENCOUNTER — Other Ambulatory Visit: Payer: Self-pay

## 2021-12-02 VITALS — Ht 68.0 in | Wt 280.8 lb

## 2021-12-02 DIAGNOSIS — Z1211 Encounter for screening for malignant neoplasm of colon: Secondary | ICD-10-CM

## 2021-12-02 LAB — BLASTOMYCES ANTIGEN: Blastomyces Antigen: NOT DETECTED ng/mL

## 2021-12-02 NOTE — Progress Notes (Signed)
Gastroenterology Pre-Procedure Review ? ?Request Date: 12/02/2021 ?Requesting Physician: Dr. Orpah Cobb, no previous TCS ? ?PATIENT REVIEW QUESTIONS: The patient responded to the following health history questions as indicated:   ? ?1. Diabetes Melitis: no, pre-diabetic but managed with diet and exercise ?2. Joint replacements in the past 12 months: no ?3. Major health problems in the past 3 months: yes, lymph nodes removed last week on 11/23/2021 ?4. Has an artificial valve or MVP: no ?5. Has a defibrillator: no ?6. Has been advised in past to take antibiotics in advance of a procedure like teeth cleaning: no ?7. Family history of colon cancer: no  ?8. Alcohol Use: no ?9. Illicit drug Use: no ?10. History of sleep apnea: yes, CPAP  ?11. History of coronary artery or other vascular stents placed within the last 12 months: no ?12. History of any prior anesthesia complications: no ?13. Body mass index is 42.7 kg/m?. ?   ?MEDICATIONS & ALLERGIES:    ?Patient reports the following regarding taking any blood thinners:   ?Plavix? no ?Aspirin? no ?Coumadin? no ?Brilinta? no ?Xarelto? no ?Eliquis? no ?Pradaxa? no ?Savaysa? no ?Effient? no ? ?Patient confirms/reports the following medications:  ?Current Outpatient Medications  ?Medication Sig Dispense Refill  ? albuterol (VENTOLIN HFA) 108 (90 Base) MCG/ACT inhaler Inhale 1-2 puffs into the lungs every 6 (six) hours as needed for wheezing or shortness of breath.    ? DULoxetine (CYMBALTA) 60 MG capsule Take 60 mg by mouth 2 (two) times daily.    ? fluticasone (FLONASE) 50 MCG/ACT nasal spray Place 1 spray into both nostrils daily as needed for allergies or rhinitis.    ? folic acid (FOLVITE) 1 MG tablet Take 1 tablet (1 mg total) by mouth daily. 30 tablet 2  ? furosemide (LASIX) 20 MG tablet Take 1 tablet (20 mg total) by mouth daily as needed. 30 tablet 1  ? hydrochlorothiazide (HYDRODIURIL) 12.5 MG tablet Take 1 tablet (12.5 mg total) by mouth daily. 30 tablet 2  ?  HYDROcodone-acetaminophen (NORCO) 5-325 MG tablet Take 1 tablet by mouth every 4 (four) hours as needed for moderate pain. 20 tablet 0  ? polyethylene glycol (MIRALAX / GLYCOLAX) 17 g packet Take 17 g by mouth daily. 30 each 2  ? pravastatin (PRAVACHOL) 40 MG tablet Take 40 mg by mouth daily.    ? predniSONE (DELTASONE) 20 MG tablet Take 3 tablets (60 mg total) by mouth daily with breakfast. 120 tablet 0  ? valsartan (DIOVAN) 80 MG tablet Take 1 tablet (80 mg total) by mouth daily. 30 tablet 2  ? ?No current facility-administered medications for this visit.  ? ? ?Patient confirms/reports the following allergies:  ?No Known Allergies ? ?No orders of the defined types were placed in this encounter. ? ? ?AUTHORIZATION INFORMATION ?Primary Insurance: Medicaid [employer],  ID #: [id number] P,  Group #: [id number] ?Pre-Cert / Berkley Harvey required:  ?Pre-Cert / Auth #:  ? ?SCHEDULE INFORMATION: ?Procedure has been scheduled as follows:  ?Date: , Time:   ?Location: [facility] with Dr. Marletta Lor ? ?This Gastroenterology Pre-Precedure Review Form is being routed to the following provider(s): Tana Coast, PA-C ?  ?

## 2021-12-04 LAB — BLASTOMYCES ANTIGEN: Blastomyces Antigen: NOT DETECTED ng/mL

## 2021-12-09 ENCOUNTER — Telehealth: Payer: Self-pay | Admitting: Infectious Disease

## 2021-12-09 LAB — CULTURE, FUNGUS WITHOUT SMEAR

## 2021-12-09 NOTE — Telephone Encounter (Signed)
[facility] PCR's not all back but ones I have on his LN ? ? ? ? ? ? ? ? ? ? ? ? ? ? ? ?

## 2021-12-12 NOTE — Progress Notes (Signed)
He has several medical issues going on neurological symptoms, weakness, lymphadenopathy. I feel he should have an OV prior to scheduling screening colonoscopy.  ?

## 2021-12-14 ENCOUNTER — Telehealth: Payer: Self-pay | Admitting: *Deleted

## 2021-12-14 NOTE — Progress Notes (Signed)
Lmom for pt to call me back. 

## 2021-12-14 NOTE — Telephone Encounter (Signed)
Returning call to schedule colonoscopy.[phone] ?

## 2021-12-14 NOTE — Telephone Encounter (Signed)
Tried to call pt.  Had to leave a voice mail. 

## 2021-12-15 NOTE — Progress Notes (Signed)
Left message with a lady for pt to call us back. ?

## 2021-12-16 NOTE — Progress Notes (Signed)
Informed pt that ov needed to arrange TCS due to medical hx.  Scheduled ov for 12/23/2021 at 1:30 with Tana Coast, PA-C. ?

## 2021-12-21 LAB — FUNGUS CULTURE RESULT

## 2021-12-21 LAB — FUNGUS CULTURE WITH STAIN

## 2021-12-21 LAB — FUNGAL ORGANISM REFLEX

## 2021-12-23 ENCOUNTER — Encounter: Payer: Self-pay | Admitting: Infectious Disease

## 2021-12-23 ENCOUNTER — Other Ambulatory Visit: Payer: Self-pay

## 2021-12-23 ENCOUNTER — Ambulatory Visit: Payer: 59 | Admitting: Gastroenterology

## 2021-12-23 ENCOUNTER — Ambulatory Visit (INDEPENDENT_AMBULATORY_CARE_PROVIDER_SITE_OTHER): Payer: 59 | Admitting: Infectious Disease

## 2021-12-23 VITALS — BP 135/85 | HR 114 | Temp 98.0°F | Wt 293.0 lb

## 2021-12-23 DIAGNOSIS — I888 Other nonspecific lymphadenitis: Secondary | ICD-10-CM | POA: Diagnosis not present

## 2021-12-23 DIAGNOSIS — I10 Essential (primary) hypertension: Secondary | ICD-10-CM

## 2021-12-23 DIAGNOSIS — D869 Sarcoidosis, unspecified: Secondary | ICD-10-CM | POA: Diagnosis not present

## 2021-12-23 DIAGNOSIS — R591 Generalized enlarged lymph nodes: Secondary | ICD-10-CM | POA: Diagnosis not present

## 2021-12-23 DIAGNOSIS — D759 Disease of blood and blood-forming organs, unspecified: Secondary | ICD-10-CM

## 2021-12-23 DIAGNOSIS — G0490 Encephalitis and encephalomyelitis, unspecified: Secondary | ICD-10-CM

## 2021-12-23 DIAGNOSIS — M5416 Radiculopathy, lumbar region: Secondary | ICD-10-CM | POA: Diagnosis not present

## 2021-12-23 DIAGNOSIS — D8689 Sarcoidosis of other sites: Secondary | ICD-10-CM

## 2021-12-23 MED ORDER — PREDNISONE 20 MG PO TABS: 80.0000 mg | ORAL_TABLET | Freq: Every day | ORAL | 1 refills | Status: AC

## 2021-12-23 NOTE — Progress Notes (Signed)
? ? ?GI Office Note   ? ?Referring Provider: [name], MD ?Primary Care Physician:  [name], MD ?Primary GI: Dr. Gala Romney ? ? ?Chief Complaint  ? ?Chief Complaint  ?Patient presents with  ? Colonoscopy  ?  No other issues to discuss.   ? ? ? ?History of Present Illness  ? ?James Hartman is a 46 y.o. male with a history of HTN, OSA (uses CPAP), presenting today at the request of [name], MD to schedule screening colonoscopy.  ? ?Per chart review patient has had ongoing neurologic symptoms that have become more progressive dating back to June 2022 when he began having leg pain and lower extremity weakness. He has been seen frequently by PCP at [facility] and by [facility] neurology regarding decrease in mobility, BLE weakness, paresthesia, falls, tremors, and AMS. He has been following with Dr. Delton Coombes as well for numbness around waist, legs, feet, multiple falls, ED, and urinary and fecal incontinence. He had imaging evidence of lymphadenopathy. 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 Some CSF labs remain pending.  ? ?Hospitalized 3/14 -3/18 for AMS and fever, had microencephalitis.  He was lethargic and unarousable at home.  He reports he was in the ICU for 2 days.  He states that the last thing he remembered prior to that was taking pain medication after his biopsy.  ? ?Most recent labs 11/25/21 - Hgb 11.5 (stable, recent baseline 12 range) , MCV 93.2, plts 211, normal LFTs and renal function. ? ?Today: ?Denies diarrhea, recent weight loss, lack of appetite, early satiety, abdominal pain, melena, hematochezia,  hematemesis, chest pain, and shortness of breath.  He reports actually an increase in his appetite and some weight gain recently since being put on steroids for his sarcoidosis.  He recently changed his diet and has been eating more grilled and baked foods and staying away from fried foods and foods higher in carbs.  He also has cut back on sweets.  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  He reports that he does not usually feel very fatigued.  He does continue to have some numbness and heavy feeling to his legs, he walks with a cane, and does report that he fell 2 days ago coming out of his house as his legs felt like they just came out from under him.  Denies any injuries. ? ? ?Past Medical History:  ?Diagnosis Date  ? Hypertension   ? Sleep apnea   ? ? ?Past Surgical History:  ?Procedure Laterality Date  ? AXILLARY LYMPH NODE BIOPSY Right 11/16/2021  ? Procedure: AXILLARY LYMPH NODE BIOPSY;  Surgeon: Aviva Signs, MD;  Location: [facility];  Service: General;  Laterality: Right;  ? ? ?Current Outpatient Medications  ?Medication Sig Dispense Refill  ? albuterol (VENTOLIN HFA) 108 (90 Base) MCG/ACT inhaler Inhale 1-2 puffs into the lungs every 6 (six) hours as needed for wheezing or shortness of breath.    ? DULoxetine (CYMBALTA) 60 MG capsule Take 60 mg by mouth 2 (two) times daily.    ?  fluticasone (FLONASE) 50 MCG/ACT nasal spray Place 1 spray into both nostrils daily as needed for allergies or rhinitis.    ? folic acid (FOLVITE) 1 MG tablet Take 1 tablet (1 mg total) by mouth daily. 30 tablet 2  ? furosemide (LASIX) 20 MG tablet Take 1 tablet (20 mg total) by mouth daily as needed. 30 tablet 1  ? hydrochlorothiazide (HYDRODIURIL) 12.5 MG tablet Take 1 tablet (12.5 mg total) by mouth daily. 30 tablet 2  ? HYDROcodone-acetaminophen (NORCO) 5-325 MG tablet Take 1 tablet by  mouth every 4 (four) hours as needed for moderate pain. 20 tablet 0  ? polyethylene glycol (MIRALAX / GLYCOLAX) 17 g packet Take 17 g by mouth daily. 30 each 2  ? pravastatin (PRAVACHOL) 40 MG tablet Take 40 mg by mouth daily.    ? predniSONE (DELTASONE) 20 MG tablet Take 4 tablets (80 mg total) by mouth daily with breakfast. 120 tablet 1  ? valsartan (DIOVAN) 80 MG tablet Take 1 tablet (80 mg total) by mouth daily. 30 tablet 2  ? ?No current facility-administered medications for this visit.  ? ? ?Allergies as of 12/24/2021  ? (No Known Allergies)  ? ? ?Family History  ?Problem Relation Age of Onset  ? Cancer Mother   ? Hypertension Mother   ? Heart disease Father   ? Prostate cancer Maternal Grandfather   ? Breast cancer Maternal Aunt   ? Colon cancer Neg Hx   ? Colon polyps Neg Hx   ? ? ?Social History  ? ?Socioeconomic History  ? Marital status: Single  ?  Spouse name: Not on file  ? Number of children: Not on file  ? Years of education: Not on file  ? Highest education level: Not on file  ?Occupational History  ? Not on file  ?Tobacco Use  ? Smoking status: Former  ? Smokeless tobacco: Never  ?Vaping Use  ? Vaping Use: Never used  ?Substance and Sexual Activity  ? Alcohol use: Not Currently  ? Drug use: No  ? Sexual activity: Not Currently  ?Other Topics Concern  ? Not on file  ?Social History Narrative  ? Not on file  ? ?Social Determinants of Health  ? ?Financial Resource Strain: Not on file  ?Food Insecurity: Not on file  ?Transportation Needs: Not on file  ?Physical Activity: Not on file  ?Stress: Not on file  ?Social Connections: Not on file  ?Intimate Partner Violence: Not on file  ? ? ? ?Review of Systems  ? ?Gen: Denies any fever, chills, fatigue, weight loss, lack of appetite.  ?CV: Denies chest pain, heart palpitations, peripheral edema, syncope.  ?Resp: Denies shortness of breath at rest or with exertion. Denies wheezing or cough.  ?GI: see HPI ?GU : Denies urinary burning, urinary frequency, urinary  hesitancy ?MS: Denies joint pain, muscle weakness, cramps, or limitation of movement.  ?Derm: Denies rash, itching, dry skin ?Psych: Denies depression, anxiety, memory loss, and confusion ?Heme: Denies bruising, bleeding, and enlarged lymph nodes. ? ? ?Physical Exam  ? ?BP 118/62 (BP Location: Right Arm, Patient Position: Sitting, Cuff Size: Large)   Pulse (!) 115   Temp (!) 97.5 ?F (36.4 ?C) (Temporal)   Ht _0  (1.702 m)   Wt 298 lb 9.6 oz (135.4 kg)   SpO2 95%   BMI 46.77 kg/m?  ? ?General:   Alert and oriented. Pleasant and cooperative. Well-nourished and well-developed.  ?Head:  Normocephalic and atraumatic. ?Eyes:  Without icterus, sclera clear and conjunctiva  pink.  ?Ears:  Normal auditory acuity.Marland Kitchen  ?Lungs:  Clear to auscultation bilaterally. No wheezes, rales, or rhonchi. No distress.  ?Heart:  S1, S2 present without murmurs appreciated.  ?Abdomen:  +BS, slightly hypoactive.  Soft, non-tender and non-distended, rounded. No HSM noted. No guarding or rebound. No masses appreciated.  ?Rectal:  Deferred  ?Msk: Walks with a cane.  Normal posture. ?Extremities: No edema appreciated.  Numbness bilateral lower extremities right > left.  ?Neurologic:  Alert and  oriented x4;  grossly normal neurologically. ?Skin:  Intact without significant lesions or rashes. ?Psych:  Alert and cooperative. Normal mood and affect. ? ? ?Assessment  ? ?James Hartman is a 46 y.o. male with a history of HTN, OSA (uses CPAP), and newly diagnosed sarcoidosis (neurosarcoidosis) confirmed by axillary node biopsy and recently started on steroids presenting today with need to schedule first screening colonoscopy.  ? ?Need for screening colonoscopy: Celiac adenopathy noted on recent imaging. Patient has sarcoidosis. Recent CT imaging noted prominent stool throughout the colon. He has a strong family history of various cancers, but denies any known colon cancer. Previously tolerated anesthesia well during axillary node biopsy on  11/16/21 (ASA 2).  No alarm symptoms present.  He will require extra prep instructions due to constipation.  Will proceed with colonoscopy with propofol with Dr. Gala Romney in the near future.  ASA 3 due to BMI. ? ?Constipation:

## 2021-12-23 NOTE — Progress Notes (Signed)
? ?Chief complaint: Recent fall with continued lower extremity weakness ? ? ?Subjective:  ? ? Patient ID: James Hartman, male    DOB: July 28, 1976, 46 y.o.   MRN: [id number] ? ?HPI ? ?  ?James Hartman is a 46 y.o. male with nearly a year long history of lower extremity weakness with numbness in his legs then problems with urinary fecal incontinence status post EMG showing L5 radiculopathy, MRI and then CT scan showing extensive lymphadenopathy but also lit up on PET scan as well as several bones that had heterogeneous bone marrow features on MRI and CT.  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 ? ?He presented to the ER with fevers to 104 and encephalopathy with an MRI showing extensive abnormal white matter disease consistent with potentially sarcoidosis versus infectious etiology.  CSF showed a monocytic lymphocytic predominant pleocytosis that was fairly mild with negative Gram stain and culture. ? ?I saw him using telehealth feature while he was in the ICU at [facility]. ? ?I organize a repeat lumbar puncture as mentioned with large-volume CSF taken. ? ?CSF was negative for MTB by PCR Histoplasma Blastomyces by antigen testing on PCR as well as on urine his cryptococcal antigen was negative and serum and on CSF. ? ?His fungal cultures from CSF were held for at least 4 weeks and were with no growth. ? ?I also had his lymph node from his right axilla sent to [facility] for PCR testing and it was negative for nontuberculous mycobacteria tuberculous mycobacteria, fungi bacteria as well as unusual organism such as Bartonella and T whippelei. ? ? ?Seen by hematology for follow-up and been placed on prednisone at 60 mg a day.  He still is having significant lower extremity weakness and in fact fell to the 2 days ago and took quite a bit of time to get back up again. ? ?He has been referred to sarcoidosis experts at [facility]  but is not heard from them yet. ? ? ? ? ?Past Medical History:  ?Diagnosis Date  ? Hypertension   ? Sleep apnea   ? ? ?Past Surgical History:  ?Procedure Laterality Date  ? AXILLARY LYMPH NODE BIOPSY Right 11/16/2021  ? Procedure: AXILLARY LYMPH NODE BIOPSY;  Surgeon: Franky Macho, MD;  Location: [facility];  Service: General;  Laterality: Right;  ? ? ?No family history on file. ? ?  ?Social History  ? ?Socioeconomic History  ? Marital status: Single  ?  Spouse name: Not on file  ? Number of children: Not on file  ? Years of education: Not on file  ? Highest education level: Not on file  ?Occupational History  ? Not on file  ?Tobacco Use  ? Smoking status: Former  ? Smokeless tobacco: Never  ?Vaping Use  ? Vaping Use: Never used  ?Substance and Sexual Activity  ? Alcohol use: Not Currently  ? Drug use: No  ? Sexual activity: Not on file  ?Other Topics Concern  ? Not on file  ?Social History Narrative  ? Not on file  ? ?Social Determinants of Health  ? ?Financial Resource Strain: Not on file  ?Food Insecurity: Not on file  ?Transportation Needs: Not on file  ?Physical Activity: Not on file  ?Stress: Not on file  ?Social Connections: Not on file  ? ? ?No Known Allergies ? ? ?Current Outpatient Medications:  ?  albuterol (VENTOLIN HFA) 108 (90 Base) MCG/ACT inhaler, Inhale 1-2 puffs into the  lungs every 6 (six) hours as needed for wheezing or shortness of breath., Disp: , Rfl:  ?  DULoxetine (CYMBALTA) 60 MG capsule, Take 60 mg by mouth 2 (two) times daily., Disp: , Rfl:  ?  fluticasone (FLONASE) 50 MCG/ACT nasal spray, Place 1 spray into both nostrils daily as needed for allergies or rhinitis., Disp: , Rfl:  ?  folic acid (FOLVITE) 1 MG tablet, Take 1 tablet (1 mg total) by mouth daily., Disp: 30 tablet, Rfl: 2 ?  furosemide (LASIX) 20 MG tablet, Take 1 tablet (20 mg total) by mouth daily as needed., Disp: 30 tablet, Rfl: 1 ?  hydrochlorothiazide (HYDRODIURIL) 12.5 MG tablet, Take 1 tablet (12.5 mg total) by mouth daily.,  Disp: 30 tablet, Rfl: 2 ?  HYDROcodone-acetaminophen (NORCO) 5-325 MG tablet, Take 1 tablet by mouth every 4 (four) hours as needed for moderate pain., Disp: 20 tablet, Rfl: 0 ?  polyethylene glycol (MIRALAX / GLYCOLAX) 17 g packet, Take 17 g by mouth daily., Disp: 30 each, Rfl: 2 ?  pravastatin (PRAVACHOL) 40 MG tablet, Take 40 mg by mouth daily., Disp: , Rfl:  ?  predniSONE (DELTASONE) 20 MG tablet, Take 3 tablets (60 mg total) by mouth daily with breakfast., Disp: 120 tablet, Rfl: 0 ?  valsartan (DIOVAN) 80 MG tablet, Take 1 tablet (80 mg total) by mouth daily., Disp: 30 tablet, Rfl: 2 ? ? ?Review of Systems  ?Constitutional:  Negative for activity change, appetite change, chills, diaphoresis, fatigue, fever and unexpected weight change.  ?HENT:  Negative for congestion, rhinorrhea, sinus pressure, sneezing, sore throat and trouble swallowing.   ?Eyes:  Negative for photophobia and visual disturbance.  ?Respiratory:  Negative for cough, chest tightness, shortness of breath, wheezing and stridor.   ?Cardiovascular:  Negative for chest pain, palpitations and leg swelling.  ?Gastrointestinal:  Negative for abdominal distention, abdominal pain, anal bleeding, blood in stool, constipation, diarrhea, nausea and vomiting.  ?Genitourinary:  Negative for difficulty urinating, dysuria, flank pain and hematuria.  ?Musculoskeletal:  Negative for arthralgias, back pain, gait problem, joint swelling and myalgias.  ?Skin:  Negative for color change, pallor, rash and wound.  ?Neurological:  Positive for weakness. Negative for dizziness, tremors and light-headedness.  ?Hematological:  Negative for adenopathy. Does not bruise/bleed easily.  ?Psychiatric/Behavioral:  Negative for agitation, behavioral problems, confusion, decreased concentration, dysphoric mood and sleep disturbance.   ? ?   ?Objective:  ? Physical Exam ?Constitutional:   ?   Appearance: He is well-developed. He is obese.  ?HENT:  ?   Head: Normocephalic and  atraumatic.  ?Eyes:  ?   Conjunctiva/sclera: Conjunctivae normal.  ?Cardiovascular:  ?   Rate and Rhythm: Normal rate and regular rhythm.  ?Pulmonary:  ?   Effort: Pulmonary effort is normal. No respiratory distress.  ?   Breath sounds: No wheezing.  ?Abdominal:  ?   General: There is no distension.  ?   Palpations: Abdomen is soft.  ?Musculoskeletal:     ?   General: No tenderness. Normal range of motion.  ?   Cervical back: Normal range of motion and neck supple.  ?Skin: ?   General: Skin is warm and dry.  ?   Coloration: Skin is not pale.  ?   Findings: No erythema or rash.  ?Neurological:  ?   Mental Status: He is alert and oriented to person, place, and time.  ?Psychiatric:     ?   Mood and Affect: Mood normal.     ?  Behavior: Behavior normal.     ?   Thought Content: Thought content normal.     ?   Judgment: Judgment normal.  ? ? ? ? ? ?   ?Assessment & Plan:  ? ?Extensive sarcoidosis and including neurosarcoidosis: ? ?He does not seem to be finding the prednisone at 60 mg he is having some improvement in urinary and fecal incontinence but still having extensive lower extremity weakness. ? ?I am increasing his prednisone to 80 mg/day and giving him a month supply because he was about to run out of his current pulse. ? ?It is imperative that he get into care with the sarcoidosis expert as soon as possible. ? ?I have scheduled him to see me in roughly a month as well in case he is somehow not made that appointment though I am by no means an expert in the treatment of neurosarcoidosis or sarcoidosis in general. ? 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 ? ?I spent 841 minutes with the patient including than 50% of the time in face to face counseling of the patient sarcoidosis, along with review of medical records in preparation for the visit and during the visit and in coordination of his care. ? ?

## 2021-12-24 ENCOUNTER — Ambulatory Visit (INDEPENDENT_AMBULATORY_CARE_PROVIDER_SITE_OTHER): Payer: 59 | Admitting: Gastroenterology

## 2021-12-24 ENCOUNTER — Encounter: Payer: Self-pay | Admitting: Gastroenterology

## 2021-12-24 VITALS — BP 118/62 | HR 115 | Temp 97.5°F | Ht 67.0 in | Wt 298.6 lb

## 2021-12-24 DIAGNOSIS — K5900 Constipation, unspecified: Secondary | ICD-10-CM

## 2021-12-24 DIAGNOSIS — Z1211 Encounter for screening for malignant neoplasm of colon: Secondary | ICD-10-CM | POA: Diagnosis not present

## 2021-12-24 DIAGNOSIS — D649 Anemia, unspecified: Secondary | ICD-10-CM | POA: Diagnosis not present

## 2021-12-24 DIAGNOSIS — D869 Sarcoidosis, unspecified: Secondary | ICD-10-CM

## 2021-12-24 NOTE — Patient Instructions (Addendum)
Is a pleasure to meet you today.  We are scheduling you for colonoscopy with propofol with Dr. Jena Gauss in the near future. ? ?Procedure instructions: ?Clear liquid diet the day before the procedure ?Wanted to take bisacodyl 10 mg daily for 3 days prior to procedure. ? ?As discussed I want you to continue taking MiraLAX 1 capful daily and add Benefiber 2 teaspoons daily for constipation.  I also recommend for you to continue to follow a heart healthy diet with a limit of 2000 mg of sodium daily.  Continue Lasix as needed for peripheral edema. ? ?Also recommend for you to contact [facility] in regards to your recent referral.  Phone number is [phone].  ? ?It was a pleasure to see you today. I want to create trusting relationships with patients. If you receive a survey regarding your visit,  I greatly appreciate you taking time to fill this out on paper or through your MyChart. I value your feedback. ? ?Brooke Bonito, MSN, FNP-BC, AGACNP-BC ?[facility] ? ? ?

## 2022-01-04 LAB — ACID FAST CULTURE WITH REFLEXED SENSITIVITIES (MYCOBACTERIA)
Acid Fast Culture: NEGATIVE
Acid Fast Culture: NEGATIVE

## 2022-01-07 ENCOUNTER — Telehealth: Payer: Self-pay | Admitting: *Deleted

## 2022-01-07 NOTE — Telephone Encounter (Signed)
Called pt, left message. Needs to schedule TCS with Dr. Jena Gauss, asa 3 ?

## 2022-01-12 ENCOUNTER — Telehealth: Payer: Self-pay

## 2022-01-12 NOTE — Telephone Encounter (Signed)
Pt LMOVM to call didn't say why. Toni Amend seen him last ?

## 2022-01-12 NOTE — Telephone Encounter (Signed)
Spoke to pt. He stated he called the wrong office.  ?

## 2022-01-19 ENCOUNTER — Other Ambulatory Visit (HOSPITAL_COMMUNITY): Payer: Self-pay

## 2022-01-19 ENCOUNTER — Encounter: Payer: Self-pay | Admitting: Infectious Disease

## 2022-01-19 ENCOUNTER — Other Ambulatory Visit: Payer: Self-pay

## 2022-01-19 ENCOUNTER — Ambulatory Visit (INDEPENDENT_AMBULATORY_CARE_PROVIDER_SITE_OTHER): Payer: 59 | Admitting: Infectious Disease

## 2022-01-19 VITALS — BP 134/84 | HR 103 | Temp 97.8°F | Wt 302.6 lb

## 2022-01-19 DIAGNOSIS — W19XXXD Unspecified fall, subsequent encounter: Secondary | ICD-10-CM | POA: Diagnosis not present

## 2022-01-19 DIAGNOSIS — R600 Localized edema: Secondary | ICD-10-CM

## 2022-01-19 DIAGNOSIS — G0490 Encephalitis and encephalomyelitis, unspecified: Secondary | ICD-10-CM | POA: Diagnosis not present

## 2022-01-19 DIAGNOSIS — D8689 Sarcoidosis of other sites: Secondary | ICD-10-CM | POA: Diagnosis not present

## 2022-01-19 MED ORDER — SULFAMETHOXAZOLE-TRIMETHOPRIM 800-160 MG PO TABS: 1.0000 | ORAL_TABLET | Freq: Every day | ORAL | 5 refills | Status: DC

## 2022-01-19 NOTE — Progress Notes (Signed)
? ?Chief complaint: Still feeling weaker and weaker and particularly in his proximal lower extremities ? ? ?Subjective:  ? ? Patient ID: James Hartman, male    DOB: 1976/08/02, 46 y.o.   MRN: [id number] ? ?HPI ? ?  ?James Hartman is a 46 y.o. male with nearly a year long history of lower extremity weakness with numbness in his legs then problems with urinary fecal incontinence status post EMG showing L5 radiculopathy, MRI and then CT scan showing extensive lymphadenopathy but also lit up on PET scan as well as several bones that had heterogeneous bone marrow features on MRI and CT.  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 ? ?He presented to the ER with fevers to 104 and encephalopathy with an MRI showing extensive abnormal white matter disease consistent with potentially sarcoidosis versus infectious etiology.  CSF showed a monocytic lymphocytic predominant pleocytosis that was fairly mild with negative Gram stain and culture. ? ?I saw him using telehealth feature while he was in the ICU at [facility]. ? ?I organize a repeat lumbar puncture as mentioned with large-volume CSF taken. ? ?CSF was negative for MTB by PCR Histoplasma Blastomyces by antigen testing on PCR as well as on urine his cryptococcal antigen was negative and serum and on CSF. ? ?His fungal cultures from CSF were held for at least 4 weeks and were with no growth. ? ?I also had his lymph node from his right axilla sent to [facility] for PCR testing and it was negative for nontuberculous mycobacteria tuberculous mycobacteria, fungi bacteria as well as unusual organism such as Bartonella and T whippelei. ? ?Fungal cultures and AFB cultures on CSF are final and without growth. ? ? ?Seen by hematology for follow-up and been placed on prednisone at 60 mg a day.  He was  still is having significant lower extremity weakness and in fact fell to the 2 days and took quite a bit  of time to get back up again. ? ?He has been referred to sarcoidosis experts at [facility] . ? ?Still has not been seen though he said he has called the clinic there and they said they have a backup for several months. ? ?I had increased his prednisone to 80 mg/day but he is still having worsening weakness and had 1 other fall while trying to get up on a step. ? ?I have put in an urgent referral to Dr. Clelia Croft at [facility]. ? ?I think it is critical that James Hartman be seen as soon as possible given his worsening neurological condition and given the fact that I am certainly not a person capable of managing neurosarcoidosis. ? ? ? ? ? ?Past Medical History:  ?Diagnosis Date  ? Hypertension   ? Sleep apnea   ? ? ?Past Surgical History:  ?Procedure Laterality Date  ? AXILLARY LYMPH NODE BIOPSY Right 11/16/2021  ? Procedure: AXILLARY LYMPH NODE BIOPSY;  Surgeon: Franky Macho, MD;  Location: [facility];  Service: General;  Laterality: Right;  ? ? ?Family History  ?Problem Relation Age of Onset  ? Cancer Mother   ? Hypertension Mother   ? Heart disease Father   ? Prostate cancer Maternal Grandfather   ? Breast cancer Maternal Aunt   ? Colon cancer Neg Hx   ? Colon polyps Neg Hx   ? ? ?  ?Social History  ? ?Socioeconomic History  ? Marital status: Single  ?  Spouse name:  Not on file  ? Number of children: Not on file  ? Years of education: Not on file  ? Highest education level: Not on file  ?Occupational History  ? Not on file  ?Tobacco Use  ? Smoking status: Former  ? Smokeless tobacco: Never  ?Vaping Use  ? Vaping Use: Never used  ?Substance and Sexual Activity  ? Alcohol use: Not Currently  ? Drug use: No  ? Sexual activity: Not Currently  ?Other Topics Concern  ? Not on file  ?Social History Narrative  ? Not on file  ? ?Social Determinants of Health  ? ?Financial Resource Strain: Not on file  ?Food Insecurity: Not on file  ?Transportation Needs: Not on file  ?Physical Activity: Not on file   ?Stress: Not on file  ?Social Connections: Not on file  ? ? ?No Known Allergies ? ? ?Current Outpatient Medications:  ?  albuterol (VENTOLIN HFA) 108 (90 Base) MCG/ACT inhaler, Inhale 1-2 puffs into the lungs every 6 (six) hours as needed for wheezing or shortness of breath., Disp: , Rfl:  ?  DULoxetine (CYMBALTA) 60 MG capsule, Take 60 mg by mouth 2 (two) times daily., Disp: , Rfl:  ?  fluticasone (FLONASE) 50 MCG/ACT nasal spray, Place 1 spray into both nostrils daily as needed for allergies or rhinitis., Disp: , Rfl:  ?  folic acid (FOLVITE) 1 MG tablet, Take 1 tablet (1 mg total) by mouth daily., Disp: 30 tablet, Rfl: 2 ?  furosemide (LASIX) 20 MG tablet, Take 1 tablet (20 mg total) by mouth daily as needed., Disp: 30 tablet, Rfl: 1 ?  hydrochlorothiazide (HYDRODIURIL) 12.5 MG tablet, Take 1 tablet (12.5 mg total) by mouth daily., Disp: 30 tablet, Rfl: 2 ?  HYDROcodone-acetaminophen (NORCO) 5-325 MG tablet, Take 1 tablet by mouth every 4 (four) hours as needed for moderate pain., Disp: 20 tablet, Rfl: 0 ?  polyethylene glycol (MIRALAX / GLYCOLAX) 17 g packet, Take 17 g by mouth daily., Disp: 30 each, Rfl: 2 ?  pravastatin (PRAVACHOL) 40 MG tablet, Take 40 mg by mouth daily., Disp: , Rfl:  ?  predniSONE (DELTASONE) 20 MG tablet, Take 4 tablets (80 mg total) by mouth daily with breakfast., Disp: 120 tablet, Rfl: 1 ?  valsartan (DIOVAN) 80 MG tablet, Take 1 tablet (80 mg total) by mouth daily., Disp: 30 tablet, Rfl: 2 ? ? ?Review of Systems  ?Constitutional:  Negative for activity change, appetite change, chills, diaphoresis, fatigue, fever and unexpected weight change.  ?HENT:  Negative for congestion, rhinorrhea, sinus pressure, sneezing, sore throat and trouble swallowing.   ?Eyes:  Negative for photophobia and visual disturbance.  ?Respiratory:  Negative for cough, chest tightness, shortness of breath, wheezing and stridor.   ?Cardiovascular:  Negative for chest pain, palpitations and leg swelling.   ?Gastrointestinal:  Negative for abdominal distention, abdominal pain, anal bleeding, blood in stool, constipation, diarrhea, nausea and vomiting.  ?Genitourinary:  Negative for difficulty urinating, dysuria, flank pain and hematuria.  ?Musculoskeletal:  Positive for arthralgias. Negative for back pain, gait problem, joint swelling and myalgias.  ?Skin:  Negative for color change, pallor, rash and wound.  ?Neurological:  Positive for weakness and numbness. Negative for dizziness, tremors and light-headedness.  ?Hematological:  Negative for adenopathy. Does not bruise/bleed easily.  ?Psychiatric/Behavioral:  Negative for agitation, behavioral problems, confusion, decreased concentration, dysphoric mood and sleep disturbance.   ? ?   ?Objective:  ? Physical Exam ?Constitutional:   ?   Appearance: He is well-developed.  ?HENT:  ?  Head: Normocephalic and atraumatic.  ?Eyes:  ?   Conjunctiva/sclera: Conjunctivae normal.  ?Cardiovascular:  ?   Rate and Rhythm: Normal rate and regular rhythm.  ?Pulmonary:  ?   Effort: Pulmonary effort is normal. No respiratory distress.  ?   Breath sounds: No wheezing.  ?Abdominal:  ?   General: There is no distension.  ?   Palpations: Abdomen is soft.  ?Musculoskeletal:     ?   General: No tenderness. Normal range of motion.  ?   Cervical back: Normal range of motion and neck supple.  ?Skin: ?   General: Skin is warm and dry.  ?   Coloration: Skin is not pale.  ?   Findings: No erythema or rash.  ?Neurological:  ?   Mental Status: He is alert and oriented to person, place, and time.  ?   Comments: Weaker strenght symmetrical proximal thigh  ?Psychiatric:     ?   Mood and Affect: Mood normal.     ?   Behavior: Behavior normal.     ?   Thought Content: Thought content normal.     ?   Judgment: Judgment normal.  ? ? ? ? ? ?   ?Assessment & Plan:  ?Extensive sarcoidosis including neurosarcoidosis: ? ?He has not improved on the higher dose of prednisone of 80/day that I placed him on. ? ?He  is still not been seen by a specialist at tertiary care center. ? ?I found to [facility] who is an expert in neurosarcoidosis and I am referring him urgently to her. ? ?The meanti

## 2022-01-20 LAB — MTB-RIF NAA W AFB CULT, NON-SPUTUM: Acid Fast Culture: NEGATIVE

## 2022-01-29 ENCOUNTER — Ambulatory Visit (HOSPITAL_COMMUNITY)
Admission: RE | Admit: 2022-01-29 | Discharge: 2022-01-29 | Disposition: A | Discharge status: Home / Self Care | Payer: 59 | Source: Ambulatory Visit | Attending: Family Medicine | Admitting: Family Medicine

## 2022-01-29 DIAGNOSIS — R600 Localized edema: Secondary | ICD-10-CM | POA: Diagnosis present

## 2022-01-29 LAB — ECHOCARDIOGRAM COMPLETE
Area-P 1/2: 3.48 cm2
S' Lateral: 2.3 cm

## 2022-01-29 NOTE — Progress Notes (Signed)
*  PRELIMINARY RESULTS* Echocardiogram 2D Echocardiogram has been performed.  Stacey Drain 01/29/2022, 9:20 AM

## 2022-02-11 ENCOUNTER — Ambulatory Visit (HOSPITAL_COMMUNITY): Payer: No Typology Code available for payment source

## 2022-02-22 ENCOUNTER — Ambulatory Visit (HOSPITAL_COMMUNITY): Payer: No Typology Code available for payment source | Attending: Family Medicine

## 2022-02-26 ENCOUNTER — Emergency Department (HOSPITAL_COMMUNITY): Payer: No Typology Code available for payment source | Admitting: Certified Registered"

## 2022-02-26 ENCOUNTER — Emergency Department (HOSPITAL_COMMUNITY): Payer: No Typology Code available for payment source

## 2022-02-26 ENCOUNTER — Other Ambulatory Visit: Payer: Self-pay

## 2022-02-26 ENCOUNTER — Inpatient Hospital Stay (HOSPITAL_COMMUNITY)
Admission: EM | Admit: 2022-02-26 | Discharge: 2022-03-08 | DRG: 270 | Disposition: A | Discharge status: Home Health | Payer: No Typology Code available for payment source | Attending: Internal Medicine | Admitting: Internal Medicine

## 2022-02-26 ENCOUNTER — Encounter (HOSPITAL_COMMUNITY): Payer: Self-pay

## 2022-02-26 ENCOUNTER — Emergency Department (EMERGENCY_DEPARTMENT_HOSPITAL): Payer: No Typology Code available for payment source | Admitting: Certified Registered"

## 2022-02-26 ENCOUNTER — Encounter (HOSPITAL_COMMUNITY)
Admission: EM | Disposition: A | Discharge status: Home Health | Payer: Self-pay | Source: Home / Self Care | Attending: Internal Medicine

## 2022-02-26 DIAGNOSIS — I82409 Acute embolism and thrombosis of unspecified deep veins of unspecified lower extremity: Secondary | ICD-10-CM

## 2022-02-26 DIAGNOSIS — D72829 Elevated white blood cell count, unspecified: Secondary | ICD-10-CM | POA: Diagnosis present

## 2022-02-26 DIAGNOSIS — D71 Functional disorders of polymorphonuclear neutrophils: Secondary | ICD-10-CM | POA: Diagnosis present

## 2022-02-26 DIAGNOSIS — I5033 Acute on chronic diastolic (congestive) heart failure: Secondary | ICD-10-CM | POA: Diagnosis not present

## 2022-02-26 DIAGNOSIS — B952 Enterococcus as the cause of diseases classified elsewhere: Secondary | ICD-10-CM | POA: Diagnosis present

## 2022-02-26 DIAGNOSIS — Z9989 Dependence on other enabling machines and devices: Secondary | ICD-10-CM | POA: Diagnosis not present

## 2022-02-26 DIAGNOSIS — N170 Acute kidney failure with tubular necrosis: Secondary | ICD-10-CM | POA: Diagnosis not present

## 2022-02-26 DIAGNOSIS — I888 Other nonspecific lymphadenitis: Secondary | ICD-10-CM | POA: Diagnosis not present

## 2022-02-26 DIAGNOSIS — N390 Urinary tract infection, site not specified: Secondary | ICD-10-CM | POA: Diagnosis present

## 2022-02-26 DIAGNOSIS — E1140 Type 2 diabetes mellitus with diabetic neuropathy, unspecified: Secondary | ICD-10-CM | POA: Diagnosis present

## 2022-02-26 DIAGNOSIS — Z8249 Family history of ischemic heart disease and other diseases of the circulatory system: Secondary | ICD-10-CM | POA: Diagnosis not present

## 2022-02-26 DIAGNOSIS — E1165 Type 2 diabetes mellitus with hyperglycemia: Secondary | ICD-10-CM | POA: Diagnosis present

## 2022-02-26 DIAGNOSIS — R000 Tachycardia, unspecified: Secondary | ICD-10-CM | POA: Diagnosis not present

## 2022-02-26 DIAGNOSIS — J4540 Moderate persistent asthma, uncomplicated: Secondary | ICD-10-CM | POA: Diagnosis present

## 2022-02-26 DIAGNOSIS — Z6841 Body Mass Index (BMI) 40.0 and over, adult: Secondary | ICD-10-CM

## 2022-02-26 DIAGNOSIS — I110 Hypertensive heart disease with heart failure: Secondary | ICD-10-CM | POA: Diagnosis present

## 2022-02-26 DIAGNOSIS — I82451 Acute embolism and thrombosis of right peroneal vein: Secondary | ICD-10-CM | POA: Diagnosis present

## 2022-02-26 DIAGNOSIS — D649 Anemia, unspecified: Secondary | ICD-10-CM | POA: Diagnosis present

## 2022-02-26 DIAGNOSIS — D8689 Sarcoidosis of other sites: Secondary | ICD-10-CM | POA: Diagnosis present

## 2022-02-26 DIAGNOSIS — Z79899 Other long term (current) drug therapy: Secondary | ICD-10-CM

## 2022-02-26 DIAGNOSIS — E1151 Type 2 diabetes mellitus with diabetic peripheral angiopathy without gangrene: Principal | ICD-10-CM | POA: Diagnosis present

## 2022-02-26 DIAGNOSIS — I70222 Atherosclerosis of native arteries of extremities with rest pain, left leg: Secondary | ICD-10-CM | POA: Diagnosis present

## 2022-02-26 DIAGNOSIS — I998 Other disorder of circulatory system: Secondary | ICD-10-CM | POA: Diagnosis present

## 2022-02-26 DIAGNOSIS — T380X5A Adverse effect of glucocorticoids and synthetic analogues, initial encounter: Secondary | ICD-10-CM | POA: Diagnosis present

## 2022-02-26 DIAGNOSIS — F4321 Adjustment disorder with depressed mood: Secondary | ICD-10-CM | POA: Insufficient documentation

## 2022-02-26 DIAGNOSIS — Z28310 Unvaccinated for covid-19: Secondary | ICD-10-CM | POA: Diagnosis not present

## 2022-02-26 DIAGNOSIS — I10 Essential (primary) hypertension: Secondary | ICD-10-CM

## 2022-02-26 DIAGNOSIS — G4733 Obstructive sleep apnea (adult) (pediatric): Secondary | ICD-10-CM | POA: Diagnosis not present

## 2022-02-26 DIAGNOSIS — A419 Sepsis, unspecified organism: Secondary | ICD-10-CM | POA: Diagnosis not present

## 2022-02-26 DIAGNOSIS — E1169 Type 2 diabetes mellitus with other specified complication: Secondary | ICD-10-CM

## 2022-02-26 DIAGNOSIS — W19XXXA Unspecified fall, initial encounter: Secondary | ICD-10-CM | POA: Diagnosis not present

## 2022-02-26 DIAGNOSIS — R6521 Severe sepsis with septic shock: Secondary | ICD-10-CM | POA: Diagnosis not present

## 2022-02-26 DIAGNOSIS — I9581 Postprocedural hypotension: Secondary | ICD-10-CM | POA: Diagnosis not present

## 2022-02-26 DIAGNOSIS — E782 Mixed hyperlipidemia: Secondary | ICD-10-CM | POA: Diagnosis present

## 2022-02-26 DIAGNOSIS — Z7952 Long term (current) use of systemic steroids: Secondary | ICD-10-CM

## 2022-02-26 DIAGNOSIS — R5381 Other malaise: Secondary | ICD-10-CM | POA: Diagnosis not present

## 2022-02-26 DIAGNOSIS — Y92239 Unspecified place in hospital as the place of occurrence of the external cause: Secondary | ICD-10-CM | POA: Diagnosis present

## 2022-02-26 DIAGNOSIS — I2699 Other pulmonary embolism without acute cor pulmonale: Secondary | ICD-10-CM | POA: Diagnosis present

## 2022-02-26 DIAGNOSIS — Z87891 Personal history of nicotine dependence: Secondary | ICD-10-CM | POA: Diagnosis not present

## 2022-02-26 DIAGNOSIS — I743 Embolism and thrombosis of arteries of the lower extremities: Secondary | ICD-10-CM | POA: Diagnosis present

## 2022-02-26 DIAGNOSIS — Z803 Family history of malignant neoplasm of breast: Secondary | ICD-10-CM

## 2022-02-26 DIAGNOSIS — E119 Type 2 diabetes mellitus without complications: Secondary | ICD-10-CM

## 2022-02-26 DIAGNOSIS — N179 Acute kidney failure, unspecified: Secondary | ICD-10-CM

## 2022-02-26 DIAGNOSIS — I889 Nonspecific lymphadenitis, unspecified: Secondary | ICD-10-CM | POA: Diagnosis present

## 2022-02-26 DIAGNOSIS — I745 Embolism and thrombosis of iliac artery: Secondary | ICD-10-CM | POA: Diagnosis present

## 2022-02-26 DIAGNOSIS — R7989 Other specified abnormal findings of blood chemistry: Secondary | ICD-10-CM | POA: Diagnosis present

## 2022-02-26 DIAGNOSIS — Z7984 Long term (current) use of oral hypoglycemic drugs: Secondary | ICD-10-CM

## 2022-02-26 DIAGNOSIS — Z8042 Family history of malignant neoplasm of prostate: Secondary | ICD-10-CM

## 2022-02-26 HISTORY — DX: Type 2 diabetes mellitus without complications: E11.9

## 2022-02-26 HISTORY — PX: THROMBECTOMY ILIAC ARTERY: SHX6405

## 2022-02-26 HISTORY — PX: THROMBECTOMY FEMORAL ARTERY: SHX6406

## 2022-02-26 HISTORY — PX: INTRAOPERATIVE ARTERIOGRAM: SHX5157

## 2022-02-26 LAB — POCT I-STAT 7, (LYTES, BLD GAS, ICA,H+H)
Acid-Base Excess: 5 mmol/L — ABNORMAL HIGH (ref 0.0–2.0)
Acid-Base Excess: 4 mmol/L — ABNORMAL HIGH (ref 0.0–2.0)
Bicarbonate: 30.6 mmol/L — ABNORMAL HIGH (ref 20.0–28.0)
Bicarbonate: 28.5 mmol/L — ABNORMAL HIGH (ref 20.0–28.0)
Calcium, Ion: 1.18 mmol/L (ref 1.15–1.40)
Calcium, Ion: 1.15 mmol/L (ref 1.15–1.40)
HCT: 29 % — ABNORMAL LOW (ref 39.0–52.0)
HCT: 31 % — ABNORMAL LOW (ref 39.0–52.0)
Hemoglobin: 9.9 g/dL — ABNORMAL LOW (ref 13.0–17.0)
Hemoglobin: 10.5 g/dL — ABNORMAL LOW (ref 13.0–17.0)
O2 Saturation: 100 %
O2 Saturation: 99 %
Patient temperature: 37
Patient temperature: 36.8
Potassium: 4.3 mmol/L (ref 3.5–5.1)
Potassium: 4 mmol/L (ref 3.5–5.1)
Sodium: 134 mmol/L — ABNORMAL LOW (ref 135–145)
Sodium: 136 mmol/L (ref 135–145)
TCO2: 32 mmol/L (ref 22–32)
TCO2: 30 mmol/L (ref 22–32)
pCO2 arterial: 50.6 mmHg — ABNORMAL HIGH (ref 32–48)
pCO2 arterial: 41.2 mmHg (ref 32–48)
pH, Arterial: 7.39 (ref 7.35–7.45)
pH, Arterial: 7.448 (ref 7.35–7.45)
pO2, Arterial: 193 mmHg — ABNORMAL HIGH (ref 83–108)
pO2, Arterial: 140 mmHg — ABNORMAL HIGH (ref 83–108)

## 2022-02-26 LAB — CBC WITH DIFFERENTIAL/PLATELET
Abs Immature Granulocytes: 0.11 10*3/uL — ABNORMAL HIGH (ref 0.00–0.07)
Basophils Absolute: 0 10*3/uL (ref 0.0–0.1)
Basophils Relative: 0 %
Eosinophils Absolute: 0 10*3/uL (ref 0.0–0.5)
Eosinophils Relative: 0 %
HCT: 42.3 % (ref 39.0–52.0)
Hemoglobin: 13.7 g/dL (ref 13.0–17.0)
Immature Granulocytes: 1 %
Lymphocytes Relative: 6 %
Lymphs Abs: 0.9 10*3/uL (ref 0.7–4.0)
MCH: 29.3 pg (ref 26.0–34.0)
MCHC: 32.4 g/dL (ref 30.0–36.0)
MCV: 90.4 fL (ref 80.0–100.0)
Monocytes Absolute: 0.8 10*3/uL (ref 0.1–1.0)
Monocytes Relative: 5 %
Neutro Abs: 14.3 10*3/uL — ABNORMAL HIGH (ref 1.7–7.7)
Neutrophils Relative %: 88 %
Platelets: 214 10*3/uL (ref 150–400)
RBC: 4.68 MIL/uL (ref 4.22–5.81)
RDW: 13.7 % (ref 11.5–15.5)
WBC: 16.2 10*3/uL — ABNORMAL HIGH (ref 4.0–10.5)
nRBC: 0.1 % (ref 0.0–0.2)

## 2022-02-26 LAB — COMPREHENSIVE METABOLIC PANEL
ALT: 39 U/L (ref 0–44)
AST: 25 U/L (ref 15–41)
Albumin: 3.4 g/dL — ABNORMAL LOW (ref 3.5–5.0)
Alkaline Phosphatase: 120 U/L (ref 38–126)
Anion gap: 10 (ref 5–15)
BUN: 20 mg/dL (ref 6–20)
CO2: 29 mmol/L (ref 22–32)
Calcium: 9.5 mg/dL (ref 8.9–10.3)
Chloride: 98 mmol/L (ref 98–111)
Creatinine, Ser: 1.11 mg/dL (ref 0.61–1.24)
GFR, Estimated: >60 mL/min (ref >60)
Glucose, Bld: 307 mg/dL — ABNORMAL HIGH (ref 70–99)
Potassium: 5 mmol/L (ref 3.5–5.1)
Sodium: 137 mmol/L (ref 135–145)
Total Bilirubin: 0.8 mg/dL (ref 0.3–1.2)
Total Protein: 7.7 g/dL (ref 6.5–8.1)

## 2022-02-26 LAB — HEPARIN LEVEL (UNFRACTIONATED): Heparin Unfractionated: 0.8 IU/mL — ABNORMAL HIGH (ref 0.30–0.70)

## 2022-02-26 LAB — TROPONIN I (HIGH SENSITIVITY)
Troponin I (High Sensitivity): 21 ng/L — ABNORMAL HIGH (ref <18)
Troponin I (High Sensitivity): 14 ng/L (ref <18)

## 2022-02-26 LAB — RAPID URINE DRUG SCREEN, HOSP PERFORMED
Amphetamines: NOT DETECTED
Barbiturates: NOT DETECTED
Benzodiazepines: POSITIVE — AB
Cocaine: NOT DETECTED
Opiates: NOT DETECTED
Tetrahydrocannabinol: NOT DETECTED

## 2022-02-26 LAB — TYPE AND SCREEN
ABO/RH(D): A POS
Antibody Screen: NEGATIVE

## 2022-02-26 LAB — GLUCOSE, CAPILLARY
Glucose-Capillary: 228 mg/dL — ABNORMAL HIGH (ref 70–99)
Glucose-Capillary: 197 mg/dL — ABNORMAL HIGH (ref 70–99)
Glucose-Capillary: 216 mg/dL — ABNORMAL HIGH (ref 70–99)
Glucose-Capillary: 224 mg/dL — ABNORMAL HIGH (ref 70–99)

## 2022-02-26 LAB — BRAIN NATRIURETIC PEPTIDE: B Natriuretic Peptide: 24 pg/mL (ref 0.0–100.0)

## 2022-02-26 LAB — POCT ACTIVATED CLOTTING TIME
Activated Clotting Time: 305 seconds
Activated Clotting Time: 251 seconds
Activated Clotting Time: 269 seconds

## 2022-02-26 LAB — ABO/RH: ABO/RH(D): A POS

## 2022-02-26 LAB — HEMOGLOBIN A1C
Hgb A1c MFr Bld: 10.4 % — ABNORMAL HIGH (ref 4.8–5.6)
Mean Plasma Glucose: 251.78 mg/dL

## 2022-02-26 LAB — TSH: TSH: 0.723 u[IU]/mL (ref 0.350–4.500)

## 2022-02-26 IMAGING — US US EXTREM LOW VENOUS
1 series · 13 of 24 positions shown · non-contrast
Comparison: None Available.

CLINICAL DATA: BILATERAL leg pain



[Series 1: us venous img lower bilat (dvt) · portal-venous · 13 of 87 slices shown]
[im 1/87]
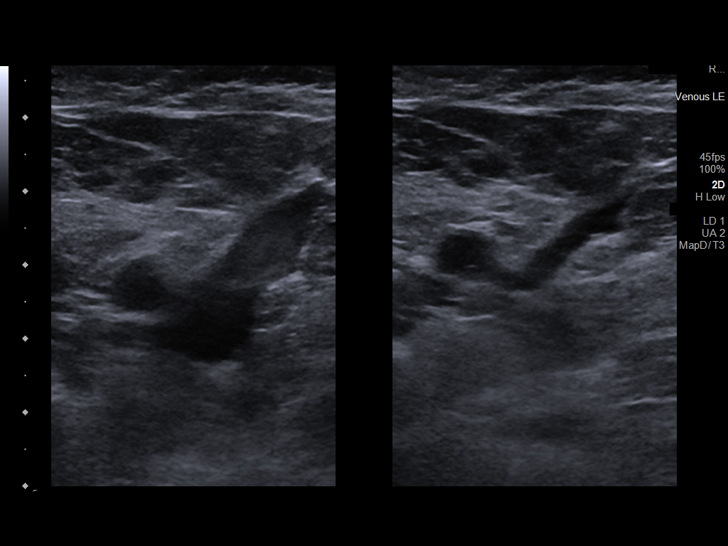
[im 8/87]
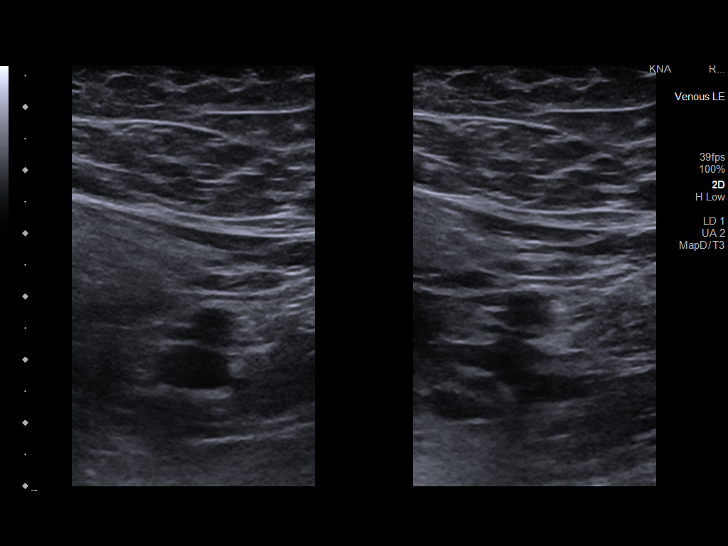
[im 15/87]
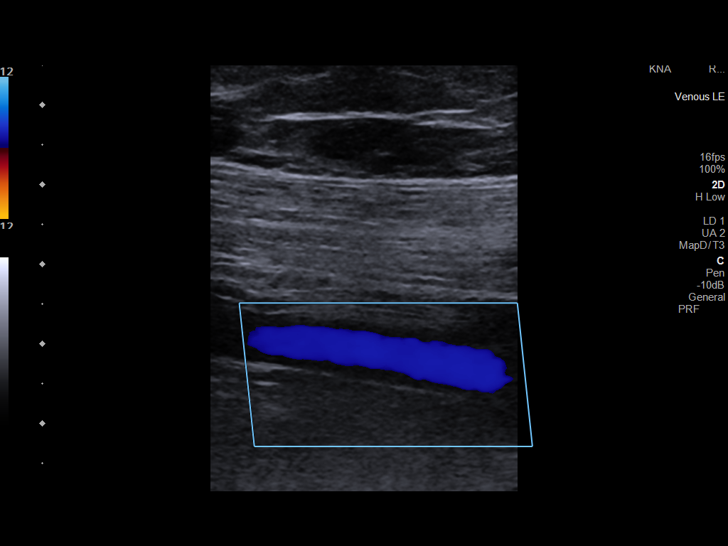
[im 23/87]
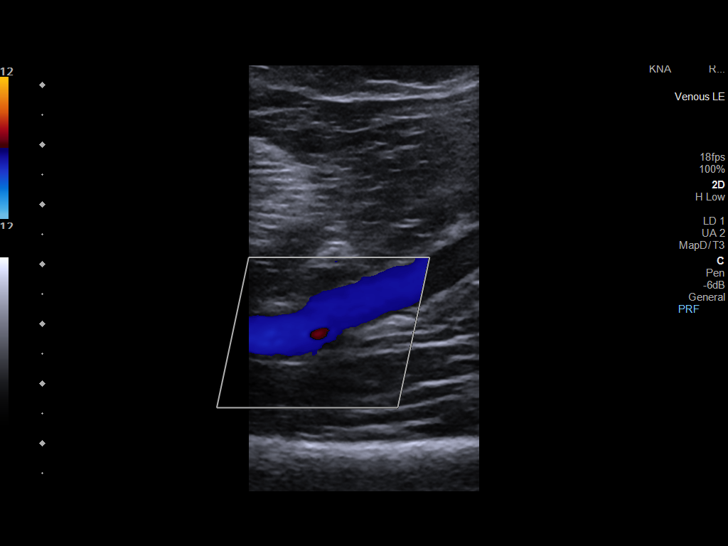
[im 30/87]
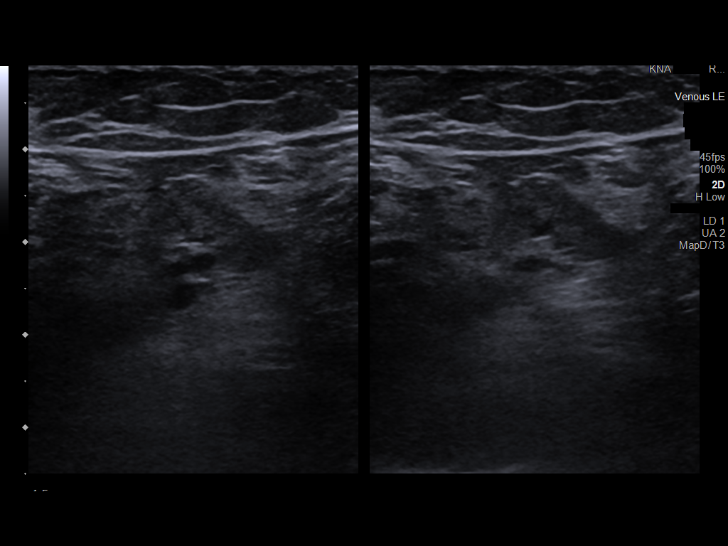
[im 38/87]
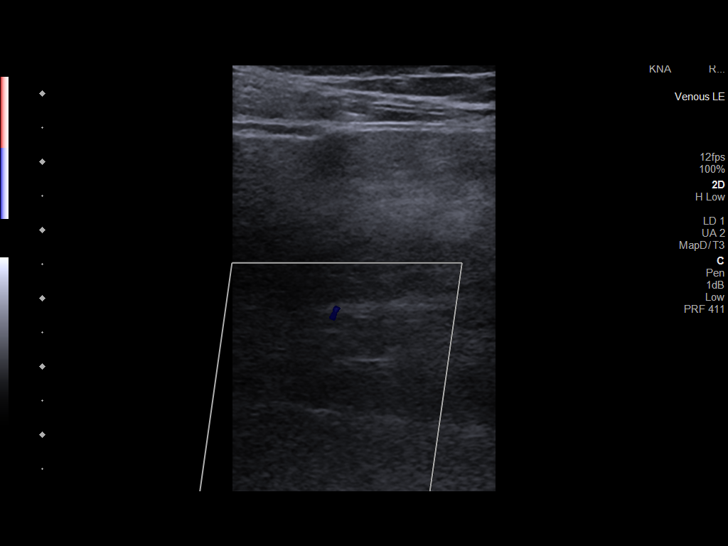
[im 45/87]
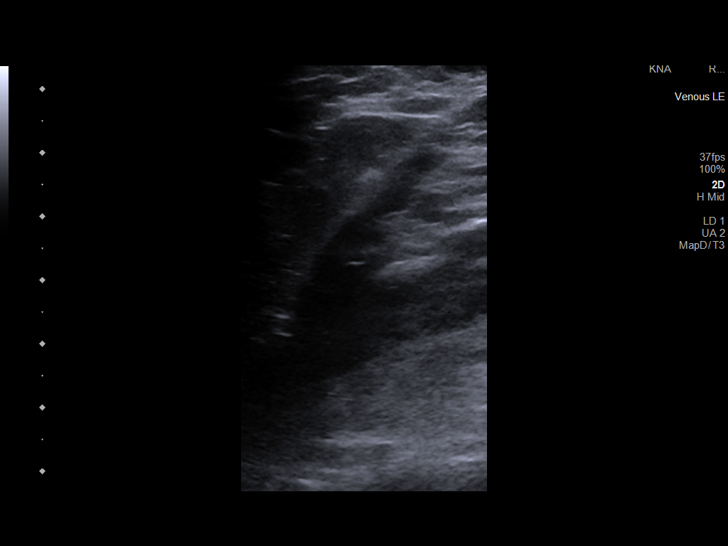
[im 49/87]
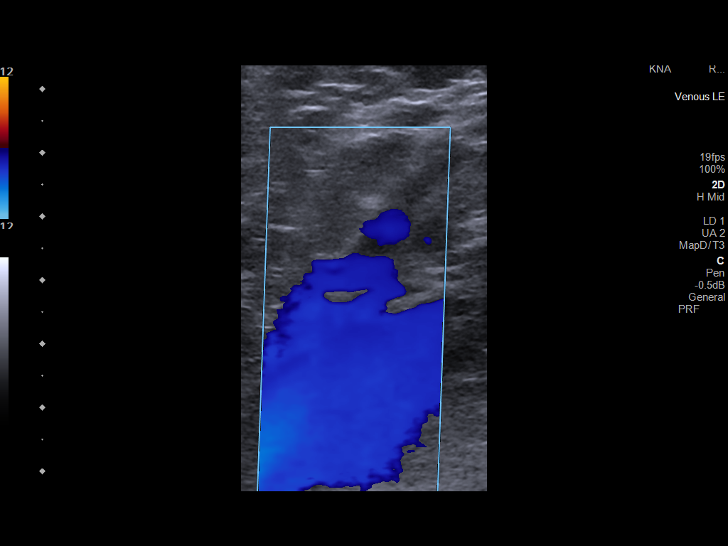
[im 57/87]
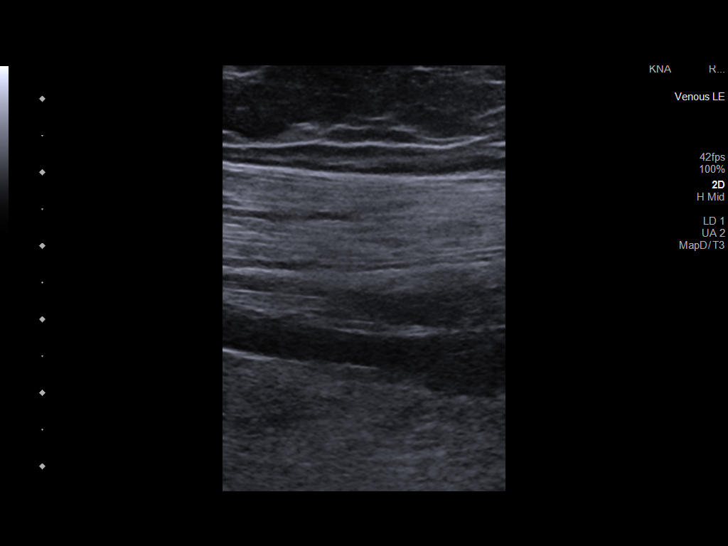
[im 64/87]
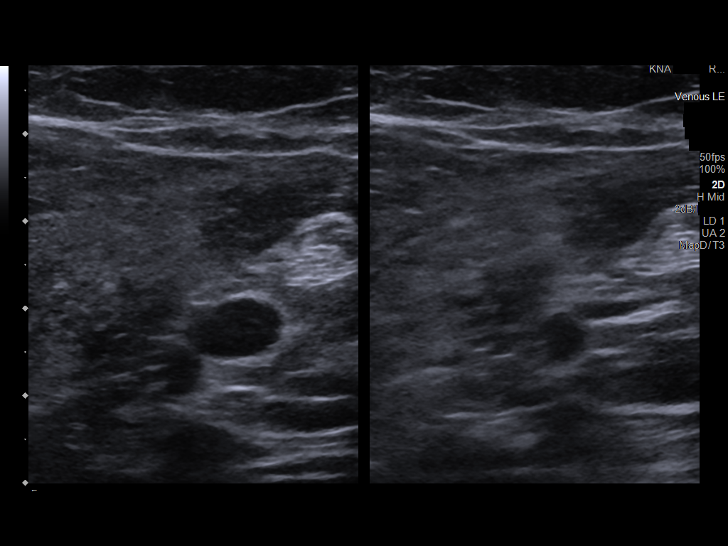
[im 72/87]
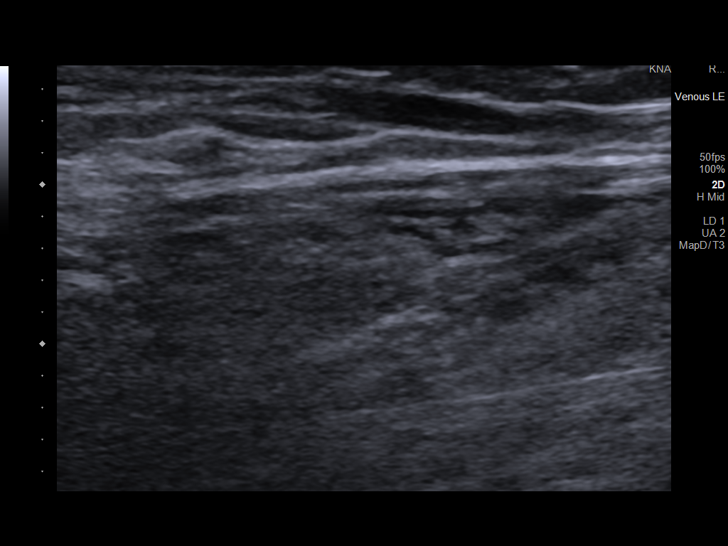
[im 79/87]
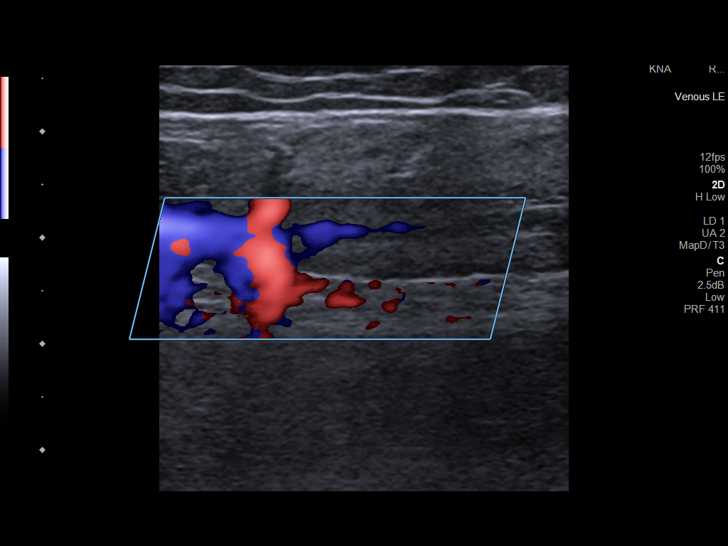
[im 87/87]
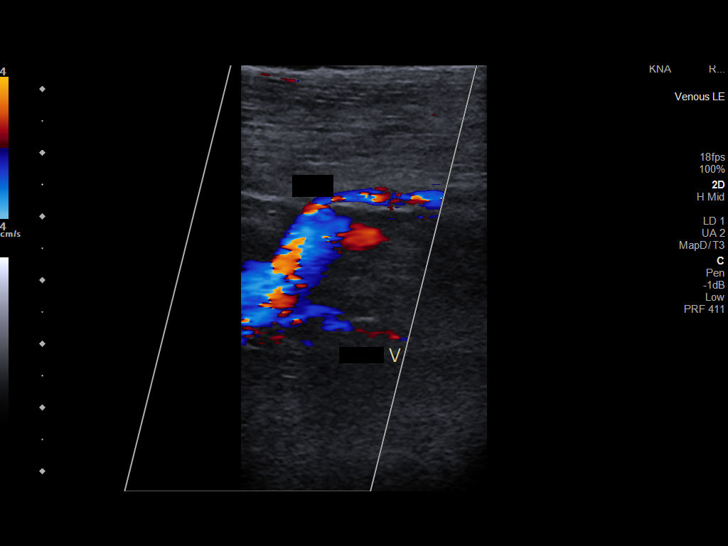

[13 of 24 positions shown; findings below may reference images not displayed]

FINDINGS: RIGHT LOWER EXTREMITY

Common Femoral Vein: No evidence of thrombus. Normal
compressibility, respiratory phasicity and response to augmentation.

Saphenofemoral Junction: No evidence of thrombus. Normal
compressibility and flow on color Doppler imaging.

Profunda Femoral Vein: No evidence of thrombus. Normal
compressibility and flow on color Doppler imaging.

Femoral Vein: No evidence of thrombus. Normal compressibility,
respiratory phasicity and response to augmentation.

Popliteal Vein: No evidence of thrombus. Normal compressibility,
respiratory phasicity and response to augmentation.

Calf Veins: Posterior tibial veins appear patent. Anterior tibial
veins not visualized. Peroneal veins are suboptimally seen though a
segment of hypoechoic internal echogenicity is identified within a
peroneal vein with impaired spontaneous venous flow on color Doppler
imaging consistent with deep vein thrombosis.

Superficial Great Saphenous Vein: No evidence of thrombus. Normal
compressibility.

Venous Reflux:  None.

Other Findings:  None.

LEFT LOWER EXTREMITY

Common Femoral Vein: No evidence of thrombus. Normal
compressibility, respiratory phasicity and response to augmentation.

Saphenofemoral Junction: No evidence of thrombus. Normal
compressibility and flow on color Doppler imaging.

Profunda Femoral Vein: No evidence of thrombus. Normal
compressibility and flow on color Doppler imaging.

Femoral Vein: No evidence of thrombus. Normal compressibility,
respiratory phasicity and response to augmentation.

Popliteal Vein: No evidence of thrombus. Normal compressibility,
respiratory phasicity and response to augmentation.

Calf Veins: No evidence of thrombus. Normal compressibility and flow
on color Doppler imaging.

Superficial Great Saphenous Vein: No evidence of thrombus. Normal
compressibility.

Venous Reflux:  None.

Other Findings:  None.
IMPRESSION: Positive exam for presence of deep venous thrombosis within RIGHT
peroneal vein.

No evidence of deep venous thrombosis within the LEFT lower
extremity or at/above the RIGHT knee.

## 2022-02-26 IMAGING — CT CT ANGIO AOBIFEM WO/W CM
2 of 18 series · 11 of 48 positions shown, 16 images · IV contrast (Omnipaque or Isovue)
Comparison: None available

CLINICAL DATA: Bilateral leg and foot pain and numbness for 3 days.
TECHNIQUE: Multidetector CT imaging of the abdomen, pelvis and lower
extremities was performed using the standard protocol during bolus
administration of intravenous contrast. Multiplanar CT image
reconstructions and MIPs were obtained to evaluate the vascular
anatomy.

[Series 16: arterial · axial · arterial · 0.98mm/px · z∈[-121,+719]mm · 6 of 392 slices shown, 11 images (1 of 2)]
[im 56/392  soft-tissue]
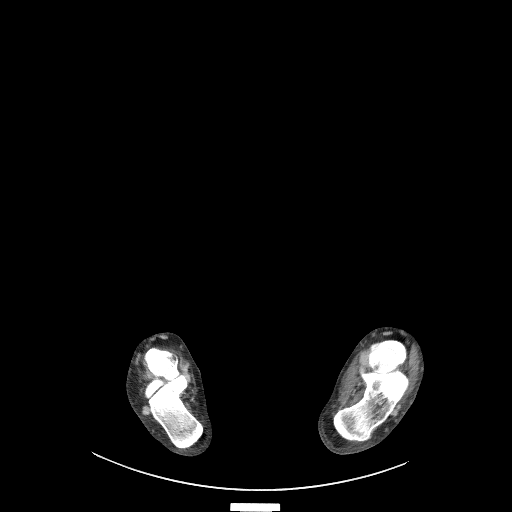
[im 56/392  bone]
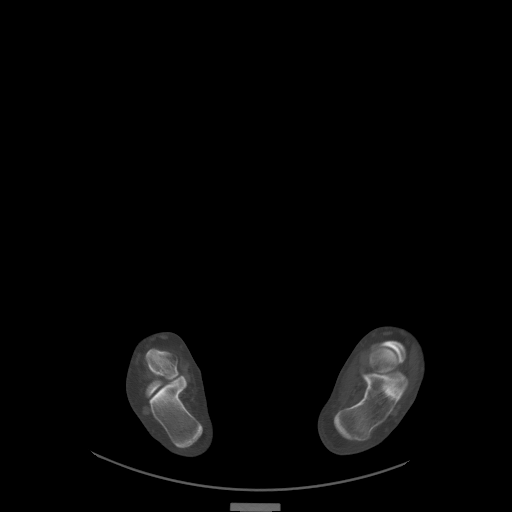
[im 112/392  soft-tissue]
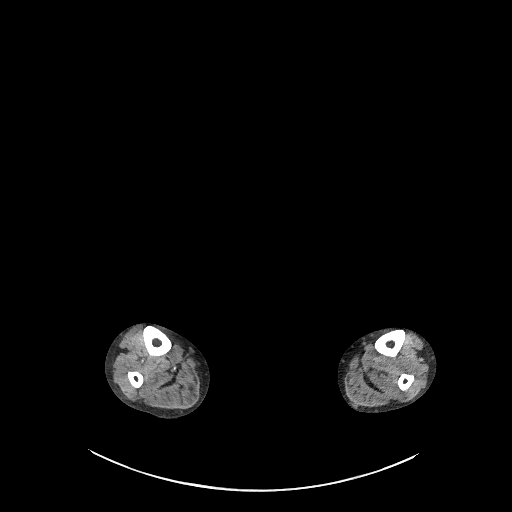
[im 168/392  soft-tissue]
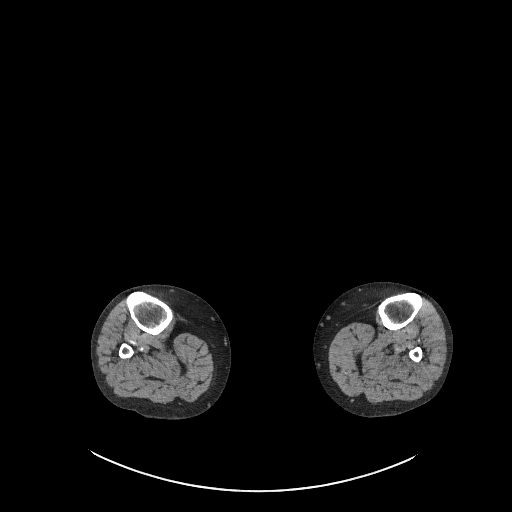
[im 168/392  lung]
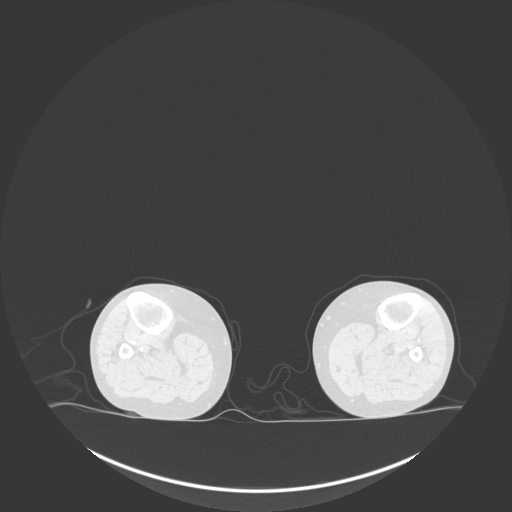
[im 224/392  soft-tissue]
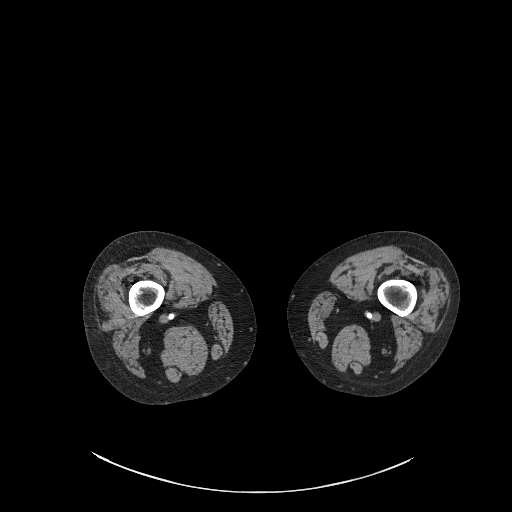
[im 224/392  lung]
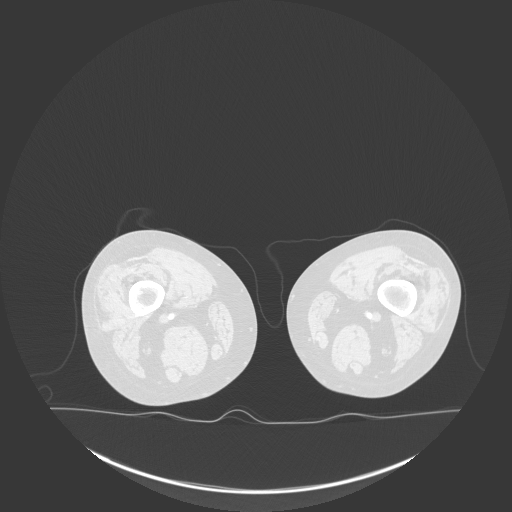
[im 280/392  soft-tissue]
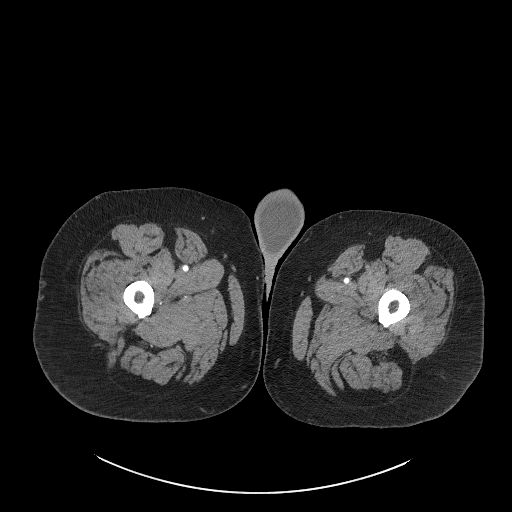
[im 280/392  lung]
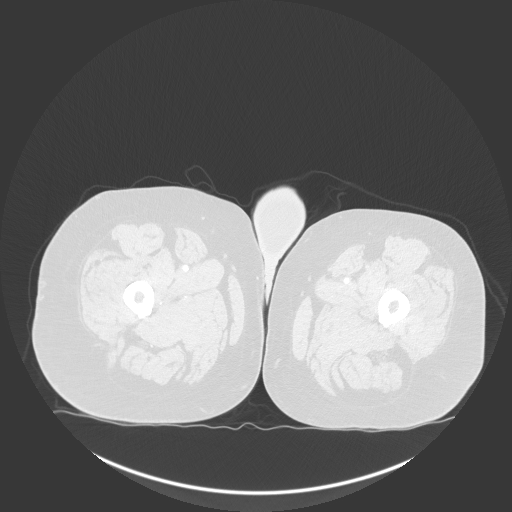
[im 336/392  soft-tissue]
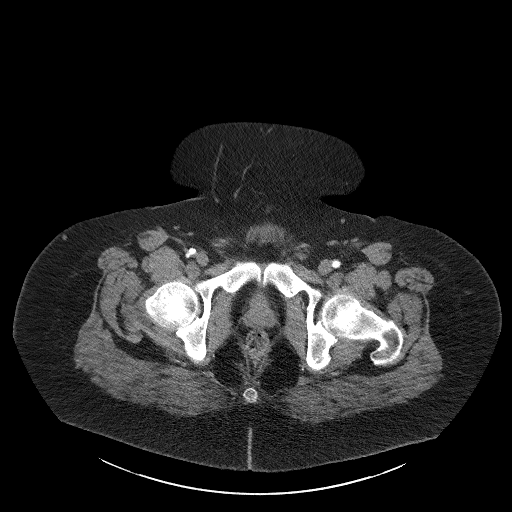
[im 336/392  lung]
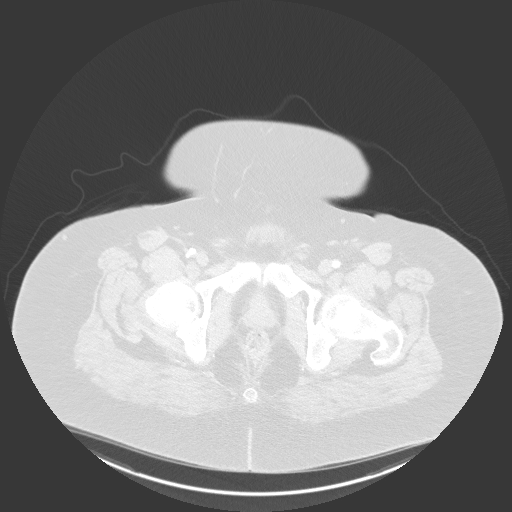

[Series 24: arterial · axial · arterial · 0.98mm/px · z∈[-112,+560]mm · 5 of 394 slices shown (2 of 2)]
[im 57/394  soft-tissue]
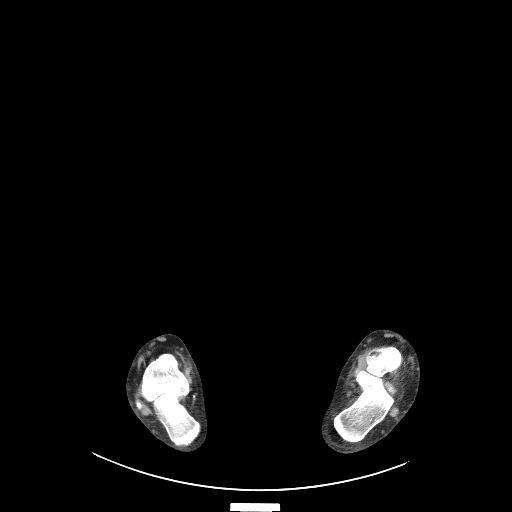
[im 113/394  soft-tissue]
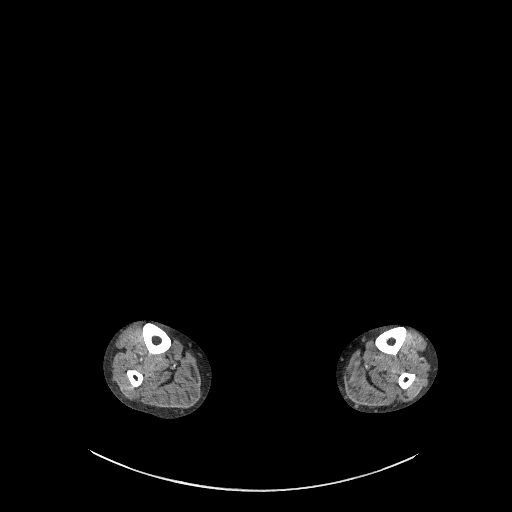
[im 169/394  soft-tissue]
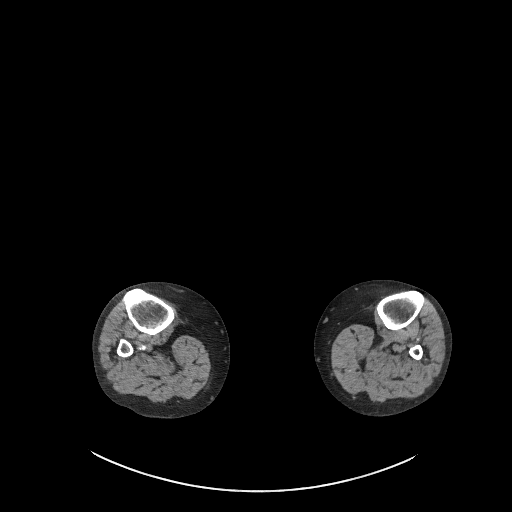
[im 225/394  soft-tissue]
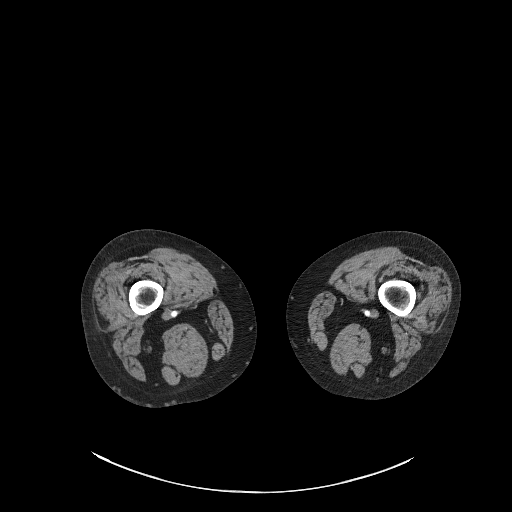
[im 281/394  soft-tissue]
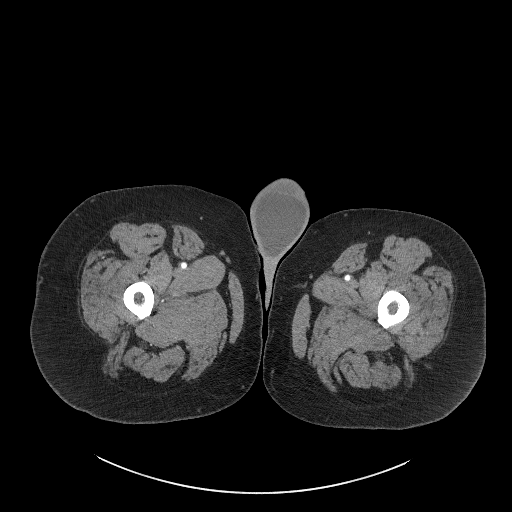

[11 of 48 positions shown; findings below may reference images not displayed]

Patient states he is normally ambulatory with cane but has been
unable to ambulate due to pain in bilateral legs and feet. Pt having
involuntary twitching of legs/unable to control, included second
lower extremity images incase there was movement.

EXAM:
CT ANGIOGRAPHY OF ABDOMINAL AORTA WITH ILIOFEMORAL RUNOFF
RADIATION DOSE REDUCTION: This exam was performed according to the
departmental dose-optimization program which includes automated
exposure control, adjustment of the mA and/or kV according to
patient size and/or use of iterative reconstruction technique.

CONTRAST:  125mL OMNIPAQUE IOHEXOL 350 MG/ML SOLN
FINDINGS: VASCULAR

Aorta: Minimal noncalcified and calcified plaque of the infrarenal
aorta without aneurysm or flow-limiting stenosis.

Celiac: Patent without evidence of aneurysm, dissection, vasculitis
or significant stenosis.

SMA: Patent without evidence of aneurysm, dissection, vasculitis or
significant stenosis.

Renals: Both renal arteries are patent without evidence of aneurysm,
dissection, vasculitis, fibromuscular dysplasia or significant
stenosis.

IMA: Patent.

RIGHT Lower Extremity

Inflow: Approximately 75% stenosis on the right common iliac artery
due to eccentric intraluminal filling defect which is suspicious for
embolus. No significant narrowing of the internal or external iliac
arteries.

Outflow: No significant stenosis of the Profunda femoris or common
femoral arteries. 50-25% stenosis of the distal superficial femoral
artery due to atheromatous plaque. No significant narrowing of the
popliteal artery.

Runoff: Patent three vessel runoff to the ankle.

LEFT Lower Extremity

Inflow: Less than 25% stenosis of the proximal left common iliac
artery due to mixed calcified and noncalcified plaque. Severe
stenosis at the origin of the origin of the left internal iliac
artery which is otherwise patent. There is low-density intraluminal
filling defect within the proximal left external iliac artery with
greater than 75% stenosis which is highly suspicious for embolus.
Left external iliac artery otherwise patent.

Outflow: The common femoral artery is patent. There is abrupt
occlusion of the distal left Profunda femoris artery likely due to
embolus. Less than 25% stenosis of the distal left SFA due to
noncalcified plaque. There is occlusion of the popliteal artery
suspicious for embolism.

Runoff: Tibioperoneal trunk is occluded likely due to embolus.
Discontinuous opacification of the posterior tibial artery is seen.
Anterior tibial and peroneal arteries are occluded.

Veins: No obvious venous abnormality within the limitations of this
arterial phase study.

Review of the MIP images confirms the above findings.

NON-VASCULAR

Lower chest: Incidental note of the pulmonary emboli in the right
lower lobe segmental branches best seen on image 8 of series 4. 2 mm
subpleural nodule in the posteromedial left lower lobe is most
likely an intrapulmonary lymph node it does not require further
follow-up.

Hepatobiliary: No focal liver abnormality is seen. No gallstones,
gallbladder wall thickening, or biliary dilatation.

Pancreas: Unremarkable. No pancreatic ductal dilatation or
surrounding inflammatory changes.

Spleen: Normal in size without focal abnormality.

Adrenals/Urinary Tract: Adrenal glands are normal. 4.4 cm simple
cyst present in the left kidney. Kidneys, adrenal glands, and
bladder otherwise unremarkable.

Stomach/Bowel: Stomach is within normal limits. Appendix appears
normal. No evidence of bowel wall thickening, distention, or
inflammatory changes.

Lymphatic: Mildly enlarged left inguinal lymph node measuring 1.2 cm
in short axis. Mildly enlarged retroperitoneal lymph nodes are seen
with the largest measuring 1.2 cm in short axis.

Reproductive: Prostate is unremarkable.  Large left hydrocele.

Other: No abdominal wall hernia or abnormality. No abdominopelvic
ascites.

Musculoskeletal: No acute or significant osseous findings.
IMPRESSION: VASCULAR

1. Multifocal high-grade stenoses or occlusion indicative of
showering emboli.
2. 75% stenosis of the right common iliac artery.
3. Greater than 75% stenosis of the left external iliac artery
origin.
4. Occlusion of distal left profunda femoris artery.
5. Occluded left popliteal, anterior tibial, and peroneal arteries
with discontinuous opacification of posterior tibial artery.

NON-VASCULAR

1. Acute pulmonary emboli noted in the right lower lobe segmental
branches.
2. Mildly enlarged retroperitoneal and left pelvic lymph nodes
suspicious for metastatic disease versus lymphoma/leukemia.

These results were called by telephone at the time of interpretation
on [DATE] at [DATE] to provider JAYLON , who verbally
acknowledged these results.

## 2022-02-26 SURGERY — THROMBECTOMY, ARTERY, FEMORAL
Anesthesia: General | Site: Leg Upper | Laterality: Left

## 2022-02-26 MED ORDER — OXYCODONE-ACETAMINOPHEN 5-325 MG PO TABS
1.0000 | ORAL_TABLET | ORAL | Status: DC | PRN
  Administered 2022-02-26 – 2022-03-02 (×11): 2 via ORAL
  Administered 2022-03-03: 1 via ORAL
  Administered 2022-03-03 – 2022-03-06 (×6): 2 via ORAL
  Filled 2022-02-26: qty 1
  Filled 2022-02-26 (×18): qty 2

## 2022-02-26 MED ORDER — LACTATED RINGERS IV SOLN: INTRAVENOUS | Status: DC

## 2022-02-26 MED ORDER — PREDNISONE 20 MG PO TABS
60.0000 mg | ORAL_TABLET | Freq: Every day | ORAL | Status: DC
  Administered 2022-02-27 – 2022-03-08 (×10): 60 mg via ORAL
  Filled 2022-02-26 (×9): qty 3
  Filled 2022-02-26: qty 1

## 2022-02-26 MED ORDER — ORAL CARE MOUTH RINSE: 15.0000 mL | Freq: Once | OROMUCOSAL | Status: AC

## 2022-02-26 MED ORDER — SODIUM CHLORIDE 0.9% IV SOLUTION: Freq: Once | INTRAVENOUS | Status: DC

## 2022-02-26 MED ORDER — IODIXANOL 320 MG/ML IV SOLN
INTRAVENOUS | Status: DC | PRN
  Administered 2022-02-26: 70 mL via INTRAVENOUS

## 2022-02-26 MED ORDER — SUGAMMADEX SODIUM 500 MG/5ML IV SOLN
INTRAVENOUS | Status: DC | PRN
  Administered 2022-02-26 (×5): 100 mg via INTRAVENOUS

## 2022-02-26 MED ORDER — MIDAZOLAM HCL 2 MG/2ML IJ SOLN
INTRAMUSCULAR | Status: DC | PRN
  Administered 2022-02-26: 2 mg via INTRAVENOUS

## 2022-02-26 MED ORDER — SENNOSIDES-DOCUSATE SODIUM 8.6-50 MG PO TABS: 1.0000 | ORAL_TABLET | Freq: Every evening | ORAL | Status: DC | PRN

## 2022-02-26 MED ORDER — HEPARIN (PORCINE) 25000 UT/250ML-% IV SOLN: 18.0000 [IU]/kg/h | INTRAVENOUS | Status: DC

## 2022-02-26 MED ORDER — LABETALOL HCL 5 MG/ML IV SOLN
INTRAVENOUS | Status: DC | PRN
  Administered 2022-02-26 (×2): 5 mg via INTRAVENOUS

## 2022-02-26 MED ORDER — ACETAMINOPHEN 325 MG PO TABS
650.0000 mg | ORAL_TABLET | Freq: Four times a day (QID) | ORAL | Status: DC | PRN
  Administered 2022-02-27: 650 mg via ORAL
  Filled 2022-02-26: qty 2

## 2022-02-26 MED ORDER — HYDROCORTISONE SOD SUC (PF) 100 MG IJ SOLR
INTRAMUSCULAR | Status: DC | PRN
  Administered 2022-02-26: 50 mg via INTRAVENOUS

## 2022-02-26 MED ORDER — LIDOCAINE HCL (CARDIAC) PF 100 MG/5ML IV SOSY
PREFILLED_SYRINGE | INTRAVENOUS | Status: DC | PRN
  Administered 2022-02-26: 80 mg via INTRAVENOUS

## 2022-02-26 MED ORDER — ROCURONIUM 10MG/ML (10ML) SYRINGE FOR MEDFUSION PUMP - OPTIME
INTRAVENOUS | Status: DC | PRN
  Administered 2022-02-26: 30 mg via INTRAVENOUS
  Administered 2022-02-26: 70 mg via INTRAVENOUS
  Administered 2022-02-26: 30 mg via INTRAVENOUS

## 2022-02-26 MED ORDER — SODIUM CHLORIDE 0.9% FLUSH: 3.0000 mL | INTRAVENOUS | Status: DC | PRN

## 2022-02-26 MED ORDER — ONDANSETRON HCL 4 MG/2ML IJ SOLN
INTRAMUSCULAR | Status: DC | PRN
  Administered 2022-02-26: 4 mg via INTRAVENOUS

## 2022-02-26 MED ORDER — IOHEXOL 350 MG/ML SOLN
125.0000 mL | Freq: Once | INTRAVENOUS | Status: AC | PRN
  Administered 2022-02-26: 125 mL via INTRAVENOUS

## 2022-02-26 MED ORDER — HYDRALAZINE HCL 20 MG/ML IJ SOLN: 10.0000 mg | Freq: Three times a day (TID) | INTRAMUSCULAR | Status: DC | PRN

## 2022-02-26 MED ORDER — SODIUM CHLORIDE 0.9 % IV SOLN: 250.0000 mL | INTRAVENOUS | Status: DC | PRN

## 2022-02-26 MED ORDER — PRAVASTATIN SODIUM 40 MG PO TABS
40.0000 mg | ORAL_TABLET | Freq: Every day | ORAL | Status: DC
  Administered 2022-02-26 – 2022-03-04 (×7): 40 mg via ORAL
  Filled 2022-02-26 (×7): qty 1

## 2022-02-26 MED ORDER — LACTATED RINGERS IV SOLN: INTRAVENOUS | Status: DC | PRN

## 2022-02-26 MED ORDER — DULOXETINE HCL 60 MG PO CPEP
60.0000 mg | ORAL_CAPSULE | Freq: Two times a day (BID) | ORAL | Status: DC
  Administered 2022-02-26 – 2022-03-08 (×20): 60 mg via ORAL
  Filled 2022-02-26 (×20): qty 1

## 2022-02-26 MED ORDER — PHENYLEPHRINE HCL (PRESSORS) 10 MG/ML IV SOLN
INTRAVENOUS | Status: DC | PRN
  Administered 2022-02-26: 80 ug via INTRAVENOUS

## 2022-02-26 MED ORDER — HEPARIN (PORCINE) 25000 UT/250ML-% IV SOLN
2000.0000 [IU]/h | INTRAVENOUS | Status: DC
  Administered 2022-02-26: 1600 [IU]/h via INTRAVENOUS
  Administered 2022-02-27: 1500 [IU]/h via INTRAVENOUS
  Administered 2022-03-01 – 2022-03-03 (×3): 2000 [IU]/h via INTRAVENOUS
  Filled 2022-02-26 (×9): qty 250

## 2022-02-26 MED ORDER — ESMOLOL HCL 100 MG/10ML IV SOLN
INTRAVENOUS | Status: DC | PRN
  Administered 2022-02-26: 30 mg via INTRAVENOUS
  Administered 2022-02-26: 20 mg via INTRAVENOUS

## 2022-02-26 MED ORDER — ACETAMINOPHEN 160 MG/5ML PO SOLN: 1000.0000 mg | Freq: Once | ORAL | Status: DC | PRN

## 2022-02-26 MED ORDER — HYDROCHLOROTHIAZIDE 12.5 MG PO TABS
12.5000 mg | ORAL_TABLET | Freq: Every day | ORAL | Status: DC
  Administered 2022-02-26 – 2022-02-27 (×2): 12.5 mg via ORAL
  Filled 2022-02-26 (×2): qty 1

## 2022-02-26 MED ORDER — 0.9 % SODIUM CHLORIDE (POUR BTL) OPTIME
TOPICAL | Status: DC | PRN
  Administered 2022-02-26: 2000 mL

## 2022-02-26 MED ORDER — NORTRIPTYLINE HCL 10 MG PO CAPS
10.0000 mg | ORAL_CAPSULE | Freq: Every day | ORAL | Status: DC
  Administered 2022-02-26 – 2022-03-07 (×10): 10 mg via ORAL
  Filled 2022-02-26 (×12): qty 1

## 2022-02-26 MED ORDER — SUCCINYLCHOLINE CHLORIDE 200 MG/10ML IV SOSY
PREFILLED_SYRINGE | INTRAVENOUS | Status: DC | PRN
  Administered 2022-02-26: 160 mg via INTRAVENOUS

## 2022-02-26 MED ORDER — ACETAMINOPHEN 650 MG RE SUPP
650.0000 mg | Freq: Four times a day (QID) | RECTAL | Status: DC | PRN
  Administered 2022-02-27: 650 mg via RECTAL
  Filled 2022-02-26: qty 1

## 2022-02-26 MED ORDER — GABAPENTIN 300 MG PO CAPS
900.0000 mg | ORAL_CAPSULE | Freq: Three times a day (TID) | ORAL | Status: DC
  Administered 2022-02-26 – 2022-02-27 (×2): 900 mg via ORAL
  Filled 2022-02-26 (×2): qty 3

## 2022-02-26 MED ORDER — DEXTROSE 5 % IV SOLN
INTRAVENOUS | Status: DC | PRN
  Administered 2022-02-26: 3 g via INTRAVENOUS

## 2022-02-26 MED ORDER — INSULIN ASPART 100 UNIT/ML IJ SOLN
0.0000 [IU] | INTRAMUSCULAR | Status: AC | PRN
  Administered 2022-02-26: 6 [IU] via SUBCUTANEOUS
  Administered 2022-02-26: 4 [IU] via SUBCUTANEOUS
  Filled 2022-02-26: qty 1

## 2022-02-26 MED ORDER — GLYCOPYRROLATE 0.2 MG/ML IJ SOLN
INTRAMUSCULAR | Status: DC | PRN
  Administered 2022-02-26: .2 mg via INTRAVENOUS

## 2022-02-26 MED ORDER — IRBESARTAN 75 MG PO TABS
75.0000 mg | ORAL_TABLET | Freq: Every day | ORAL | Status: DC
  Administered 2022-02-26: 75 mg via ORAL
  Filled 2022-02-26 (×2): qty 1

## 2022-02-26 MED ORDER — ACETAMINOPHEN 10 MG/ML IV SOLN: 1000.0000 mg | Freq: Once | INTRAVENOUS | Status: DC | PRN

## 2022-02-26 MED ORDER — OXYCODONE HCL 5 MG/5ML PO SOLN: 5.0000 mg | Freq: Once | ORAL | Status: DC | PRN

## 2022-02-26 MED ORDER — CEFAZOLIN IN SODIUM CHLORIDE 3-0.9 GM/100ML-% IV SOLN
INTRAVENOUS | Status: AC
  Filled 2022-02-26: qty 100

## 2022-02-26 MED ORDER — FLUTICASONE PROPIONATE 50 MCG/ACT NA SUSP
1.0000 | Freq: Every day | NASAL | Status: DC | PRN
Start: 2022-02-26 — End: 2022-03-08

## 2022-02-26 MED ORDER — PROPOFOL 10 MG/ML IV BOLUS
INTRAVENOUS | Status: DC | PRN
  Administered 2022-02-26: 120 mg via INTRAVENOUS

## 2022-02-26 MED ORDER — CEFAZOLIN SODIUM-DEXTROSE 2-4 GM/100ML-% IV SOLN
INTRAVENOUS | Status: AC
  Filled 2022-02-26: qty 100

## 2022-02-26 MED ORDER — ACETAMINOPHEN 500 MG PO TABS: 1000.0000 mg | ORAL_TABLET | Freq: Once | ORAL | Status: DC | PRN

## 2022-02-26 MED ORDER — HEPARIN 6000 UNIT IRRIGATION SOLUTION
Status: DC | PRN
  Administered 2022-02-26: 1

## 2022-02-26 MED ORDER — PHENYLEPHRINE HCL-NACL 20-0.9 MG/250ML-% IV SOLN
INTRAVENOUS | Status: DC | PRN
  Administered 2022-02-26: 30 ug/min via INTRAVENOUS

## 2022-02-26 MED ORDER — FOLIC ACID 1 MG PO TABS
1.0000 mg | ORAL_TABLET | Freq: Every day | ORAL | Status: DC
Start: 2022-02-26 — End: 2022-03-08
  Administered 2022-02-26 – 2022-03-08 (×11): 1 mg via ORAL
  Filled 2022-02-26 (×11): qty 1

## 2022-02-26 MED ORDER — FENTANYL CITRATE (PF) 100 MCG/2ML IJ SOLN: 25.0000 ug | INTRAMUSCULAR | Status: DC | PRN

## 2022-02-26 MED ORDER — SODIUM CHLORIDE 0.9% FLUSH
3.0000 mL | Freq: Two times a day (BID) | INTRAVENOUS | Status: DC
  Administered 2022-02-26 – 2022-03-08 (×17): 3 mL via INTRAVENOUS

## 2022-02-26 MED ORDER — OXYCODONE HCL 5 MG PO TABS: 5.0000 mg | ORAL_TABLET | Freq: Once | ORAL | Status: DC | PRN

## 2022-02-26 MED ORDER — MORPHINE SULFATE (PF) 2 MG/ML IV SOLN
2.0000 mg | INTRAVENOUS | Status: DC | PRN
  Administered 2022-02-28: 2 mg via INTRAVENOUS
  Filled 2022-02-26: qty 1

## 2022-02-26 MED ORDER — INSULIN ASPART 100 UNIT/ML IJ SOLN
0.0000 [IU] | Freq: Three times a day (TID) | INTRAMUSCULAR | Status: DC
  Administered 2022-02-27: 8 [IU] via SUBCUTANEOUS
  Administered 2022-02-27: 3 [IU] via SUBCUTANEOUS

## 2022-02-26 MED ORDER — FENTANYL CITRATE (PF) 250 MCG/5ML IJ SOLN
INTRAMUSCULAR | Status: DC | PRN
Start: 2022-02-26 — End: 2022-02-26
  Administered 2022-02-26: 150 ug via INTRAVENOUS
  Administered 2022-02-26: 50 ug via INTRAVENOUS

## 2022-02-26 MED ORDER — HEPARIN BOLUS VIA INFUSION
5000.0000 [IU] | Freq: Once | INTRAVENOUS | Status: AC
Start: 2022-02-26 — End: 2022-02-26
  Administered 2022-02-26: 5000 [IU] via INTRAVENOUS

## 2022-02-26 MED ORDER — BUDESONIDE 0.5 MG/2ML IN SUSP: 1.0000 mg | Freq: Two times a day (BID) | RESPIRATORY_TRACT | Status: DC | PRN

## 2022-02-26 MED ORDER — HEPARIN SODIUM (PORCINE) 1000 UNIT/ML IJ SOLN
INTRAMUSCULAR | Status: DC | PRN
  Administered 2022-02-26: 10000 [IU] via INTRAVENOUS

## 2022-02-26 MED ORDER — NALOXONE HCL 0.4 MG/ML IJ SOLN
INTRAMUSCULAR | Status: DC | PRN
  Administered 2022-02-26: 80 ug via INTRAVENOUS

## 2022-02-26 MED ORDER — ALBUTEROL SULFATE (2.5 MG/3ML) 0.083% IN NEBU: 2.5000 mg | INHALATION_SOLUTION | Freq: Four times a day (QID) | RESPIRATORY_TRACT | Status: DC | PRN

## 2022-02-26 MED ORDER — CHLORHEXIDINE GLUCONATE 0.12 % MT SOLN
15.0000 mL | Freq: Once | OROMUCOSAL | Status: AC
  Administered 2022-02-26: 15 mL via OROMUCOSAL

## 2022-02-26 SURGICAL SUPPLY — 69 items
BAG COUNTER SPONGE SURGICOUNT (BAG) ×3 IMPLANT
BANDAGE ESMARK 6X9 LF (GAUZE/BANDAGES/DRESSINGS) IMPLANT
BNDG ELASTIC 6X5.8 VLCR STR LF (GAUZE/BANDAGES/DRESSINGS) ×1 IMPLANT
BNDG ESMARK 6X9 LF (GAUZE/BANDAGES/DRESSINGS)
CANISTER SUCT 3000ML PPV (MISCELLANEOUS) ×3 IMPLANT
CATH EMB 3FR 80CM (CATHETERS) ×1 IMPLANT
CATH EMB 4FR 40CM (CATHETERS) ×1 IMPLANT
CATH EMB 4FR 80CM (CATHETERS) ×1 IMPLANT
CLIP VESOCCLUDE MED 24/CT (CLIP) ×3 IMPLANT
CLIP VESOCCLUDE SM WIDE 24/CT (CLIP) ×3 IMPLANT
COVER PROBE W GEL 5X96 (DRAPES) ×3 IMPLANT
CUFF TOURN SGL QUICK 24 (TOURNIQUET CUFF)
CUFF TOURN SGL QUICK 34 (TOURNIQUET CUFF)
CUFF TOURN SGL QUICK 42 (TOURNIQUET CUFF) IMPLANT
CUFF TRNQT CYL 24X4X16.5-23 (TOURNIQUET CUFF) IMPLANT
CUFF TRNQT CYL 34X4.125X (TOURNIQUET CUFF) IMPLANT
DERMABOND ADVANCED (GAUZE/BANDAGES/DRESSINGS) ×2
DERMABOND ADVANCED .7 DNX12 (GAUZE/BANDAGES/DRESSINGS) IMPLANT
DRAIN CHANNEL 15F RND FF W/TCR (WOUND CARE) IMPLANT
DRAPE C-ARM 42X72 X-RAY (DRAPES) ×3 IMPLANT
DRSG COVADERM 4X10 (GAUZE/BANDAGES/DRESSINGS) ×2 IMPLANT
ELECT BLADE 4.0 EZ CLEAN MEGAD (MISCELLANEOUS) ×3
ELECT REM PT RETURN 9FT ADLT (ELECTROSURGICAL) ×3
ELECTRODE BLDE 4.0 EZ CLN MEGD (MISCELLANEOUS) IMPLANT
ELECTRODE REM PT RTRN 9FT ADLT (ELECTROSURGICAL) ×2 IMPLANT
EVACUATOR SILICONE 100CC (DRAIN) IMPLANT
GLOVE BIO SURGEON STRL SZ7.5 (GLOVE) ×3 IMPLANT
GLOVE BIOGEL PI IND STRL 8 (GLOVE) ×2 IMPLANT
GLOVE BIOGEL PI INDICATOR 8 (GLOVE) ×1
GLOVE SURG SS PI 6.5 STRL IVOR (GLOVE) ×1 IMPLANT
GOWN STRL REUS W/ TWL LRG LVL3 (GOWN DISPOSABLE) ×4 IMPLANT
GOWN STRL REUS W/ TWL XL LVL3 (GOWN DISPOSABLE) ×4 IMPLANT
GOWN STRL REUS W/TWL LRG LVL3 (GOWN DISPOSABLE) ×2
GOWN STRL REUS W/TWL XL LVL3 (GOWN DISPOSABLE) ×2
HEMOSTAT SPONGE AVITENE ULTRA (HEMOSTASIS) IMPLANT
INSERT FOGARTY SM (MISCELLANEOUS) IMPLANT
KIT BASIN OR (CUSTOM PROCEDURE TRAY) ×3 IMPLANT
KIT TURNOVER KIT B (KITS) ×3 IMPLANT
LOOP VESSEL MINI RED (MISCELLANEOUS) IMPLANT
NS IRRIG 1000ML POUR BTL (IV SOLUTION) ×6 IMPLANT
PACK PERIPHERAL VASCULAR (CUSTOM PROCEDURE TRAY) ×3 IMPLANT
PAD ARMBOARD 7.5X6 YLW CONV (MISCELLANEOUS) ×6 IMPLANT
SET MICROPUNCTURE 5F STIFF (MISCELLANEOUS) ×1 IMPLANT
STAPLER VISISTAT 35W (STAPLE) ×1 IMPLANT
STOPCOCK 3 WAY HIGH PRESSURE (MISCELLANEOUS)
STOPCOCK 3WAY HIGH PRESSURE (MISCELLANEOUS) IMPLANT
STOPCOCK 4 WAY LG BORE MALE ST (IV SETS) ×2 IMPLANT
SUT ETHILON 3 0 PS 1 (SUTURE) IMPLANT
SUT GORETEX 5 0 TT13 24 (SUTURE) IMPLANT
SUT GORETEX 6.0 TT13 (SUTURE) IMPLANT
SUT MNCRL AB 4-0 PS2 18 (SUTURE) ×7 IMPLANT
SUT PROLENE 5 0 C 1 24 (SUTURE) ×7 IMPLANT
SUT PROLENE 6 0 BV (SUTURE) ×4 IMPLANT
SUT PROLENE 7 0 BV 1 (SUTURE) IMPLANT
SUT SILK 2 0 PERMA HAND 18 BK (SUTURE) IMPLANT
SUT SILK 3 0 (SUTURE)
SUT SILK 3-0 18XBRD TIE 12 (SUTURE) IMPLANT
SUT VIC AB 2-0 CT1 27 (SUTURE) ×3
SUT VIC AB 2-0 CT1 TAPERPNT 27 (SUTURE) ×4 IMPLANT
SUT VIC AB 3-0 SH 27 (SUTURE) ×1
SUT VIC AB 3-0 SH 27X BRD (SUTURE) ×6 IMPLANT
SYR 30ML LL (SYRINGE) ×1 IMPLANT
SYR 3ML LL SCALE MARK (SYRINGE) ×3 IMPLANT
SYR TB 1ML LUER SLIP (SYRINGE) ×2 IMPLANT
TOWEL GREEN STERILE (TOWEL DISPOSABLE) ×3 IMPLANT
TRAY FOLEY MTR SLVR 16FR STAT (SET/KITS/TRAYS/PACK) ×3 IMPLANT
TUBING EXTENTION W/L.L. (IV SETS) IMPLANT
UNDERPAD 30X36 HEAVY ABSORB (UNDERPADS AND DIAPERS) ×3 IMPLANT
WATER STERILE IRR 1000ML POUR (IV SOLUTION) ×3 IMPLANT

## 2022-02-26 NOTE — Assessment & Plan Note (Addendum)
Patient's home hydrochlorothiazide and Diovan were restarted. Patient now with hypotension. -Discontinue hydrochlorothiazide and Diovan

## 2022-02-26 NOTE — Discharge Instructions (Addendum)
Information on my medicine - ELIQUIS (apixaban)  This medication education was reviewed with me or my healthcare representative as part of my discharge preparation.  The pharmacist that spoke with me during my hospital stay was:  Kendal Hymen, [facility]  Why was Eliquis prescribed for you? Eliquis was prescribed to treat blood clots that may have been found in the veins of your legs (deep vein thrombosis) or in your lungs (pulmonary embolism) and to reduce the risk of them occurring again.  What do You need to know about Eliquis ? The starting dose is 10 mg (two 5 mg tablets) taken TWICE daily for the FIRST SEVEN (7) DAYS, then on 03/10/2022  the dose is reduced to ONE 5 mg tablet taken TWICE daily.  Eliquis may be taken with or without food.   Try to take the dose about the same time in the morning and in the evening. If you have difficulty swallowing the tablet whole please discuss with your pharmacist how to take the medication safely.  Take Eliquis exactly as prescribed and DO NOT stop taking Eliquis without talking to the doctor who prescribed the medication.  Stopping may increase your risk of developing a new blood clot.  Refill your prescription before you run out.  After discharge, you should have regular check-up appointments with your healthcare provider that is prescribing your Eliquis.    What do you do if you miss a dose? If a dose of ELIQUIS is not taken at the scheduled time, take it as soon as possible on the same day and twice-daily administration should be resumed. The dose should not be doubled to make up for a missed dose.  Important Safety Information A possible side effect of Eliquis is bleeding. You should call your healthcare provider right away if you experience any of the following: Bleeding from an injury or your nose that does not stop. Unusual colored urine (red or dark brown) or unusual colored stools (red or black). Unusual bruising for unknown  reasons. A serious fall or if you hit your head (even if there is no bleeding).  Some medicines may interact with Eliquis and might increase your risk of bleeding or clotting while on Eliquis. To help avoid this, consult your healthcare provider or pharmacist prior to using any new prescription or non-prescription medications, including herbals, vitamins, non-steroidal anti-inflammatory drugs (NSAIDs) and supplements.  This website has more information on Eliquis (apixaban): http://www.eliquis.com/eliquis/home

## 2022-02-26 NOTE — Progress Notes (Signed)
ANTICOAGULATION CONSULT NOTE - Follow Up Consult  Pharmacy Consult for heparin Indication: DVT now s/p thrombectomy  Labs: Recent Labs    02/26/22 1046 02/26/22 1625 02/26/22 1710 02/26/22 2051 02/26/22 2258  HGB 13.7 9.9* 10.5*  --   --   HCT 42.3 29.0* 31.0*  --   --   PLT 214  --   --   --   --   HEPARINUNFRC  --   --   --   --  0.80*  CREATININE 1.11  --   --   --   --   TROPONINIHS  --   --   --  21* 14    Assessment: 46yo male supratherapeutic on heparin after resuming s/p vascular procedure though pt did have a large heparin bolus and heparin irrigation during thrombectomy, which could still be affecting level; no infusion issues or signs of bleeding per RN.  Goal of Therapy:  Heparin level 0.3-0.7 units/ml   Plan:  Will decrease heparin infusion by 1 units/kgABW/hr to 1500 units/hr and check level in 6 hours.    Vernard Gambles, PharmD, BCPS  02/26/2022,11:57 PM

## 2022-02-26 NOTE — Assessment & Plan Note (Addendum)
Stable  - Continue albuterol PRN

## 2022-02-26 NOTE — Assessment & Plan Note (Addendum)
Hemoglobin A1C of 10.4%. Patient is on metformin and glipizide as an outpatient. Started on SSI inpatient. -Continue SSI

## 2022-02-27 ENCOUNTER — Encounter (HOSPITAL_COMMUNITY): Payer: Self-pay | Admitting: Vascular Surgery

## 2022-02-27 ENCOUNTER — Inpatient Hospital Stay (HOSPITAL_COMMUNITY): Payer: No Typology Code available for payment source

## 2022-02-27 DIAGNOSIS — R6521 Severe sepsis with septic shock: Secondary | ICD-10-CM

## 2022-02-27 DIAGNOSIS — A419 Sepsis, unspecified organism: Secondary | ICD-10-CM

## 2022-02-27 DIAGNOSIS — N179 Acute kidney failure, unspecified: Secondary | ICD-10-CM

## 2022-02-27 DIAGNOSIS — I998 Other disorder of circulatory system: Secondary | ICD-10-CM | POA: Diagnosis not present

## 2022-02-27 DIAGNOSIS — I2699 Other pulmonary embolism without acute cor pulmonale: Secondary | ICD-10-CM

## 2022-02-27 DIAGNOSIS — I888 Other nonspecific lymphadenitis: Secondary | ICD-10-CM | POA: Diagnosis not present

## 2022-02-27 DIAGNOSIS — I82451 Acute embolism and thrombosis of right peroneal vein: Secondary | ICD-10-CM | POA: Diagnosis not present

## 2022-02-27 LAB — CBC
HCT: 38.7 % — ABNORMAL LOW (ref 39.0–52.0)
Hemoglobin: 12.6 g/dL — ABNORMAL LOW (ref 13.0–17.0)
MCH: 30.2 pg (ref 26.0–34.0)
MCHC: 32.6 g/dL (ref 30.0–36.0)
MCV: 92.8 fL (ref 80.0–100.0)
Platelets: 222 10*3/uL (ref 150–400)
RBC: 4.17 MIL/uL — ABNORMAL LOW (ref 4.22–5.81)
RDW: 14.4 % (ref 11.5–15.5)
WBC: 13.8 10*3/uL — ABNORMAL HIGH (ref 4.0–10.5)
nRBC: 1.6 % — ABNORMAL HIGH (ref 0.0–0.2)

## 2022-02-27 LAB — GLUCOSE, CAPILLARY
Glucose-Capillary: 185 mg/dL — ABNORMAL HIGH (ref 70–99)
Glucose-Capillary: 272 mg/dL — ABNORMAL HIGH (ref 70–99)
Glucose-Capillary: 499 mg/dL — ABNORMAL HIGH (ref 70–99)
Glucose-Capillary: 394 mg/dL — ABNORMAL HIGH (ref 70–99)
Glucose-Capillary: 471 mg/dL — ABNORMAL HIGH (ref 70–99)
Glucose-Capillary: 271 mg/dL — ABNORMAL HIGH (ref 70–99)
Glucose-Capillary: 283 mg/dL — ABNORMAL HIGH (ref 70–99)
Glucose-Capillary: 219 mg/dL — ABNORMAL HIGH (ref 70–99)
Glucose-Capillary: 213 mg/dL — ABNORMAL HIGH (ref 70–99)
Glucose-Capillary: 217 mg/dL — ABNORMAL HIGH (ref 70–99)
Glucose-Capillary: 237 mg/dL — ABNORMAL HIGH (ref 70–99)

## 2022-02-27 LAB — BASIC METABOLIC PANEL
Anion gap: 11 (ref 5–15)
BUN: 19 mg/dL (ref 6–20)
CO2: 28 mmol/L (ref 22–32)
Calcium: 8.3 mg/dL — ABNORMAL LOW (ref 8.9–10.3)
Chloride: 100 mmol/L (ref 98–111)
Creatinine, Ser: 2.86 mg/dL — ABNORMAL HIGH (ref 0.61–1.24)
GFR, Estimated: 26 mL/min — ABNORMAL LOW (ref >60)
Glucose, Bld: 185 mg/dL — ABNORMAL HIGH (ref 70–99)
Potassium: 5 mmol/L (ref 3.5–5.1)
Sodium: 139 mmol/L (ref 135–145)

## 2022-02-27 LAB — PROCALCITONIN: Procalcitonin: 0.76 ng/mL

## 2022-02-27 LAB — LACTIC ACID, PLASMA
Lactic Acid, Venous: 2.9 mmol/L (ref 0.5–1.9)
Lactic Acid, Venous: 1.2 mmol/L (ref 0.5–1.9)

## 2022-02-27 LAB — HEPARIN LEVEL (UNFRACTIONATED)
Heparin Unfractionated: 0.46 IU/mL (ref 0.30–0.70)
Heparin Unfractionated: <0.1 IU/mL — ABNORMAL LOW (ref 0.30–0.70)
Heparin Unfractionated: 0.3 IU/mL (ref 0.30–0.70)

## 2022-02-27 LAB — MRSA NEXT GEN BY PCR, NASAL: MRSA by PCR Next Gen: NOT DETECTED

## 2022-02-27 LAB — PROTIME-INR
INR: 1.2 (ref 0.8–1.2)
Prothrombin Time: 14.8 seconds (ref 11.4–15.2)

## 2022-02-27 LAB — APTT: aPTT: 64 seconds — ABNORMAL HIGH (ref 24–36)

## 2022-02-27 MED ORDER — ORAL CARE MOUTH RINSE: 15.0000 mL | OROMUCOSAL | Status: DC | PRN

## 2022-02-27 MED ORDER — METOPROLOL TARTRATE 25 MG PO TABS
25.0000 mg | ORAL_TABLET | Freq: Two times a day (BID) | ORAL | Status: DC
  Administered 2022-02-27: 25 mg via ORAL
  Filled 2022-02-27: qty 1

## 2022-02-27 MED ORDER — GABAPENTIN 300 MG PO CAPS
300.0000 mg | ORAL_CAPSULE | Freq: Three times a day (TID) | ORAL | Status: DC
  Administered 2022-02-28 – 2022-03-03 (×10): 300 mg via ORAL
  Filled 2022-02-27 (×10): qty 1

## 2022-02-27 MED ORDER — SODIUM CHLORIDE 0.9 % IV SOLN
250.0000 mL | INTRAVENOUS | Status: DC
  Administered 2022-02-27 – 2022-03-01 (×2): 250 mL via INTRAVENOUS

## 2022-02-27 MED ORDER — DEXTROSE IN LACTATED RINGERS 5 % IV SOLN
INTRAVENOUS | Status: DC
Start: 2022-02-27 — End: 2022-02-28

## 2022-02-27 MED ORDER — LACTATED RINGERS IV BOLUS (SEPSIS)
1000.0000 mL | Freq: Once | INTRAVENOUS | Status: AC
  Administered 2022-02-27: 1000 mL via INTRAVENOUS

## 2022-02-27 MED ORDER — LACTATED RINGERS IV BOLUS (SEPSIS)
200.0000 mL | Freq: Once | INTRAVENOUS | Status: AC
Start: 2022-02-27 — End: 2022-02-27
  Administered 2022-02-27: 200 mL via INTRAVENOUS

## 2022-02-27 MED ORDER — VANCOMYCIN HCL 2000 MG/400ML IV SOLN
2000.0000 mg | Freq: Once | INTRAVENOUS | Status: AC
  Administered 2022-02-27: 2000 mg via INTRAVENOUS
  Filled 2022-02-27: qty 400

## 2022-02-27 MED ORDER — METRONIDAZOLE 500 MG/100ML IV SOLN
500.0000 mg | Freq: Two times a day (BID) | INTRAVENOUS | Status: DC
  Administered 2022-02-27 – 2022-03-02 (×7): 500 mg via INTRAVENOUS
  Filled 2022-02-27 (×7): qty 100

## 2022-02-27 MED ORDER — VANCOMYCIN HCL IN DEXTROSE 1-5 GM/200ML-% IV SOLN: 1000.0000 mg | INTRAVENOUS | Status: DC

## 2022-02-27 MED ORDER — VANCOMYCIN HCL IN DEXTROSE 1-5 GM/200ML-% IV SOLN: 1000.0000 mg | Freq: Once | INTRAVENOUS | Status: DC

## 2022-02-27 MED ORDER — CHLORHEXIDINE GLUCONATE CLOTH 2 % EX PADS
6.0000 | MEDICATED_PAD | Freq: Every day | CUTANEOUS | Status: DC
  Administered 2022-02-27 – 2022-03-01 (×3): 6 via TOPICAL

## 2022-02-27 MED ORDER — SODIUM CHLORIDE 0.9 % IV SOLN
2.0000 g | Freq: Two times a day (BID) | INTRAVENOUS | Status: DC
  Administered 2022-02-27 – 2022-02-28 (×4): 2 g via INTRAVENOUS
  Filled 2022-02-27 (×4): qty 12.5

## 2022-02-27 MED ORDER — DEXTROSE 50 % IV SOLN: 0.0000 mL | INTRAVENOUS | Status: DC | PRN

## 2022-02-27 MED ORDER — NOREPINEPHRINE 4 MG/250ML-% IV SOLN
2.0000 ug/min | INTRAVENOUS | Status: DC
  Administered 2022-02-27: 10 ug/min via INTRAVENOUS

## 2022-02-27 MED ORDER — LACTATED RINGERS IV SOLN
INTRAVENOUS | Status: DC
Start: 2022-02-27 — End: 2022-02-28

## 2022-02-27 MED ORDER — SODIUM CHLORIDE 0.9 % IV SOLN: 2.0000 g | Freq: Once | INTRAVENOUS | Status: DC

## 2022-02-27 MED ORDER — VANCOMYCIN HCL 2000 MG/400ML IV SOLN
2000.0000 mg | Freq: Once | INTRAVENOUS | Status: DC
Start: 2022-02-27 — End: 2022-02-27
  Filled 2022-02-27: qty 400

## 2022-02-27 MED ORDER — VANCOMYCIN HCL IN DEXTROSE 1-5 GM/200ML-% IV SOLN
1000.0000 mg | INTRAVENOUS | Status: DC
Start: 2022-02-27 — End: 2022-02-27

## 2022-02-27 MED ORDER — SODIUM CHLORIDE 0.9 % IV SOLN: INTRAVENOUS | Status: DC

## 2022-02-27 MED ORDER — INSULIN REGULAR(HUMAN) IN NACL 100-0.9 UT/100ML-% IV SOLN
INTRAVENOUS | Status: DC
Start: 2022-02-27 — End: 2022-02-28
  Administered 2022-02-27: 11.5 [IU]/h via INTRAVENOUS
  Administered 2022-02-28: 2.8 [IU]/h via INTRAVENOUS
  Filled 2022-02-27 (×2): qty 100

## 2022-02-27 NOTE — Assessment & Plan Note (Addendum)
Not present on admission. Unknown source, although patient was hospitalized in 11/2021 for fevers/encephalopathy with negative workup. Patient developed fever with Tmax of 104.5 F with associated sinus tachycardia, tachypnea and hypotension. Code sepsis initiated. Empiric Vancomycin, Cefepime and Flagyl started. Blood and urine culture obtained. IV fluid bolus initiated, however blood pressure significantly decreased so Levophed started. PCCM consulted for ICU transfer for vasopressor support.

## 2022-02-27 NOTE — Progress Notes (Signed)
Pharmacy Antibiotic Note  James Hartman is a 46 y.o. male admitted on 02/26/2022 with bilateral leg pain now s/p LLE thrombectomy and fasciotomies. Pharmacy has been consulted for cefepime and vancomycin dosing.  Patient with new symptoms overnight, including new fevers Tmax 103.4, elevated HR, and new AKI. Will conservatively dose antibiotics due to new AKI and adjust as needed.  Plan: Cefepime 2g Q12h Vancomycin 2000 mg IV x1, followed by vancomycin 1000 mg IV Q24h (eAUC 513, goal AUC 400-550, Scr 2.86, Vd 0.5)  Trend WBC, fever, renal function F/u cultures, clinical progress, levels as indicated De-escalate when able   Height: 5\' 8"  (172.7 cm) Weight: 131 kg (288 lb 12.8 oz) IBW/kg (Calculated) : 68.4  Temp (24hrs), Avg:99.8 F (37.7 C), Min:97.7 F (36.5 C), Max:104.5 F (40.3 C)  Recent Labs  Lab 02/26/22 1046 02/27/22 0624  WBC 16.2* 13.8*  CREATININE 1.11 2.86*    Estimated Creatinine Clearance: 41.7 mL/min (A) (by C-G formula based on SCr of 2.86 mg/dL (H)).    No Known Allergies  Antimicrobials this admission: Cefazolin 6/23 x2 doses (surgical prophylaxis)  Microbiology results: 6/24 BCx: pending 6/24 UCx: pending    Thank you for allowing pharmacy to be a part of this patient's care.  Thelma Barge, PharmD Clinical Pharmacist

## 2022-02-27 NOTE — Progress Notes (Signed)
An USGPIV (ultrasound guided PIV) has been placed for short-term vasopressor infusion. A correctly placed ivWatch must be used when administering Vasopressors. Should this treatment be needed beyond 72 hours, central line access should be obtained.  It will be the responsibility of the bedside nurse to follow best practice to prevent extravasations. Area marked on the pt's right forearm where the tip of the catheter is; good blood return noted at time of placement.  Pt being transferred to [facility] soon.

## 2022-02-27 NOTE — Progress Notes (Signed)
HR sustained 145-150 this morning MD paged

## 2022-02-27 NOTE — Progress Notes (Signed)
ANTICOAGULATION CONSULT NOTE  Pharmacy Consult for Heparin Indication: DVT  No Known Allergies  Patient Measurements: Height: 5\' 8"  (172.7 cm) Weight: 131 kg (288 lb 12.8 oz) IBW/kg (Calculated) : 68.4  Heparin Dosing Weight: 97 kg  Vital Signs: Temp: 99.3 F (37.4 C) (06/24 1145) Temp Source: Oral (06/24 1145) BP: 135/77 (06/24 1245) Pulse Rate: 112 (06/24 1245)  Labs: Recent Labs    02/26/22 1046 02/26/22 1625 02/26/22 1710 02/26/22 2051 02/26/22 2258 02/27/22 0624 02/27/22 0944 02/27/22 1240  HGB 13.7 9.9* 10.5*  --   --  12.6*  --   --   HCT 42.3 29.0* 31.0*  --   --  38.7*  --   --   PLT 214  --   --   --   --  222  --   --   APTT  --   --   --   --   --   --  64*  --   LABPROT  --   --   --   --   --   --  14.8  --   INR  --   --   --   --   --   --  1.2  --   HEPARINUNFRC  --   --   --   --  0.80* 0.46  --  <0.10*  CREATININE 1.11  --   --   --   --  2.86*  --   --   TROPONINIHS  --   --   --  21* 14  --   --   --      Estimated Creatinine Clearance: 41.7 mL/min (A) (by C-G formula based on SCr of 2.86 mg/dL (H)).   Assessment: 46 yo male who is s/p LLE thrombectomy and fasciotomies. Patient on any oral anticoagulants. Pharmacy consulted to manage heparin.   -Heparin level at the low end of goal on 1500 units/hr -transferred to ICU today for hypotension and lethargy   Goal of Therapy:  Heparin level 0.3-0.7 units/ml Monitor platelets by anticoagulation protocol: Yes   Plan:  -Increase heparin to 1600 units/hr -Check heparin level in 8 hrs   Harland German, PharmD Clinical Pharmacist **Pharmacist phone directory can now be found on [email or web address] ([email or web address]).  Listed under [facility].

## 2022-02-27 NOTE — Sepsis Progress Note (Signed)
[facility]link is monitoring code sepsis 

## 2022-02-28 DIAGNOSIS — I2699 Other pulmonary embolism without acute cor pulmonale: Secondary | ICD-10-CM | POA: Diagnosis not present

## 2022-02-28 DIAGNOSIS — N179 Acute kidney failure, unspecified: Secondary | ICD-10-CM | POA: Diagnosis not present

## 2022-02-28 DIAGNOSIS — I82451 Acute embolism and thrombosis of right peroneal vein: Secondary | ICD-10-CM | POA: Diagnosis not present

## 2022-02-28 DIAGNOSIS — I998 Other disorder of circulatory system: Secondary | ICD-10-CM | POA: Diagnosis not present

## 2022-02-28 LAB — CBC
HCT: 32.7 % — ABNORMAL LOW (ref 39.0–52.0)
Hemoglobin: 10.2 g/dL — ABNORMAL LOW (ref 13.0–17.0)
MCH: 29.6 pg (ref 26.0–34.0)
MCHC: 31.2 g/dL (ref 30.0–36.0)
MCV: 94.8 fL (ref 80.0–100.0)
Platelets: 148 10*3/uL — ABNORMAL LOW (ref 150–400)
RBC: 3.45 MIL/uL — ABNORMAL LOW (ref 4.22–5.81)
RDW: 14.2 % (ref 11.5–15.5)
WBC: 14.2 10*3/uL — ABNORMAL HIGH (ref 4.0–10.5)
nRBC: 0.1 % (ref 0.0–0.2)

## 2022-02-28 LAB — GLUCOSE, CAPILLARY
Glucose-Capillary: 114 mg/dL — ABNORMAL HIGH (ref 70–99)
Glucose-Capillary: 122 mg/dL — ABNORMAL HIGH (ref 70–99)
Glucose-Capillary: 123 mg/dL — ABNORMAL HIGH (ref 70–99)
Glucose-Capillary: 129 mg/dL — ABNORMAL HIGH (ref 70–99)
Glucose-Capillary: 143 mg/dL — ABNORMAL HIGH (ref 70–99)
Glucose-Capillary: 146 mg/dL — ABNORMAL HIGH (ref 70–99)
Glucose-Capillary: 153 mg/dL — ABNORMAL HIGH (ref 70–99)
Glucose-Capillary: 164 mg/dL — ABNORMAL HIGH (ref 70–99)
Glucose-Capillary: 191 mg/dL — ABNORMAL HIGH (ref 70–99)
Glucose-Capillary: 247 mg/dL — ABNORMAL HIGH (ref 70–99)
Glucose-Capillary: 291 mg/dL — ABNORMAL HIGH (ref 70–99)
Glucose-Capillary: 344 mg/dL — ABNORMAL HIGH (ref 70–99)

## 2022-02-28 LAB — COMPREHENSIVE METABOLIC PANEL
ALT: 26 U/L (ref 0–44)
AST: 44 U/L — ABNORMAL HIGH (ref 15–41)
Albumin: 2.4 g/dL — ABNORMAL LOW (ref 3.5–5.0)
Alkaline Phosphatase: 81 U/L (ref 38–126)
Anion gap: 13 (ref 5–15)
BUN: 31 mg/dL — ABNORMAL HIGH (ref 6–20)
CO2: 20 mmol/L — ABNORMAL LOW (ref 22–32)
Calcium: 7.7 mg/dL — ABNORMAL LOW (ref 8.9–10.3)
Chloride: 102 mmol/L (ref 98–111)
Creatinine, Ser: 2.32 mg/dL — ABNORMAL HIGH (ref 0.61–1.24)
GFR, Estimated: 34 mL/min — ABNORMAL LOW (ref >60)
Glucose, Bld: 164 mg/dL — ABNORMAL HIGH (ref 70–99)
Potassium: 4.4 mmol/L (ref 3.5–5.1)
Sodium: 135 mmol/L (ref 135–145)
Total Bilirubin: 0.8 mg/dL (ref 0.3–1.2)
Total Protein: 5.7 g/dL — ABNORMAL LOW (ref 6.5–8.1)

## 2022-02-28 LAB — HEPARIN LEVEL (UNFRACTIONATED)
Heparin Unfractionated: 0.1 IU/mL — ABNORMAL LOW (ref 0.30–0.70)
Heparin Unfractionated: 0.29 IU/mL — ABNORMAL LOW (ref 0.30–0.70)
Heparin Unfractionated: 0.53 IU/mL (ref 0.30–0.70)

## 2022-02-28 LAB — MAGNESIUM: Magnesium: 2 mg/dL (ref 1.7–2.4)

## 2022-02-28 LAB — PHOSPHORUS: Phosphorus: 6.3 mg/dL — ABNORMAL HIGH (ref 2.5–4.6)

## 2022-02-28 LAB — VANCOMYCIN, RANDOM: Vancomycin Rm: 6 ug/mL

## 2022-02-28 MED ORDER — DOCUSATE SODIUM 50 MG/5ML PO LIQD: 100.0000 mg | Freq: Two times a day (BID) | ORAL | Status: DC

## 2022-02-28 MED ORDER — INSULIN GLARGINE-YFGN 100 UNIT/ML ~~LOC~~ SOLN
14.0000 [IU] | Freq: Two times a day (BID) | SUBCUTANEOUS | Status: DC
  Administered 2022-02-28 – 2022-03-01 (×2): 14 [IU] via SUBCUTANEOUS
  Filled 2022-02-28 (×4): qty 0.14

## 2022-02-28 MED ORDER — INSULIN ASPART 100 UNIT/ML IJ SOLN
0.0000 [IU] | Freq: Three times a day (TID) | INTRAMUSCULAR | Status: DC
  Administered 2022-02-28: 11 [IU] via SUBCUTANEOUS
  Administered 2022-02-28: 5 [IU] via SUBCUTANEOUS
  Administered 2022-03-01: 15 [IU] via SUBCUTANEOUS
  Administered 2022-03-01: 11 [IU] via SUBCUTANEOUS
  Administered 2022-03-01: 5 [IU] via SUBCUTANEOUS
  Administered 2022-03-02: 3 [IU] via SUBCUTANEOUS
  Administered 2022-03-02: 15 [IU] via SUBCUTANEOUS
  Administered 2022-03-02: 2 [IU] via SUBCUTANEOUS
  Administered 2022-03-03 (×2): 8 [IU] via SUBCUTANEOUS
  Administered 2022-03-03: 2 [IU] via SUBCUTANEOUS
  Administered 2022-03-04: 5 [IU] via SUBCUTANEOUS
  Administered 2022-03-04: 11 [IU] via SUBCUTANEOUS
  Administered 2022-03-05: 2 [IU] via SUBCUTANEOUS
  Administered 2022-03-05: 5 [IU] via SUBCUTANEOUS
  Administered 2022-03-06: 3 [IU] via SUBCUTANEOUS
  Administered 2022-03-06: 2 [IU] via SUBCUTANEOUS
  Administered 2022-03-06: 8 [IU] via SUBCUTANEOUS
  Administered 2022-03-07: 15 [IU] via SUBCUTANEOUS
  Administered 2022-03-07 (×2): 2 [IU] via SUBCUTANEOUS
  Administered 2022-03-08: 3 [IU] via SUBCUTANEOUS

## 2022-02-28 MED ORDER — MIDAZOLAM-SODIUM CHLORIDE 100-0.9 MG/100ML-% IV SOLN: 0.0000 mg/h | INTRAVENOUS | Status: DC

## 2022-02-28 MED ORDER — POLYETHYLENE GLYCOL 3350 17 G PO PACK: 17.0000 g | PACK | Freq: Every day | ORAL | Status: DC

## 2022-02-28 MED ORDER — MIDAZOLAM BOLUS VIA INFUSION: 0.0000 mg | INTRAVENOUS | Status: DC | PRN

## 2022-02-28 MED ORDER — INSULIN ASPART 100 UNIT/ML IJ SOLN
0.0000 [IU] | Freq: Every day | INTRAMUSCULAR | Status: DC
  Administered 2022-03-01 (×2): 3 [IU] via SUBCUTANEOUS
  Administered 2022-03-02 – 2022-03-04 (×3): 4 [IU] via SUBCUTANEOUS
  Administered 2022-03-05: 2 [IU] via SUBCUTANEOUS
  Administered 2022-03-07: 4 [IU] via SUBCUTANEOUS

## 2022-02-28 MED ORDER — LACTATED RINGERS IV SOLN: INTRAVENOUS | Status: DC

## 2022-02-28 MED ORDER — SULFAMETHOXAZOLE-TRIMETHOPRIM 800-160 MG PO TABS
1.0000 | ORAL_TABLET | Freq: Every day | ORAL | Status: DC
  Administered 2022-02-28 – 2022-03-08 (×9): 1 via ORAL
  Filled 2022-02-28 (×9): qty 1

## 2022-02-28 MED ORDER — VANCOMYCIN HCL 1250 MG/250ML IV SOLN
1250.0000 mg | INTRAVENOUS | Status: DC
  Administered 2022-02-28: 1250 mg via INTRAVENOUS
  Filled 2022-02-28 (×2): qty 250

## 2022-02-28 NOTE — Progress Notes (Signed)
ANTICOAGULATION CONSULT NOTE  Pharmacy Consult for Heparin Indication: DVT  No Known Allergies  Patient Measurements: Height: 5\' 8"  (172.7 cm) Weight: 131 kg (288 lb 12.8 oz) IBW/kg (Calculated) : 68.4  Heparin Dosing Weight: 97 kg  Vital Signs: Temp: 98.7 F (37.1 C) (06/25 1100) BP: 109/59 (06/25 1700) Pulse Rate: 115 (06/25 1700)  Labs: Recent Labs    02/26/22 1046 02/26/22 1625 02/26/22 1710 02/26/22 2051 02/26/22 2258 02/26/22 2258 02/27/22 0624 02/27/22 0944 02/27/22 1240 02/28/22 0011 02/28/22 0943 02/28/22 1808  HGB 13.7   < > 10.5*  --   --   --  12.6*  --   --  10.2*  --   --   HCT 42.3   < > 31.0*  --   --   --  38.7*  --   --  32.7*  --   --   PLT 214  --   --   --   --   --  222  --   --  148*  --   --   APTT  --   --   --   --   --   --   --  64*  --   --   --   --   LABPROT  --   --   --   --   --   --   --  14.8  --   --   --   --   INR  --   --   --   --   --   --   --  1.2  --   --   --   --   HEPARINUNFRC  --   --   --   --  0.80*   < > 0.46  --    < > 0.53 0.10* 0.29*  CREATININE 1.11  --   --   --   --   --  2.86*  --   --  2.32*  --   --   TROPONINIHS  --   --   --  21* 14  --   --   --   --   --   --   --    < > = values in this interval not displayed.     Estimated Creatinine Clearance: 51.4 mL/min (A) (by C-G formula based on SCr of 2.32 mg/dL (H)).   Assessment: 46 yo male who is s/p LLE thrombectomy and fasciotomies. Patient on any oral anticoagulants. Pharmacy consulted to manage heparin.   -hg 12.6> 10.2 (fluid positive)  Heparin level came back slightly below goal tonight. We will increase slightly and check in AM.  Goal of Therapy:  Heparin level 0.3-0.7 units/ml Monitor platelets by anticoagulation protocol: Yes   Plan:  -Increase heparin to 1700 units/hr -Daily heparin level and CBC   Ulyses Southward, PharmD, BCIDP, AAHIVP, CPP Infectious Disease Pharmacist 02/28/2022 7:07 PM

## 2022-02-28 NOTE — Progress Notes (Signed)
ANTICOAGULATION CONSULT NOTE - Follow Up Consult  Pharmacy Consult for heparin Indication: DVT now s/p thrombectomy  Labs: Recent Labs    02/26/22 1046 02/26/22 1625 02/26/22 1710 02/26/22 2051 02/26/22 2258 02/26/22 2258 02/27/22 0624 02/27/22 0944 02/27/22 1240 02/27/22 1428 02/28/22 0011  HGB 13.7 9.9* 10.5*  --   --   --  12.6*  --   --   --   --   HCT 42.3 29.0* 31.0*  --   --   --  38.7*  --   --   --   --   PLT 214  --   --   --   --   --  222  --   --   --   --   APTT  --   --   --   --   --   --   --  64*  --   --   --   LABPROT  --   --   --   --   --   --   --  14.8  --   --   --   INR  --   --   --   --   --   --   --  1.2  --   --   --   HEPARINUNFRC  --   --   --   --  0.80*   < > 0.46  --  <0.10* 0.30 0.53  CREATININE 1.11  --   --   --   --   --  2.86*  --   --   --   --   TROPONINIHS  --   --   --  21* 14  --   --   --   --   --   --    < > = values in this interval not displayed.     Assessment/Plan:  46yo male therapeutic on heparin after rate change. Will continue infusion at current rate of 1600 units/hr and confirm stable with additional level.    Vernard Gambles, PharmD, BCPS  02/28/2022,1:07 AM

## 2022-03-01 ENCOUNTER — Other Ambulatory Visit (HOSPITAL_COMMUNITY): Payer: Self-pay

## 2022-03-01 DIAGNOSIS — I998 Other disorder of circulatory system: Secondary | ICD-10-CM | POA: Diagnosis not present

## 2022-03-01 LAB — URINE CULTURE: Culture: 20000 — AB

## 2022-03-01 LAB — BASIC METABOLIC PANEL
Anion gap: 7 (ref 5–15)
BUN: 16 mg/dL (ref 6–20)
CO2: 24 mmol/L (ref 22–32)
Calcium: 8.1 mg/dL — ABNORMAL LOW (ref 8.9–10.3)
Chloride: 101 mmol/L (ref 98–111)
Creatinine, Ser: 1.2 mg/dL (ref 0.61–1.24)
GFR, Estimated: >60 mL/min (ref >60)
Glucose, Bld: 265 mg/dL — ABNORMAL HIGH (ref 70–99)
Potassium: 4.9 mmol/L (ref 3.5–5.1)
Sodium: 132 mmol/L — ABNORMAL LOW (ref 135–145)

## 2022-03-01 LAB — HEPARIN LEVEL (UNFRACTIONATED)
Heparin Unfractionated: 0.28 IU/mL — ABNORMAL LOW (ref 0.30–0.70)
Heparin Unfractionated: 0.28 IU/mL — ABNORMAL LOW (ref 0.30–0.70)
Heparin Unfractionated: 0.31 IU/mL (ref 0.30–0.70)

## 2022-03-01 LAB — CBC
HCT: 29.9 % — ABNORMAL LOW (ref 39.0–52.0)
Hemoglobin: 9.2 g/dL — ABNORMAL LOW (ref 13.0–17.0)
MCH: 29 pg (ref 26.0–34.0)
MCHC: 30.8 g/dL (ref 30.0–36.0)
MCV: 94.3 fL (ref 80.0–100.0)
Platelets: 134 10*3/uL — ABNORMAL LOW (ref 150–400)
RBC: 3.17 MIL/uL — ABNORMAL LOW (ref 4.22–5.81)
RDW: 13.5 % (ref 11.5–15.5)
WBC: 13.9 10*3/uL — ABNORMAL HIGH (ref 4.0–10.5)
nRBC: 0.1 % (ref 0.0–0.2)

## 2022-03-01 LAB — GLUCOSE, CAPILLARY
Glucose-Capillary: 328 mg/dL — ABNORMAL HIGH (ref 70–99)
Glucose-Capillary: 230 mg/dL — ABNORMAL HIGH (ref 70–99)
Glucose-Capillary: 383 mg/dL — ABNORMAL HIGH (ref 70–99)
Glucose-Capillary: 300 mg/dL — ABNORMAL HIGH (ref 70–99)

## 2022-03-01 LAB — ANA W/REFLEX IF POSITIVE: Anti Nuclear Antibody (ANA): NEGATIVE

## 2022-03-01 MED ORDER — INSULIN ASPART 100 UNIT/ML IJ SOLN
3.0000 [IU] | Freq: Three times a day (TID) | INTRAMUSCULAR | Status: DC
  Administered 2022-03-01 – 2022-03-02 (×4): 3 [IU] via SUBCUTANEOUS

## 2022-03-01 MED ORDER — CEFEPIME HCL 2 G IV SOLR
2.0000 g | Freq: Three times a day (TID) | INTRAVENOUS | Status: DC
Start: 2022-03-01 — End: 2022-03-02
  Administered 2022-03-01 – 2022-03-02 (×4): 2 g via INTRAVENOUS
  Filled 2022-03-01 (×4): qty 12.5

## 2022-03-01 MED ORDER — HYDRALAZINE HCL 20 MG/ML IJ SOLN: 10.0000 mg | INTRAMUSCULAR | Status: DC | PRN

## 2022-03-01 MED ORDER — VANCOMYCIN HCL IN DEXTROSE 1-5 GM/200ML-% IV SOLN
1000.0000 mg | Freq: Two times a day (BID) | INTRAVENOUS | Status: AC
  Administered 2022-03-01 – 2022-03-03 (×6): 1000 mg via INTRAVENOUS
  Filled 2022-03-01 (×6): qty 200

## 2022-03-01 MED ORDER — INSULIN STARTER KIT- PEN NEEDLES (ENGLISH)
1.0000 | Freq: Once | Status: AC
  Administered 2022-03-01: 1
  Filled 2022-03-01: qty 1

## 2022-03-01 MED ORDER — IPRATROPIUM-ALBUTEROL 0.5-2.5 (3) MG/3ML IN SOLN
3.0000 mL | Freq: Two times a day (BID) | RESPIRATORY_TRACT | Status: DC
  Administered 2022-03-01 – 2022-03-02 (×2): 3 mL via RESPIRATORY_TRACT
  Filled 2022-03-01 (×4): qty 3

## 2022-03-01 MED ORDER — METOPROLOL TARTRATE 5 MG/5ML IV SOLN: 5.0000 mg | INTRAVENOUS | Status: DC | PRN

## 2022-03-01 MED ORDER — TRAZODONE HCL 50 MG PO TABS: 50.0000 mg | ORAL_TABLET | Freq: Every evening | ORAL | Status: DC | PRN

## 2022-03-01 MED ORDER — IPRATROPIUM-ALBUTEROL 0.5-2.5 (3) MG/3ML IN SOLN
3.0000 mL | RESPIRATORY_TRACT | Status: DC | PRN
  Administered 2022-03-01: 3 mL via RESPIRATORY_TRACT

## 2022-03-01 MED ORDER — GUAIFENESIN 100 MG/5ML PO LIQD: 5.0000 mL | ORAL | Status: DC | PRN

## 2022-03-01 MED ORDER — INSULIN GLARGINE-YFGN 100 UNIT/ML ~~LOC~~ SOLN
20.0000 [IU] | Freq: Two times a day (BID) | SUBCUTANEOUS | Status: DC
  Administered 2022-03-01 – 2022-03-08 (×15): 20 [IU] via SUBCUTANEOUS
  Filled 2022-03-01 (×16): qty 0.2

## 2022-03-01 NOTE — TOC Benefit Eligibility Note (Signed)
Patient Product/process development scientist completed.    The patient is currently admitted and upon discharge could be taking Social research officer, government.  The current 30 day co-pay is, $0.00.   The patient is currently admitted and upon discharge could be taking Levemir Pens.  The current 30 day co-pay is, $0.00.   The patient is currently admitted and upon discharge could be taking Novolog Pens.  The current 30 day co-pay is, $0.00.   The patient is currently admitted and upon discharge could be taking Humalog Pens.  Non Formulary  The patient is currently admitted and upon discharge could be taking Lantus Pens.  Non Formulary  The patient is currently admitted and upon discharge could be taking Semglee Pens.  Non Formulary  The patient is insured through [employer] & [employer] Medicaid    Roland Earl, CPhT Pharmacy Patient Advocate Specialist [facility] Patient Advocate Team Direct Number: [phone]  Fax: [phone]

## 2022-03-01 NOTE — Progress Notes (Signed)
ANTICOAGULATION CONSULT NOTE  Pharmacy Consult for Heparin Indication: DVT  No Known Allergies  Patient Measurements: Height: 5\' 8"  (172.7 cm) Weight: 131 kg (288 lb 12.8 oz) IBW/kg (Calculated) : 68.4  Heparin Dosing Weight: 97 kg  Vital Signs: Temp: 97.8 F (36.6 C) (06/26 0737) Temp Source: Oral (06/26 0737) BP: 129/65 (06/26 1233) Pulse Rate: 100 (06/26 1233)  Labs: Recent Labs    02/26/22 2051 02/26/22 2258 02/26/22 2258 02/27/22 2831 02/27/22 0944 02/27/22 1240 02/28/22 0011 02/28/22 0943 02/28/22 1808 03/01/22 0224 03/01/22 1043  HGB  --   --   --  12.6*  --   --  10.2*  --   --  9.2*  --   HCT  --   --   --  38.7*  --   --  32.7*  --   --  29.9*  --   PLT  --   --   --  222  --   --  148*  --   --  134*  --   APTT  --   --   --   --  64*  --   --   --   --   --   --   LABPROT  --   --   --   --  14.8  --   --   --   --   --   --   INR  --   --   --   --  1.2  --   --   --   --   --   --   HEPARINUNFRC  --  0.80*   < > 0.46  --    < > 0.53   < > 0.29* 0.28* 0.28*  CREATININE  --   --   --  2.86*  --   --  2.32*  --   --  1.20  --   TROPONINIHS 21* 14  --   --   --   --   --   --   --   --   --    < > = values in this interval not displayed.    Estimated Creatinine Clearance: 99.5 mL/min (by C-G formula based on SCr of 1.2 mg/dL).   Assessment: 46 yo male who is s/p LLE thrombectomy and fasciotomies. Patient on any oral anticoagulants. Pharmacy consulted to manage heparin.   Heparin level had no change despite rate increase; no infusion issues or signs of bleeding per RN. OF note Hgb and Plt are still trending down (Hgb now 9.2, platelets 134). Remains subtherapeutic 0.28 on 1900 units/hr.    Goal of Therapy:  Heparin level 0.3-0.7 units/ml Monitor platelets by anticoagulation protocol: Yes   Plan:  Increase heparin infusion to 2000 units/hr Check heparin level in 6 hours and daily while on heparin Continue to monitor H&H and platelets    Thank  you for allowing pharmacy to be a part of this patient's care.  Thelma Barge, PharmD Clinical Pharmacist

## 2022-03-01 NOTE — Progress Notes (Addendum)
[facility]  Subjective  -states his left foot is still swollen and pain on the top of the foot.   Objective (!) 109/58 98 97.8 F (36.6 C) (Oral) 16 100%  Intake/Output Summary (Last 24 hours) at 03/01/2022 0835 Last data filed at 03/01/2022 0738 Gross per 24 hour  Intake 3431.09 ml  Output 2700 ml  Net 731.09 ml    Left groin clean dry and intact Left peroneal triphasic and brisk at the ankle Left AT monophasic Left foot viable Left leg fasciotomies closed with staples  Right PT triphasic  Laboratory Lab Results: Recent Labs    02/28/22 0011 03/01/22 0224  WBC 14.2* 13.9*  HGB 10.2* 9.2*  HCT 32.7* 29.9*  PLT 148* 134*   BMET Recent Labs    02/28/22 0011 03/01/22 0224  NA 135 132*  K 4.4 4.9  CL 102 101  CO2 20* 24  GLUCOSE 164* 265*  BUN 31* 16  CREATININE 2.32* 1.20  CALCIUM 7.7* 8.1*    COAG Lab Results  Component Value Date   INR 1.2 02/27/2022   INR 1.1 11/17/2021   No results found for: "PTT"  Assessment/Planning:  Postop day 3 status post left iliac and left lower extremity thrombectomy with left leg fasciotomies for acute limb ischemia that appeared embolic.  Over the weekend was transferred to the ICU for hypotension and fever with concern for sepsis.  Now back on the floor.  Calf remained soft with fasciotomies closed.  He has a brisk triphasic peroneal signal at the left ankle and only had peroneal runoff based on angio in the OR.  Continue heparin from my standpoint.  He has narcotics ordered as needed.  Can work on mobility from my standpoint.  AKI improving.    Cephus Shelling 03/01/2022 8:35 AM --

## 2022-03-02 DIAGNOSIS — I998 Other disorder of circulatory system: Secondary | ICD-10-CM | POA: Diagnosis not present

## 2022-03-02 LAB — BASIC METABOLIC PANEL
Anion gap: 9 (ref 5–15)
BUN: 20 mg/dL (ref 6–20)
CO2: 23 mmol/L (ref 22–32)
Calcium: 8.2 mg/dL — ABNORMAL LOW (ref 8.9–10.3)
Chloride: 102 mmol/L (ref 98–111)
Creatinine, Ser: 1.11 mg/dL (ref 0.61–1.24)
GFR, Estimated: >60 mL/min (ref >60)
Glucose, Bld: 205 mg/dL — ABNORMAL HIGH (ref 70–99)
Potassium: 4.5 mmol/L (ref 3.5–5.1)
Sodium: 134 mmol/L — ABNORMAL LOW (ref 135–145)

## 2022-03-02 LAB — CBC
HCT: 27.8 % — ABNORMAL LOW (ref 39.0–52.0)
Hemoglobin: 8.9 g/dL — ABNORMAL LOW (ref 13.0–17.0)
MCH: 29.9 pg (ref 26.0–34.0)
MCHC: 32 g/dL (ref 30.0–36.0)
MCV: 93.3 fL (ref 80.0–100.0)
Platelets: 130 10*3/uL — ABNORMAL LOW (ref 150–400)
RBC: 2.98 MIL/uL — ABNORMAL LOW (ref 4.22–5.81)
RDW: 13.4 % (ref 11.5–15.5)
WBC: 13.9 10*3/uL — ABNORMAL HIGH (ref 4.0–10.5)
nRBC: 0.3 % — ABNORMAL HIGH (ref 0.0–0.2)

## 2022-03-02 LAB — GLUCOSE, CAPILLARY
Glucose-Capillary: 146 mg/dL — ABNORMAL HIGH (ref 70–99)
Glucose-Capillary: 172 mg/dL — ABNORMAL HIGH (ref 70–99)
Glucose-Capillary: 414 mg/dL — ABNORMAL HIGH (ref 70–99)
Glucose-Capillary: 310 mg/dL — ABNORMAL HIGH (ref 70–99)

## 2022-03-02 LAB — HEPARIN LEVEL (UNFRACTIONATED): Heparin Unfractionated: 0.34 IU/mL (ref 0.30–0.70)

## 2022-03-02 LAB — SURGICAL PATHOLOGY

## 2022-03-02 LAB — MAGNESIUM: Magnesium: 2.3 mg/dL (ref 1.7–2.4)

## 2022-03-02 MED ORDER — INSULIN ASPART 100 UNIT/ML IJ SOLN
8.0000 [IU] | Freq: Three times a day (TID) | INTRAMUSCULAR | Status: DC
  Administered 2022-03-02: 5 [IU] via SUBCUTANEOUS
  Administered 2022-03-03 – 2022-03-07 (×15): 8 [IU] via SUBCUTANEOUS

## 2022-03-02 NOTE — Progress Notes (Signed)
ANTICOAGULATION CONSULT NOTE - Follow Up Consult  Pharmacy Consult for heparin Indication: DVT  Labs: Recent Labs    02/27/22 0624 02/27/22 0944 02/27/22 1240 02/28/22 0011 02/28/22 0943 03/01/22 0224 03/01/22 1043 03/01/22 2320  HGB 12.6*  --   --  10.2*  --  9.2*  --   --   HCT 38.7*  --   --  32.7*  --  29.9*  --   --   PLT 222  --   --  148*  --  134*  --   --   APTT  --  64*  --   --   --   --   --   --   LABPROT  --  14.8  --   --   --   --   --   --   INR  --  1.2  --   --   --   --   --   --   HEPARINUNFRC 0.46  --    < > 0.53   < > 0.28* 0.28* 0.31  CREATININE 2.86*  --   --  2.32*  --  1.20  --   --    < > = values in this interval not displayed.     Assessment/Plan:  46yo male therapeutic on heparin after rate change. Will continue infusion at current rate of 1900 units/hr and confirm stable with additional level.   Vernard Gambles, PharmD, BCPS  03/02/2022,12:55 AM

## 2022-03-02 NOTE — Evaluation (Signed)
Occupational Therapy Evaluation Patient Details Name: James Hartman MRN: [id number] DOB: September 27, 1975 Today's Date: 03/02/2022   History of Present Illness James Hartman is a 46 y.o. male with medical history significant of HTN, T2DM, morbid obesity, extensive sarcoidosis including neurosarcoidosis, granulomatous lymphadenitis  who presented to [facility] ED for one week history of bilateral lower leg pain, swelling and weakness, but>on the left. He states pain was so bad eh couldn't bear weight today. Also feels like his left lower extremity was cooler to touch. Patient is s/p L iliac thrombecotomy, L fasciotomies 02/26/22. Went to ICU over weekend due to possible septic shock.   Clinical Impression   Pt at [facility] reported they were using a cane and then ended up needing a WC. Pt was shown AE on how to complete LE dressing/bathing to increase in ability to complete with the return to home. Pt at this time attempted two sit to stand transfers from lower surface with min to min guard assistance but then was limited to less then 1 min standing tolerance at this time.  Pt required cues on breathing techniques throughout session on RA went into 88% but was able to recover into 90%. Will continue to follow.      Recommendations for follow up therapy are one component of a multi-disciplinary discharge planning process, led by the attending physician.  Recommendations may be updated based on patient status, additional functional criteria and insurance authorization.   Follow Up Recommendations  Home health OT    Assistance Recommended at Discharge Frequent or constant Supervision/Assistance  Patient can return home with the following A lot of help with walking and/or transfers;A lot of help with bathing/dressing/bathroom;Assist for transportation;Assistance with cooking/housework    Functional Status Assessment  Patient has had a recent decline in their functional status and demonstrates the ability to make  significant improvements in function in a reasonable and predictable amount of time.  Equipment Recommendations  None recommended by OT    Recommendations for Other Services       Precautions / Restrictions Precautions Precautions: Fall Restrictions Weight Bearing Restrictions: No      Mobility Bed Mobility Overal bed mobility:  (presented in chair)                  Transfers Overall transfer level: Needs assistance Equipment used: Rolling walker (2 wheels) Transfers: Sit to/from Stand Sit to Stand: Min assist, Mod assist                  Balance Overall balance assessment: Needs assistance Sitting-balance support: Feet supported Sitting balance-Leahy Scale: Good     Standing balance support: Bilateral upper extremity supported, Reliant on assistive device for balance Standing balance-Leahy Scale: Poor                             ADL either performed or assessed with clinical judgement   ADL Overall ADL's : Needs assistance/impaired Eating/Feeding: Independent;Sitting   Grooming: Wash/dry hands;Wash/dry face;Set up;Sitting   Upper Body Bathing: Set up;Sitting   Lower Body Bathing: Maximal assistance;Sit to/from stand   Upper Body Dressing : Set up;Sitting   Lower Body Dressing: Maximal assistance;Cueing for safety;Cueing for sequencing;Sit to/from stand   Toilet Transfer: Minimal assistance;Cueing for safety;Cueing for sequencing;Rolling walker (2 wheels)   Toileting- Clothing Manipulation and Hygiene: Maximal assistance;Cueing for safety;Cueing for sequencing;Sit to/from stand         General ADL Comments: Pt at this time was able  to complete sit to stand transfer but due to increase in pain and SOB required to sit back down     Vision         Perception     Praxis      Pertinent Vitals/Pain Pain Assessment Pain Assessment: Faces Faces Pain Scale: Hurts little more Pain Location: L LE Pain Descriptors / Indicators:  Discomfort, Grimacing, Sore Pain Intervention(s): Limited activity within patient's tolerance, Monitored during session     Hand Dominance     Extremity/Trunk Assessment Upper Extremity Assessment Upper Extremity Assessment: Generalized weakness   Lower Extremity Assessment Lower Extremity Assessment: Defer to PT evaluation   Cervical / Trunk Assessment Cervical / Trunk Assessment: Normal   Communication Communication Communication: No difficulties   Cognition Arousal/Alertness: Awake/alert Behavior During Therapy: WFL for tasks assessed/performed Overall Cognitive Status: Within Functional Limits for tasks assessed                                       General Comments       Exercises     Shoulder Instructions      Home Living Family/patient expects to be discharged to:: Private residence Living Arrangements: Children;Spouse/significant other Available Help at Discharge: Family;Available PRN/intermittently Type of Home: House Home Access: Stairs to enter [employer] of Steps: 1-2 Entrance Stairs-Rails: None Home Layout: One level     Bathroom Shower/Tub: Chief Strategy Officer: Standard Bathroom Accessibility: Yes   Home Equipment: Agricultural consultant (2 wheels);BSC/3in1;Crutches;Wheelchair - manual (slidding transfer bench)          Prior Functioning/Environment Prior Level of Function : Independent/Modified Independent             Mobility Comments: recently using rolling walker, w/c and BSC due to pain in left LE. Drives truck for work at baseline ADLs Comments: mod independent        OT Problem List: Decreased strength;Decreased activity tolerance;Impaired balance (sitting and/or standing);Decreased safety awareness;Decreased knowledge of use of DME or AE;Cardiopulmonary status limiting activity;Pain      OT Treatment/Interventions: Self-care/ADL training;Energy conservation;DME and/or AE  instruction;Therapeutic activities;Patient/family education;Balance training    OT Goals(Current goals can be found in the care plan section) Acute Rehab OT Goals Patient Stated Goal: to get stronger OT Goal Formulation: With patient Time For Goal Achievement: 03/16/22 Potential to Achieve Goals: Good ADL Goals Pt Will Perform Upper Body Bathing: with modified independence;sitting Pt Will Perform Lower Body Bathing: with set-up;with adaptive equipment;sit to/from stand Pt Will Transfer to Toilet: with min assist;ambulating;bedside commode Pt Will Perform Tub/Shower Transfer: with min assist;ambulating;tub bench  OT Frequency: Min 2X/week    Co-evaluation              AM-PAC OT "6 Clicks" Daily Activity     Outcome Measure Help from another person eating meals?: None Help from another person taking care of personal grooming?: A Little Help from another person toileting, which includes using toliet, bedpan, or urinal?: A Lot Help from another person bathing (including washing, rinsing, drying)?: A Lot Help from another person to put on and taking off regular upper body clothing?: A Little Help from another person to put on and taking off regular lower body clothing?: A Lot 6 Click Score: 16   End of Session Equipment Utilized During Treatment: Gait belt;Rolling walker (2 wheels) Nurse Communication: Mobility status (o2)  Activity Tolerance: Patient limited by fatigue Patient left:  in chair;with call bell/phone within reach;with chair alarm set  OT Visit Diagnosis: Unsteadiness on feet (R26.81);Other abnormalities of gait and mobility (R26.89);Muscle weakness (generalized) (M62.81);Pain Pain - part of body: Leg                Time: 0922-0952 OT Time Calculation (min): 30 min Charges:  OT General Charges $OT Visit: 1 Visit OT Evaluation $OT Eval Low Complexity: 1 Low OT Treatments $Self Care/Home Management : 8-22 mins  Alphia Moh OTR/L  Acute Rehab Services   [phone] office number [phone] pager number   Alphia Moh 03/02/2022, 11:22 AM

## 2022-03-03 ENCOUNTER — Other Ambulatory Visit (HOSPITAL_COMMUNITY): Payer: Self-pay

## 2022-03-03 ENCOUNTER — Inpatient Hospital Stay (HOSPITAL_COMMUNITY): Payer: No Typology Code available for payment source

## 2022-03-03 DIAGNOSIS — I998 Other disorder of circulatory system: Secondary | ICD-10-CM | POA: Diagnosis not present

## 2022-03-03 LAB — BASIC METABOLIC PANEL
Anion gap: 13 (ref 5–15)
BUN: 22 mg/dL — ABNORMAL HIGH (ref 6–20)
CO2: 22 mmol/L (ref 22–32)
Calcium: 8.6 mg/dL — ABNORMAL LOW (ref 8.9–10.3)
Chloride: 103 mmol/L (ref 98–111)
Creatinine, Ser: 1.11 mg/dL (ref 0.61–1.24)
GFR, Estimated: >60 mL/min (ref >60)
Glucose, Bld: 148 mg/dL — ABNORMAL HIGH (ref 70–99)
Potassium: 4.6 mmol/L (ref 3.5–5.1)
Sodium: 138 mmol/L (ref 135–145)

## 2022-03-03 LAB — CBC
HCT: 28.3 % — ABNORMAL LOW (ref 39.0–52.0)
Hemoglobin: 9 g/dL — ABNORMAL LOW (ref 13.0–17.0)
MCH: 29.5 pg (ref 26.0–34.0)
MCHC: 31.8 g/dL (ref 30.0–36.0)
MCV: 92.8 fL (ref 80.0–100.0)
Platelets: 170 10*3/uL (ref 150–400)
RBC: 3.05 MIL/uL — ABNORMAL LOW (ref 4.22–5.81)
RDW: 13.8 % (ref 11.5–15.5)
WBC: 15.4 10*3/uL — ABNORMAL HIGH (ref 4.0–10.5)
nRBC: 0.6 % — ABNORMAL HIGH (ref 0.0–0.2)

## 2022-03-03 LAB — GLUCOSE, CAPILLARY
Glucose-Capillary: 128 mg/dL — ABNORMAL HIGH (ref 70–99)
Glucose-Capillary: 257 mg/dL — ABNORMAL HIGH (ref 70–99)
Glucose-Capillary: 300 mg/dL — ABNORMAL HIGH (ref 70–99)
Glucose-Capillary: 313 mg/dL — ABNORMAL HIGH (ref 70–99)

## 2022-03-03 LAB — MAGNESIUM: Magnesium: 2.4 mg/dL (ref 1.7–2.4)

## 2022-03-03 LAB — HEPARIN LEVEL (UNFRACTIONATED): Heparin Unfractionated: 0.33 IU/mL (ref 0.30–0.70)

## 2022-03-03 LAB — BRAIN NATRIURETIC PEPTIDE: B Natriuretic Peptide: 242.2 pg/mL — ABNORMAL HIGH (ref 0.0–100.0)

## 2022-03-03 LAB — TSH: TSH: 0.744 u[IU]/mL (ref 0.350–4.500)

## 2022-03-03 MED ORDER — FUROSEMIDE 10 MG/ML IJ SOLN: 40.0000 mg | Freq: Two times a day (BID) | INTRAMUSCULAR | Status: DC

## 2022-03-03 MED ORDER — FUROSEMIDE 10 MG/ML IJ SOLN
60.0000 mg | Freq: Once | INTRAMUSCULAR | Status: AC
Start: 2022-03-03 — End: 2022-03-03
  Administered 2022-03-03: 60 mg via INTRAVENOUS
  Filled 2022-03-03: qty 6

## 2022-03-03 MED ORDER — APIXABAN 5 MG PO TABS
10.0000 mg | ORAL_TABLET | Freq: Two times a day (BID) | ORAL | Status: DC
  Administered 2022-03-03 – 2022-03-08 (×11): 10 mg via ORAL
  Filled 2022-03-03 (×11): qty 2

## 2022-03-03 MED ORDER — APIXABAN (ELIQUIS) EDUCATION KIT FOR DVT/PE PATIENTS
PACK | Freq: Once | Status: AC
  Filled 2022-03-03: qty 1

## 2022-03-03 MED ORDER — APIXABAN 5 MG PO TABS: 5.0000 mg | ORAL_TABLET | Freq: Two times a day (BID) | ORAL | Status: DC

## 2022-03-03 MED ORDER — METOPROLOL TARTRATE 25 MG PO TABS
25.0000 mg | ORAL_TABLET | Freq: Two times a day (BID) | ORAL | Status: DC
Start: 2022-03-03 — End: 2022-03-05
  Administered 2022-03-03 – 2022-03-04 (×4): 25 mg via ORAL
  Filled 2022-03-03 (×4): qty 1

## 2022-03-03 MED ORDER — GABAPENTIN 300 MG PO CAPS
900.0000 mg | ORAL_CAPSULE | Freq: Three times a day (TID) | ORAL | Status: DC
  Administered 2022-03-03 – 2022-03-08 (×13): 900 mg via ORAL
  Filled 2022-03-03 (×13): qty 3

## 2022-03-03 NOTE — Progress Notes (Signed)
PROGRESS NOTE  James Hartman  ZOX:[id number] DOB: 02/22/1976 DOA: 02/26/2022 PCP: [facility]   Brief Narrative: Patient is a 46 year old male with history of hypertension, diabetes type 2, morbid obesity, neurosarcoidosis versus granulomatous lymphadenitis who initially presented at [facility] hospital with complaint of bilateral lower extremity pain, swelling.  He was found to have left leg arterial clot with concern for limb ischemia and was transferred to [facility].  Status post thrombectomy/fasciotomy of left leg.  He was on heparin drip.  Hospital course remarkable for postoperative hypotension, fever, had to be transferred to ICU on pressors.  Also started on pressors, antibiotics.  Currently hemodynamically stable but has persistent sinus tachycardia.  Now nearing discharge date..  Plan to discharge tomorrow to home if improvement in leukocytosis, sinus tachycardia.  Assessment & Plan:  Principal Problem:   Acute lower limb ischemia with multifocal high grade stenosis or occlusion indicative of showering emboli  Active Problems:   Septic shock (HCC)   AKI (acute kidney injury) (HCC)   Acute pulmonary embolism and right LE DVT    Leukocytosis   Type 2 diabetes mellitus (HCC)   Neurosarcoidosis   Essential hypertension   Moderate persistent asthma   Normocytic anemia   Mixed hyperlipidemia   Obstructive sleep apnea   Granulomatous lymphadenitis   DVT (deep venous thrombosis) (HCC)  Acute lower extremity ischemia with multifocal high-grade stenosis/occlusion indicative of scarring emboli on the left: Vascular surgery was consulted.  Status post thrombectomy, fasciotomy.  Vascular surgery cleared for discharge.  He was on heparin drip, being transitioned to Eliquis.  He will follow-up with vascular surgery in 2 to 3 weeks for suture removal/follow-up  AKI: Creatinine peaked at 2.8 likely from ATN/hypotension.  Currently stable.  Septic shock/UTI: Required  pressors while in ICU.  Currently off pressors, blood pressure stable.  Afebrile.  Still has leukocytosis.  Urine culture showed Enterococcus, on vancomycin.  Acute PE/right DVT: Incidental finding on CT angio, associated with right lower extremity DVT.  On heparin drip, now on Eliquis  Diabetes type 2/hyperglycemia: Hemoglobin A1c of 10.4 diarrhea coronary was following.  Takes glipizide, metformin at home.  Currently on insulin and sliding scale.  He needs insulin on discharge.  Neurosarcoidosis: Follows at [facility].  On prednisone 60 mg daily.  On outpatient PCP prophylaxis with Bactrim, follows with Dr. Algis Liming  Hypertension: Hospital course remarkable for hypertension.  On Diovan and hydrochlorothiazide at home, currently on hold  Sinus tachycardia: Unclear etiology.  TSH will be followed.  Recent echo showed EF of more than 75%, grade 1 diastolic dysfunction.  We will continue to monitor on telemetry.  We will add metoprolol if needed  Moderate persistent asthma: Continue bronchodilators.  Currently on room air  Normocytic anemia: Currently hemoglobin stable  Hyperlipidemia: On statin  History of OSA: On CPAP at night  Morbid obesity: BMI 45.3          DVT prophylaxis: apixaban (ELIQUIS) tablet 10 mg  apixaban (ELIQUIS) tablet 5 mg     Code Status: Full Code  Family Communication: None at the bedside  Patient status: Inpatient  Patient is from : Home  Anticipated discharge to: Home  Estimated DC date: Tomorrow   Consultants: Vascular surgery  Procedures: As above  Antimicrobials:  Anti-infectives (From admission, onward)    Start     Dose/Rate Route Frequency Ordered Stop   03/01/22 0800  ceFEPIme (MAXIPIME) 2 g in sodium chloride 0.9 % 100 mL IVPB  Status:  Discontinued  2 g 200 mL/hr over 30 Minutes Intravenous Every 8 hours 03/01/22 0734 03/02/22 1135   03/01/22 0800  vancomycin (VANCOCIN) IVPB 1000 mg/200 mL premix        1,000 mg 200 mL/hr over 60  Minutes Intravenous Every 12 hours 03/01/22 0740 03/03/22 2359   02/28/22 1515  vancomycin (VANCOREADY) IVPB 1250 mg/250 mL  Status:  Discontinued        1,250 mg 166.7 mL/hr over 90 Minutes Intravenous Every 24 hours 02/28/22 1421 03/01/22 0740   02/28/22 1400  vancomycin (VANCOCIN) IVPB 1000 mg/200 mL premix  Status:  Discontinued        1,000 mg 200 mL/hr over 60 Minutes Intravenous Every 24 hours 02/27/22 1245 02/28/22 0809   02/28/22 1000  sulfamethoxazole-trimethoprim (BACTRIM DS) 800-160 MG per tablet 1 tablet        1 tablet Oral Daily 02/28/22 0819     02/27/22 2200  vancomycin (VANCOCIN) IVPB 1000 mg/200 mL premix  Status:  Discontinued        1,000 mg 200 mL/hr over 60 Minutes Intravenous Every 24 hours 02/27/22 1003 02/27/22 1245   02/27/22 1330  vancomycin (VANCOREADY) IVPB 2000 mg/400 mL        2,000 mg 200 mL/hr over 120 Minutes Intravenous  Once 02/27/22 1245 02/27/22 1458   02/27/22 1100  vancomycin (VANCOREADY) IVPB 2000 mg/400 mL  Status:  Discontinued        2,000 mg 200 mL/hr over 120 Minutes Intravenous  Once 02/27/22 1000 02/27/22 1245   02/27/22 1030  ceFEPIme (MAXIPIME) 2 g in sodium chloride 0.9 % 100 mL IVPB  Status:  Discontinued        2 g 200 mL/hr over 30 Minutes Intravenous Every 12 hours 02/27/22 0958 03/01/22 0734   02/27/22 1015  ceFEPIme (MAXIPIME) 2 g in sodium chloride 0.9 % 100 mL IVPB  Status:  Discontinued        2 g 200 mL/hr over 30 Minutes Intravenous  Once 02/27/22 0925 02/27/22 0958   02/27/22 1015  metroNIDAZOLE (FLAGYL) IVPB 500 mg  Status:  Discontinued        500 mg 100 mL/hr over 60 Minutes Intravenous Every 12 hours 02/27/22 0925 03/02/22 1135   02/27/22 1015  vancomycin (VANCOCIN) IVPB 1000 mg/200 mL premix  Status:  Discontinued        1,000 mg 200 mL/hr over 60 Minutes Intravenous  Once 02/27/22 0925 02/27/22 0956   02/26/22 1435  ceFAZolin (ANCEF) 2-4 GM/100ML-% IVPB       Note to Pharmacy: Cameron Sprang M: cabinet override       02/26/22 1435 02/27/22 0244   02/26/22 1433  ceFAZolin (ANCEF) 3-0.9 GM/100ML-% IVPB       Note to Pharmacy: Valda Lamb J: cabinet override      02/26/22 1433 02/27/22 0244       Subjective:  Patient seen and examined at the bedside this morning.  He was in sinus tachycardia with heart rate ranging from 110-120.  Denies chest pain or shortness of breath.  On room air.  Eager to go home Objective: Vitals:   03/02/22 2350 03/03/22 0056 03/03/22 0312 03/03/22 0818  BP:   121/71 (!) 159/80  Pulse: (!) 106 (!) 105 (!) 108 (!) 109  Resp: 19 19 20 20   Temp:   99.1 F (37.3 C) 98.5 F (36.9 C)  TempSrc:   Oral Oral  SpO2:  96% 92% 91%  Weight:   135.3 kg   Height:  Intake/Output Summary (Last 24 hours) at 03/03/2022 1037 Last data filed at 03/03/2022 0800 Gross per 24 hour  Intake 718.06 ml  Output 1900 ml  Net -1181.94 ml   Filed Weights   02/26/22 1012 02/26/22 2019 03/03/22 0312  Weight: 131.5 kg 131 kg 135.3 kg    Examination:  General exam: Overall comfortable, not in distress, morbidly obese HEENT: PERRL Respiratory system:  no wheezes or crackles  Cardiovascular system: S1 & S2 heard, RRR.  Sinus tachycardia Gastrointestinal system: Abdomen is nondistended, soft and nontender. Central nervous system: Alert and oriented Extremities: Trace edema on bilateral lower extremities, sutures on the left lower extremity, no clubbing ,no cyanosis Skin: No rashes, no ulcers,no icterus     Data Reviewed: I have personally reviewed following labs and imaging studies  CBC: Recent Labs  Lab 02/26/22 1046 02/26/22 1625 02/27/22 0624 02/28/22 0011 03/01/22 0224 03/02/22 0130 03/03/22 0104  WBC 16.2*  --  13.8* 14.2* 13.9* 13.9* 15.4*  NEUTROABS 14.3*  --   --   --   --   --   --   HGB 13.7   < > 12.6* 10.2* 9.2* 8.9* 9.0*  HCT 42.3   < > 38.7* 32.7* 29.9* 27.8* 28.3*  MCV 90.4  --  92.8 94.8 94.3 93.3 92.8  PLT 214  --  222 148* 134* 130* 170   < > = values  in this interval not displayed.   Basic Metabolic Panel: Recent Labs  Lab 02/27/22 0624 02/28/22 0011 03/01/22 0224 03/02/22 0130 03/03/22 0104  NA 139 135 132* 134* 138  K 5.0 4.4 4.9 4.5 4.6  CL 100 102 101 102 103  CO2 28 20* 24 23 22   GLUCOSE 185* 164* 265* 205* 148*  BUN 19 31* 16 20 22*  CREATININE 2.86* 2.32* 1.20 1.11 1.11  CALCIUM 8.3* 7.7* 8.1* 8.2* 8.6*  MG  --  2.0  --  2.3 2.4  PHOS  --  6.3*  --   --   --      Recent Results (from the past 240 hour(s))  Culture, blood (x 2)     Status: None (Preliminary result)   Collection Time: 02/27/22  9:39 AM   Specimen: BLOOD  Result Value Ref Range Status   Specimen Description BLOOD RIGHT ANTECUBITAL  Final   Special Requests   Final    BOTTLES DRAWN AEROBIC AND ANAEROBIC Blood Culture adequate volume   Culture   Final    NO GROWTH 4 DAYS Performed at [facility], [address], [place], [place]    Report Status PENDING  Incomplete  Culture, blood (x 2)     Status: None (Preliminary result)   Collection Time: 02/27/22  9:53 AM   Specimen: BLOOD  Result Value Ref Range Status   Specimen Description BLOOD RIGHT ANTECUBITAL  Final   Special Requests   Final    BOTTLES DRAWN AEROBIC AND ANAEROBIC Blood Culture adequate volume   Culture   Final    NO GROWTH 4 DAYS Performed at [facility], [address], [place], [place]    Report Status PENDING  Incomplete  MRSA Next Gen by PCR, Nasal     Status: None   Collection Time: 02/27/22 12:04 PM   Specimen: Nasal Mucosa; Nasal Swab  Result Value Ref Range Status   MRSA by PCR Next Gen NOT DETECTED NOT DETECTED Final    Comment: (NOTE) The GeneXpert MRSA Assay (FDA approved  for NASAL specimens only), is one component of a comprehensive MRSA colonization surveillance program. It is not intended to diagnose MRSA infection nor to guide or monitor treatment for MRSA infections. Test performance is not FDA approved in patients less than  73 years old. Performed at [facility], [place] [address], [place], [place] [address]   Urine Culture     Status: Abnormal   Collection Time: 02/27/22 12:45 PM   Specimen: Urine, Catheterized  Result Value Ref Range Status   Specimen Description URINE, CATHETERIZED  Final   Special Requests   Final    NONE Performed at [facility], [place] [address], [place], [place] [address]    Culture 20,000 COLONIES/mL ENTEROCOCCUS FAECALIS (A)  Final   Report Status 03/01/2022 FINAL  Final   Organism ID, Bacteria ENTEROCOCCUS FAECALIS (A)  Final      Susceptibility   Enterococcus faecalis - MIC*    AMPICILLIN <=2 SENSITIVE Sensitive     NITROFURANTOIN <=16 SENSITIVE Sensitive     VANCOMYCIN 1 SENSITIVE Sensitive     * 20,000 COLONIES/mL ENTEROCOCCUS FAECALIS     Radiology Studies: No results found.  Scheduled Meds:  apixaban   Does not apply Once   apixaban  10 mg Oral BID   Followed by   Derrill Memo ON 03/10/2022] apixaban  5 mg Oral BID   DULoxetine  60 mg Oral BID   folic acid  1 mg Oral Daily   gabapentin  900 mg Oral TID   insulin aspart  0-15 Units Subcutaneous TID WC   insulin aspart  0-5 Units Subcutaneous QHS   insulin aspart  8 Units Subcutaneous TID WC   insulin glargine-yfgn  20 Units Subcutaneous BID   nortriptyline  10 mg Oral QHS   pravastatin  40 mg Oral Daily   predniSONE  60 mg Oral Q breakfast   sodium chloride flush  3 mL Intravenous Q12H   sulfamethoxazole-trimethoprim  1 tablet Oral Daily   Continuous Infusions:  sodium chloride     vancomycin 1,000 mg (03/03/22 0824)     LOS: 5 days   Shelly Coss, MD [facility] P6/28/2023, 10:37 AM

## 2022-03-03 NOTE — TOC Transition Note (Signed)
Transition of Care (TOC) - CM/SW Discharge Note Donn Pierini RN, BSN Transitions of Care Unit 4E- RN Case Manager See Treatment Team for direct phone #    Patient Details  Name: James Hartman MRN: [id number] Date of Birth: 1976-03-23  Transition of Care [facility]) CM/SW Contact:  Darrold Span, RN Phone Number: 03/03/2022, 11:14 AM   Clinical Narrative:    D/C on hold for today 2/2 to elevated HR and WBC. HH orders have been placed for HHPT/OT.  CM spoke with pt at bedside for transition needs, discussed HH recommendations- pt agreeable. List provided for [facility] choice Per CMS guidelines from medicare.gov website with star ratings (copy placed in shadow chart) . Pt has selected [facility] as first choice. If unable to accept then pt voiced that he has no further preference and will defer to this writer to secure [facility] on his behalf.   Per pt he has all needed DME at home, no new DME needs at this time. Family to transport home.   Pt will be new to Eliquis- PharmD has spoken with pt, per benefits check copay - zero dollars. TOC pharmacy to fill start pack with 30 day free coupon and deliver to bedside prior to discharge.   Address, phone # and PCP all confirmed with pt in epic.   Call made to [facility]- for [facility] referral- referral has been accepted for  HHPT/OT- notified that d/c is on hold for today.    Final next level of care: Home w Home Health Services Barriers to Discharge: Continued Medical Work up   Patient Goals and CMS Choice Patient states their goals for this hospitalization and ongoing recovery are:: return home CMS Medicare.gov Compare Post Acute Care list provided to:: Patient Choice offered to / list presented to : Patient  Discharge Placement             Home w/ [facility]          Discharge Plan and Services   Discharge Planning Services: CM Consult Post Acute Care Choice: Home Health          DME Arranged: N/A DME Agency: NA       HH Arranged: PT,  OT HH Agency: [facility] Date HH Agency Contacted: 03/03/22 Time HH Agency Contacted: 1020 Representative spoke with at [facility] Agency: Morrie Sheldon  Social Determinants of Health (SDOH) Interventions     Readmission Risk Interventions    03/03/2022   11:14 AM  Readmission Risk Prevention Plan  Transportation Screening Complete  PCP or Specialist Appt within 3-5 Days Complete  HRI or Home Care Consult Complete  Social Work Consult for Recovery Care Planning/Counseling Complete  Palliative Care Screening Not Applicable  Medication Review Oceanographer) Complete

## 2022-03-03 NOTE — Progress Notes (Signed)
ANTICOAGULATION CONSULT NOTE  Pharmacy Consult for Heparin Indication: DVT  No Known Allergies  Patient Measurements: Height: 5\' 8"  (172.7 cm) Weight: 135.3 kg (298 lb 4.5 oz) IBW/kg (Calculated) : 68.4  Heparin Dosing Weight: 97 kg  Vital Signs: Temp: 99.1 F (37.3 C) (06/28 0312) Temp Source: Oral (06/28 0312) BP: 121/71 (06/28 0312) Pulse Rate: 108 (06/28 0312)  Labs: Recent Labs    03/01/22 0224 03/01/22 1043 03/01/22 2320 03/02/22 0130 03/03/22 0104  HGB 9.2*  --   --  8.9* 9.0*  HCT 29.9*  --   --  27.8* 28.3*  PLT 134*  --   --  130* 170  HEPARINUNFRC 0.28*   < > 0.31 0.34 0.33  CREATININE 1.20  --   --  1.11 1.11   < > = values in this interval not displayed.     Estimated Creatinine Clearance: 109.6 mL/min (by C-G formula based on SCr of 1.11 mg/dL).   Assessment: 46 yo male who is s/p LLE thrombectomy and fasciotomies. Patient on any oral anticoagulants. Pharmacy consulted to manage heparin.   Heparin level came back therapeutic at 0.33, on 2000 units/hr. Hgb 9, plt 170. No s/sx of bleeding or infusion issues.   Goal of Therapy:  Heparin level 0.3-0.7 units/ml Monitor platelets by anticoagulation protocol: Yes   Plan:  Continue heparin infusion at 2000 units/hr Check heparin level daily while on heparin Continue to monitor H&H and platelets  52, PharmD, BCCCP Clinical Pharmacist  Phone: [phone] 03/03/2022 7:40 AM  Please check AMION for all [facility] Pharmacy phone numbers After 10:00 PM, call Main Pharmacy [phone]

## 2022-03-03 NOTE — Progress Notes (Signed)
Physical Therapy Treatment Patient Details Name: James Hartman MRN: [id number] DOB: 1976/08/23 Today's Date: 03/03/2022   History of Present Illness The pt is a 46 yo male presenting 6/23 with bilateral leg and foot pain and numbness x3 days. Pt found to have decreased pulses with L foot being cooler than R. S/p L iliac thrombectomy with compartment fasciotomies x4 on 6/23. Hospitalization complicated by RLE DVT and septic shock. PMH includes: HTN, DM II, and morbid obesity.    PT Comments    The pt is highly motivated to continue with PT sessions even after mobility session earlier this morning. He continues to need up to [facility] and heavy use of momentum and BUE support to power up to standing from recliner due to poor functional strength and power in BLE. The pt was able to complete 2 short bouts of walking (18 ft + 8 ft) with use of RW, minG, and chair follow as pt fatigues quickly requiring seated rest. The pt's HR increased to 148bpm with first bout of walking, recovered to 111bpm after 5 min seated rest, and then increased to 150bpm with second bout of walking before pt asked to sit.  The pt was educated on importance of checking HR with activity to allow for adequate rest and reports understanding. Pt very appreciative, feels he is doing better, but will continue to benefit from skilled PT to address deficits as listed above both acutely and after d/c home.      Recommendations for follow up therapy are one component of a multi-disciplinary discharge planning process, led by the attending physician.  Recommendations may be updated based on patient status, additional functional criteria and insurance authorization.  Follow Up Recommendations  Home health PT     Assistance Recommended at Discharge Frequent or constant Supervision/Assistance  Patient can return home with the following A lot of help with walking and/or transfers;A lot of help with bathing/dressing/bathroom;Help with stairs or  ramp for entrance;Assist for transportation;Assistance with cooking/housework   Equipment Recommendations  None recommended by PT;Other (comment) (patient has necessary equipment)    Recommendations for Other Services       Precautions / Restrictions Precautions Precautions: Fall Precaution Comments: watch HR Restrictions Weight Bearing Restrictions: No     Mobility  Bed Mobility               General bed mobility comments: pt OOB in recliner at start and end of session    Transfers Overall transfer level: Needs assistance Equipment used: Rolling walker (2 wheels) Transfers: Sit to/from Stand Sit to Stand: Mod assist, From elevated surface           General transfer comment: modA with cues to use momentum. pt with poor ability to power up through BLE. assist to steady in standing. completed x4 in session from recliner    Ambulation/Gait Ambulation/Gait assistance: Min assist Gait Distance (Feet): 18 Feet (+ 8 ft) Assistive device: Rolling walker (2 wheels) Gait Pattern/deviations: Step-to pattern, Decreased stride length, Decreased weight shift to left, Knees buckling, Decreased step length - left, Shuffle, Trunk flexed Gait velocity: decr Gait velocity interpretation: <1.31 ft/sec, indicative of household ambulator   General Gait Details: pt with slowed steps and progressively flexed trunk. cues to slow breathing and improve posture. HR to max 148 with first bout and max 150bpm with second shorter bout    Balance Overall balance assessment: Needs assistance Sitting-balance support: Feet supported Sitting balance-Leahy Scale: Good     Standing balance support: Bilateral upper extremity  supported, Reliant on assistive device for balance, During functional activity Standing balance-Leahy Scale: Fair Standing balance comment: heavy reliance on BUE support on RW during dynamic standing                            Cognition Arousal/Alertness:  Awake/alert Behavior During Therapy: WFL for tasks assessed/performed Overall Cognitive Status: Within Functional Limits for tasks assessed                                 General Comments: pt alert and motivated, educated at length on importance of monitoring HR when pushign exercise at home        Exercises      General Comments General comments (skin integrity, edema, etc.): SpO2 to low of 89% on RA with exertion but improved to 93 quickly. HR to max 140bpm with static standing for cleaning, then max 148bpm with initial bout of walking and max 150bpm with second bout of walkin      Pertinent Vitals/Pain Pain Assessment Pain Assessment: Faces Faces Pain Scale: Hurts little more Pain Location: L LE Pain Descriptors / Indicators: Discomfort, Grimacing, Sore Pain Intervention(s): Monitored during session, Repositioned, Limited activity within patient's tolerance     PT Goals (current goals can now be found in the care plan section) Acute Rehab PT Goals Patient Stated Goal: to get better PT Goal Formulation: With patient Time For Goal Achievement: 03/09/22 Potential to Achieve Goals: Good Progress towards PT goals: Progressing toward goals    Frequency    Min 3X/week      PT Plan Current plan remains appropriate       AM-PAC PT "6 Clicks" Mobility   Outcome Measure  Help needed turning from your back to your side while in a flat bed without using bedrails?: A Little Help needed moving from lying on your back to sitting on the side of a flat bed without using bedrails?: A Little Help needed moving to and from a bed to a chair (including a wheelchair)?: A Lot Help needed standing up from a chair using your arms (e.g., wheelchair or bedside chair)?: A Lot Help needed to walk in hospital room?: Total Help needed climbing 3-5 steps with a railing? : Total (pt unable to complete 20 ft) 6 Click Score: 12    End of Session Equipment Utilized During  Treatment: Gait belt Activity Tolerance: Patient tolerated treatment well Patient left: in chair;with call bell/phone within reach Nurse Communication: Mobility status (HR elevation) PT Visit Diagnosis: Unsteadiness on feet (R26.81);Other abnormalities of gait and mobility (R26.89);Difficulty in walking, not elsewhere classified (R26.2);Pain;Muscle weakness (generalized) (M62.81) Pain - Right/Left: Left Pain - part of body: Leg     Time: 7116-5790 PT Time Calculation (min) (ACUTE ONLY): 26 min  Charges:  $Gait Training: 8-22 mins $Therapeutic Exercise: 8-22 mins                     Vickki Muff, PT, DPT   Acute Rehabilitation Department   Ronnie Derby 03/03/2022, 12:29 PM

## 2022-03-03 NOTE — Plan of Care (Signed)
  Problem: Education: Goal: Ability to describe self-care measures that may prevent or decrease complications (Diabetes Survival Skills Education) will improve Outcome: Progressing Goal: Individualized Educational Video(s) Outcome: Progressing   Problem: Coping: Goal: Ability to adjust to condition or change in health will improve Outcome: Progressing   Problem: Fluid Volume: Goal: Ability to maintain a balanced intake and output will improve Outcome: Progressing   Problem: Health Behavior/Discharge Planning: Goal: Ability to identify and utilize available resources and services will improve Outcome: Progressing Goal: Ability to manage health-related needs will improve Outcome: Progressing   Problem: Metabolic: Goal: Ability to maintain appropriate glucose levels will improve Outcome: Progressing   Problem: Nutritional: Goal: Maintenance of adequate nutrition will improve Outcome: Progressing Goal: Progress toward achieving an optimal weight will improve Outcome: Progressing   Problem: Skin Integrity: Goal: Risk for impaired skin integrity will decrease Outcome: Progressing   Problem: Tissue Perfusion: Goal: Adequacy of tissue perfusion will improve Outcome: Progressing   Problem: Education: Goal: Knowledge of General Education information will improve Description: Including pain rating scale, medication(s)/side effects and non-pharmacologic comfort measures Outcome: Progressing   Problem: Health Behavior/Discharge Planning: Goal: Ability to manage health-related needs will improve Outcome: Progressing   Problem: Clinical Measurements: Goal: Ability to maintain clinical measurements within normal limits will improve Outcome: Progressing Goal: Will remain free from infection Outcome: Progressing Goal: Diagnostic test results will improve Outcome: Progressing Goal: Respiratory complications will improve Outcome: Progressing Goal: Cardiovascular complication will  be avoided Outcome: Progressing   Problem: Activity: Goal: Risk for activity intolerance will decrease Outcome: Progressing   Problem: Nutrition: Goal: Adequate nutrition will be maintained Outcome: Progressing   Problem: Coping: Goal: Level of anxiety will decrease Outcome: Progressing   Problem: Elimination: Goal: Will not experience complications related to bowel motility Outcome: Progressing Goal: Will not experience complications related to urinary retention Outcome: Progressing   Problem: Pain Managment: Goal: General experience of comfort will improve Outcome: Progressing   Problem: Safety: Goal: Ability to remain free from injury will improve Outcome: Progressing   Problem: Skin Integrity: Goal: Risk for impaired skin integrity will decrease Outcome: Progressing   Problem: Education: Goal: Understanding of CV disease, CV risk reduction, and recovery process will improve Outcome: Progressing Goal: Individualized Educational Video(s) Outcome: Progressing   Problem: Activity: Goal: Ability to return to baseline activity level will improve Outcome: Progressing   Problem: Cardiovascular: Goal: Ability to achieve and maintain adequate cardiovascular perfusion will improve Outcome: Progressing Goal: Vascular access site(s) Level 0-1 will be maintained Outcome: Progressing   Problem: Health Behavior/Discharge Planning: Goal: Ability to safely manage health-related needs after discharge will improve Outcome: Progressing   Problem: Fluid Volume: Goal: Hemodynamic stability will improve Outcome: Progressing   Problem: Clinical Measurements: Goal: Diagnostic test results will improve Outcome: Progressing Goal: Signs and symptoms of infection will decrease Outcome: Progressing   Problem: Respiratory: Goal: Ability to maintain adequate ventilation will improve Outcome: Progressing   

## 2022-03-03 NOTE — Inpatient Diabetes Management (Addendum)
Inpatient Diabetes Program Recommendations  AACE/ADA: New Consensus Statement on Inpatient Glycemic Control (2015)  Target Ranges:  Prepandial:   less than 140 mg/dL      Peak postprandial:   less than 180 mg/dL (1-2 hours)      Critically ill patients:  140 - 180 mg/dL   Lab Results  Component Value Date   GLUCAP 257 (H) 03/03/2022   HGBA1C 10.4 (H) 02/26/2022    Review of Glycemic Control  Latest Reference Range & Units 03/02/22 11:58 03/02/22 16:41 03/02/22 21:14 03/03/22 06:20 03/03/22 12:29  Glucose-Capillary 70 - 99 mg/dL 829 (H) 562 (H) 130 (H) 128 (H) 257 (H)  (H): Data is abnormally high Diabetes history: DM 2 Outpatient Diabetes medications: Glipizide 5 mg BID, Metformin 1000 mg BID Current orders for Inpatient glycemic control:  Novolog 0-15 units TID & HS, Semglee 20 units BID, Novolog 8 units TID  Prednisone 60 mg QD Inpatient Diabetes Program Recommendations:    Call received from MD regarding recs. For home insulin.  At this point, recommend Semglee 40 units daily and Novolog 8 units tid with meals (hold if patient eats less than 50% or NPO).  Discussed with patient.  Assisted him with application of freestyle CGM on Left arm that was given to him by DM coordinator a few days ago.  Reviewed use of CGM including how to scan, changing Sensor, Vitamin C warning, arrows with glucose readings, and Freestyle app.    Per RN, patient will not discharge today.  Will need to continue checking CBG's as ordered.  At discharge please order:   1) Basaglar insulin pen [phone]) 2) Novolog insulin pen [phone]) 3) Freestyle libre CGM (#[id number])    Thanks,  Beryl Meager, RN, BC-ADM Inpatient Diabetes Coordinator Pager [phone]  (8a-5p)

## 2022-03-03 NOTE — Plan of Care (Signed)
  Problem: Education: Goal: Ability to describe self-care measures that may prevent or decrease complications (Diabetes Survival Skills Education) will improve Outcome: Progressing Goal: Individualized Educational Video(s) Outcome: Progressing   Problem: Coping: Goal: Ability to adjust to condition or change in health will improve Outcome: Progressing   Problem: Fluid Volume: Goal: Ability to maintain a balanced intake and output will improve Outcome: Progressing   Problem: Health Behavior/Discharge Planning: Goal: Ability to identify and utilize available resources and services will improve Outcome: Progressing Goal: Ability to manage health-related needs will improve Outcome: Progressing   Problem: Metabolic: Goal: Ability to maintain appropriate glucose levels will improve Outcome: Progressing   Problem: Nutritional: Goal: Maintenance of adequate nutrition will improve Outcome: Progressing Goal: Progress toward achieving an optimal weight will improve Outcome: Progressing   Problem: Skin Integrity: Goal: Risk for impaired skin integrity will decrease Outcome: Progressing   Problem: Tissue Perfusion: Goal: Adequacy of tissue perfusion will improve Outcome: Progressing   Problem: Education: Goal: Knowledge of General Education information will improve Description: Including pain rating scale, medication(s)/side effects and non-pharmacologic comfort measures Outcome: Progressing   Problem: Health Behavior/Discharge Planning: Goal: Ability to manage health-related needs will improve Outcome: Progressing   Problem: Clinical Measurements: Goal: Ability to maintain clinical measurements within normal limits will improve Outcome: Progressing Goal: Will remain free from infection Outcome: Progressing Goal: Diagnostic test results will improve Outcome: Progressing Goal: Respiratory complications will improve Outcome: Progressing Goal: Cardiovascular complication will  be avoided Outcome: Progressing   Problem: Activity: Goal: Risk for activity intolerance will decrease Outcome: Progressing   Problem: Nutrition: Goal: Adequate nutrition will be maintained Outcome: Progressing   Problem: Coping: Goal: Level of anxiety will decrease Outcome: Progressing   Problem: Elimination: Goal: Will not experience complications related to bowel motility Outcome: Progressing Goal: Will not experience complications related to urinary retention Outcome: Progressing   Problem: Pain Managment: Goal: General experience of comfort will improve Outcome: Progressing   Problem: Safety: Goal: Ability to remain free from injury will improve Outcome: Progressing   Problem: Skin Integrity: Goal: Risk for impaired skin integrity will decrease Outcome: Progressing   Problem: Education: Goal: Understanding of CV disease, CV risk reduction, and recovery process will improve Outcome: Progressing Goal: Individualized Educational Video(s) Outcome: Progressing   Problem: Activity: Goal: Ability to return to baseline activity level will improve Outcome: Progressing   Problem: Cardiovascular: Goal: Ability to achieve and maintain adequate cardiovascular perfusion will improve Outcome: Progressing Goal: Vascular access site(s) Level 0-1 will be maintained Outcome: Progressing   Problem: Health Behavior/Discharge Planning: Goal: Ability to safely manage health-related needs after discharge will improve Outcome: Progressing   Problem: Fluid Volume: Goal: Hemodynamic stability will improve Outcome: Progressing   Problem: Clinical Measurements: Goal: Diagnostic test results will improve Outcome: Progressing Goal: Signs and symptoms of infection will decrease Outcome: Progressing   Problem: Respiratory: Goal: Ability to maintain adequate ventilation will improve Outcome: Progressing

## 2022-03-03 NOTE — Progress Notes (Addendum)
  Progress Note    03/03/2022 7:38 AM 5 Days Post-Op  Subjective:  Patient says his pain is improving. He is happy that he is able to slowly walk around the room.    Vitals:   03/03/22 0056 03/03/22 0312  BP:  121/71  Pulse: (!) 105 (!) 108  Resp: 19 20  Temp:  99.1 F (37.3 C)  SpO2: 96% 92%    Physical Exam: Cardiac:  regular Lungs:  nonlabored Incisions:  L groin incision c/d/I without hematoma. L lower extremity incisions c/d/I with staples. Medial incision covered with gauze dressing due to some drainage. Extremities:  L foot warm, brisk peroneal and AT   CBC    Component Value Date/Time   WBC 15.4 (H) 03/03/2022 0104   RBC 3.05 (L) 03/03/2022 0104   HGB 9.0 (L) 03/03/2022 0104   HCT 28.3 (L) 03/03/2022 0104   PLT 170 03/03/2022 0104   MCV 92.8 03/03/2022 0104   MCH 29.5 03/03/2022 0104   MCHC 31.8 03/03/2022 0104   RDW 13.8 03/03/2022 0104   LYMPHSABS 0.9 02/26/2022 1046   MONOABS 0.8 02/26/2022 1046   EOSABS 0.0 02/26/2022 1046   BASOSABS 0.0 02/26/2022 1046    BMET    Component Value Date/Time   NA 138 03/03/2022 0104   K 4.6 03/03/2022 0104   CL 103 03/03/2022 0104   CO2 22 03/03/2022 0104   GLUCOSE 148 (H) 03/03/2022 0104   BUN 22 (H) 03/03/2022 0104   CREATININE 1.11 03/03/2022 0104   CALCIUM 8.6 (L) 03/03/2022 0104   GFRNONAA >60 03/03/2022 0104    INR    Component Value Date/Time   INR 1.2 02/27/2022 0944     Intake/Output Summary (Last 24 hours) at 03/03/2022 0738 Last data filed at 03/03/2022 0600 Gross per 24 hour  Intake 478.06 ml  Output 2300 ml  Net -1821.94 ml     Assessment/Plan:  46 y.o. male is 5 days post op, s/p: left iliac thrombectomy plus left lower extremity thrombectomy and left lower extremity fasciotomies.  -L groin incision c/d/I -L lower leg incisions c/d/l with staples. Medial incision with some drainage, gauze dressing in place. -Calf is soft. -Brisk L peroneal signal -Ok to discharge from vascular  standpoint, pending HHPT setup and affordability of DOAC -DVT prophylaxis:  heparin   Loel Dubonnet, PA-C [facility] [phone] 03/03/2022 7:38 AM   I have seen and evaluated the patient. I agree with the PA note as documented above.  Status post left iliac and left lower extremity thrombectomy with left leg fasciotomies on Friday for acute limb ischemia with embolic event.  Still has a triphasic peroneal signal at the ankle and now much more brisk AT signal.  Foot is starting to feel better.  He did ambulate yesterday.  Calf is soft.  I think we can transition to DOAC pending affordability.  We will arrange follow-up with [place] in 2 to 3 weeks for incision checks and staple removal.  Left groin incision looks okay and I put a dry dressing there.  Cephus Shelling, MD [facility] [place] Office: [phone]

## 2022-03-03 NOTE — TOC Benefit Eligibility Note (Signed)
Patient Scientific laboratory technician completed.     The patient is currently admitted and upon discharge could be taking Eliquis 5 mg   The current 30 day co-pay is, $0.00.    The patient is currently admitted and upon discharge could be taking Xarelto 20 mg.   The current 30 day co-pay is, $0.00.     The patient is insured through [employer] & [employer] [place] [employer]       Roland Earl, CPhT Pharmacy Patient Advocate Specialist [facility] Patient Advocate Team Direct Number: [phone]  Fax: [phone]

## 2022-03-03 NOTE — Progress Notes (Signed)
Mobility Specialist: Progress Note   03/03/22 1123  Mobility  Activity Ambulated with assistance in hallway  Level of Assistance Moderate assist, patient does 50-74%  Assistive Device Front wheel walker  Distance Ambulated (ft) 26 ft (10'+16')  Activity Response Tolerated well  $Mobility charge 1 Mobility   Pre-Mobility: 112 HR, 92% SpO2 During Mobility: 149 HR, 94% SpO2 Post-Mobility: 116 HR, 154/80 (98) BP, 94% SpO2  Pt received sitting EOB and agreeable to mobility. C/o 4/10 LLE pain during session, otherwise asymptomatic. Stopped x1 for seated break secondary to fatigue and LLE pain. Pt set up at the sink per NT request, call bell and phone in reach.   [facility] Cambelle Suchecki Mobility Specialist Mobility Specialist [address]: [phone]

## 2022-03-04 DIAGNOSIS — I998 Other disorder of circulatory system: Secondary | ICD-10-CM | POA: Diagnosis not present

## 2022-03-04 LAB — BASIC METABOLIC PANEL
Anion gap: 9 (ref 5–15)
BUN: 19 mg/dL (ref 6–20)
CO2: 24 mmol/L (ref 22–32)
Calcium: 8.4 mg/dL — ABNORMAL LOW (ref 8.9–10.3)
Chloride: 103 mmol/L (ref 98–111)
Creatinine, Ser: 1.11 mg/dL (ref 0.61–1.24)
GFR, Estimated: >60 mL/min (ref >60)
Glucose, Bld: 144 mg/dL — ABNORMAL HIGH (ref 70–99)
Potassium: 4.2 mmol/L (ref 3.5–5.1)
Sodium: 136 mmol/L (ref 135–145)

## 2022-03-04 LAB — CULTURE, BLOOD (ROUTINE X 2)
Culture: NO GROWTH
Culture: NO GROWTH
Special Requests: ADEQUATE
Special Requests: ADEQUATE

## 2022-03-04 LAB — GLUCOSE, CAPILLARY
Glucose-Capillary: 119 mg/dL — ABNORMAL HIGH (ref 70–99)
Glucose-Capillary: 240 mg/dL — ABNORMAL HIGH (ref 70–99)
Glucose-Capillary: 341 mg/dL — ABNORMAL HIGH (ref 70–99)
Glucose-Capillary: 312 mg/dL — ABNORMAL HIGH (ref 70–99)

## 2022-03-04 LAB — CBC
HCT: 27.1 % — ABNORMAL LOW (ref 39.0–52.0)
Hemoglobin: 8.7 g/dL — ABNORMAL LOW (ref 13.0–17.0)
MCH: 30 pg (ref 26.0–34.0)
MCHC: 32.1 g/dL (ref 30.0–36.0)
MCV: 93.4 fL (ref 80.0–100.0)
Platelets: 175 10*3/uL (ref 150–400)
RBC: 2.9 MIL/uL — ABNORMAL LOW (ref 4.22–5.81)
RDW: 14 % (ref 11.5–15.5)
WBC: 15.1 10*3/uL — ABNORMAL HIGH (ref 4.0–10.5)
nRBC: 1 % — ABNORMAL HIGH (ref 0.0–0.2)

## 2022-03-04 LAB — LIPID PANEL
Cholesterol: 162 mg/dL (ref 0–200)
HDL: 55 mg/dL (ref >40)
LDL Cholesterol: 79 mg/dL (ref 0–99)
Total CHOL/HDL Ratio: 2.9 RATIO
Triglycerides: 140 mg/dL (ref <150)
VLDL: 28 mg/dL (ref 0–40)

## 2022-03-04 MED ORDER — FUROSEMIDE 10 MG/ML IJ SOLN
60.0000 mg | Freq: Two times a day (BID) | INTRAMUSCULAR | Status: DC
  Administered 2022-03-04 – 2022-03-06 (×6): 60 mg via INTRAVENOUS
  Filled 2022-03-04 (×6): qty 6

## 2022-03-04 MED ORDER — ATORVASTATIN CALCIUM 80 MG PO TABS
80.0000 mg | ORAL_TABLET | Freq: Every day | ORAL | Status: DC
  Administered 2022-03-05 – 2022-03-08 (×4): 80 mg via ORAL
  Filled 2022-03-04 (×4): qty 1

## 2022-03-04 NOTE — Progress Notes (Signed)
PROGRESS NOTE  James Hartman  FXT:[id number] DOB: 09/11/1975 DOA: 02/26/2022 PCP: [facility]   Brief Narrative: Patient is a 45 year old male with history of hypertension, diabetes type 2, morbid obesity, neurosarcoidosis versus granulomatous lymphadenitis who initially presented at [facility] with complaint of bilateral lower extremity pain, swelling.  He was found to have left leg arterial clot with concern for limb ischemia and was transferred to [facility].  Status post thrombectomy/fasciotomy of left leg.  He was on heparin drip.  Hospital course remarkable for postoperative hypotension, fever, had to be transferred to ICU on pressors.  Also started on pressors, antibiotics.  Currently hemodynamically stable but has persistent sinus tachycardia.  Found to have severe volume overload, started on IV Lasix.  Plan for discharge to home after improvement in the volume status.  Assessment & Plan:  Principal Problem:   Acute lower limb ischemia with multifocal high grade stenosis or occlusion indicative of showering emboli  Active Problems:   Septic shock (HCC)   AKI (acute kidney injury) (HCC)   Acute pulmonary embolism and right LE DVT    Leukocytosis   Type 2 diabetes mellitus (HCC)   Neurosarcoidosis   Essential hypertension   Moderate persistent asthma   Normocytic anemia   Mixed hyperlipidemia   Obstructive sleep apnea   Granulomatous lymphadenitis   DVT (deep venous thrombosis) (HCC)  Acute lower extremity ischemia with multifocal high-grade stenosis/occlusion indicative of scarring emboli on the left: Vascular surgery was consulted.  Status post thrombectomy, fasciotomy.  Vascular surgery cleared for discharge.  He was on heparin drip, being transitioned to Eliquis.  He will follow-up with vascular surgery in 2 to 3 weeks for suture removal/follow-up  Volume overload: Patient got tons of fluid when he was in ICU.  Elevated BNP.  Looks grossly volume  overloaded with bilateral lower extremity edema, upper extremity edema.  Continue Lasix 60 mg IV twice a day.  Monitor BMP  AKI: Creatinine peaked at 2.8 likely from ATN/hypotension.  Currently stable.  Septic shock/UTI: Required pressors while in ICU.  Currently off pressors, blood pressure stable.  Afebrile.  Still has leukocytosis.  Urine culture showed Enterococcus, treated with vancomycin.  Acute PE/right DVT: Incidental finding on CT angio, associated with right lower extremity DVT.  On heparin drip, now on Eliquis  Diabetes type 2/hyperglycemia: Hemoglobin A1c of 10.4 diarrhea coronary was following.  Takes glipizide, metformin at home.  Currently on insulin and sliding scale.  He needs insulin on discharge.  Planning to discharge with Basaglar 20 units BID, and Novolog 10 units TID.  Neurosarcoidosis: Follows at [facility].  On prednisone 60 mg daily.  On outpatient PCP prophylaxis with Bactrim, follows with Dr. Algis Liming  Hypertension: Hospital course remarkable for hypotension.  On Diovan and hydrochlorothiazide at home, currently on hold  Sinus tachycardia: Unclear etiology,likely due to volume overload.  TSH normal.  Recent echo showed EF of more than 75%, grade 1 diastolic dysfunction.  We will continue to monitor on telemetry.  Added low dose metoprolol  Moderate persistent asthma: Continue bronchodilators.  Currently on room air  Normocytic anemia: Currently hemoglobin stable  Hyperlipidemia: On statin  History of OSA: On CPAP at night  Morbid obesity: BMI 45.3          DVT prophylaxis: apixaban (ELIQUIS) tablet 10 mg  apixaban (ELIQUIS) tablet 5 mg     Code Status: Full Code  Family Communication: None at the bedside  Patient status: Inpatient  Patient is from :  Home  Anticipated discharge to: Home  Estimated DC date: Tomorrow if remain in the volume status   Consultants: Vascular surgery  Procedures: As above  Antimicrobials:  Anti-infectives (From  admission, onward)    Start     Dose/Rate Route Frequency Ordered Stop   03/01/22 0800  ceFEPIme (MAXIPIME) 2 g in sodium chloride 0.9 % 100 mL IVPB  Status:  Discontinued        2 g 200 mL/hr over 30 Minutes Intravenous Every 8 hours 03/01/22 0734 03/02/22 1135   03/01/22 0800  vancomycin (VANCOCIN) IVPB 1000 mg/200 mL premix        1,000 mg 200 mL/hr over 60 Minutes Intravenous Every 12 hours 03/01/22 0740 03/03/22 2106   02/28/22 1515  vancomycin (VANCOREADY) IVPB 1250 mg/250 mL  Status:  Discontinued        1,250 mg 166.7 mL/hr over 90 Minutes Intravenous Every 24 hours 02/28/22 1421 03/01/22 0740   02/28/22 1400  vancomycin (VANCOCIN) IVPB 1000 mg/200 mL premix  Status:  Discontinued        1,000 mg 200 mL/hr over 60 Minutes Intravenous Every 24 hours 02/27/22 1245 02/28/22 0809   02/28/22 1000  sulfamethoxazole-trimethoprim (BACTRIM DS) 800-160 MG per tablet 1 tablet        1 tablet Oral Daily 02/28/22 0819     02/27/22 2200  vancomycin (VANCOCIN) IVPB 1000 mg/200 mL premix  Status:  Discontinued        1,000 mg 200 mL/hr over 60 Minutes Intravenous Every 24 hours 02/27/22 1003 02/27/22 1245   02/27/22 1330  vancomycin (VANCOREADY) IVPB 2000 mg/400 mL        2,000 mg 200 mL/hr over 120 Minutes Intravenous  Once 02/27/22 1245 02/27/22 1458   02/27/22 1100  vancomycin (VANCOREADY) IVPB 2000 mg/400 mL  Status:  Discontinued        2,000 mg 200 mL/hr over 120 Minutes Intravenous  Once 02/27/22 1000 02/27/22 1245   02/27/22 1030  ceFEPIme (MAXIPIME) 2 g in sodium chloride 0.9 % 100 mL IVPB  Status:  Discontinued        2 g 200 mL/hr over 30 Minutes Intravenous Every 12 hours 02/27/22 0958 03/01/22 0734   02/27/22 1015  ceFEPIme (MAXIPIME) 2 g in sodium chloride 0.9 % 100 mL IVPB  Status:  Discontinued        2 g 200 mL/hr over 30 Minutes Intravenous  Once 02/27/22 0925 02/27/22 0958   02/27/22 1015  metroNIDAZOLE (FLAGYL) IVPB 500 mg  Status:  Discontinued        500 mg 100 mL/hr  over 60 Minutes Intravenous Every 12 hours 02/27/22 0925 03/02/22 1135   02/27/22 1015  vancomycin (VANCOCIN) IVPB 1000 mg/200 mL premix  Status:  Discontinued        1,000 mg 200 mL/hr over 60 Minutes Intravenous  Once 02/27/22 0925 02/27/22 0956   02/26/22 1435  ceFAZolin (ANCEF) 2-4 GM/100ML-% IVPB       Note to Pharmacy: Cameron Sprang M: cabinet override      02/26/22 1435 02/27/22 0244   02/26/22 1433  ceFAZolin (ANCEF) 3-0.9 GM/100ML-% IVPB       Note to Pharmacy: Valda Lamb J: cabinet override      02/26/22 1433 02/27/22 0244       Subjective:  Patient seen and examined at the bedside this morning.  Hemodynamically stable.  Comfortable, lying on the chair.  Still in sinus tachycardia with heart rate ranging from 115-120.  Looks grossly volume  overloaded.  On room air.   Objective: Vitals:   03/04/22 0315 03/04/22 0742 03/04/22 0848 03/04/22 1108  BP: 132/73 124/80 129/78 (!) 118/54  Pulse: 100 92 (!) 107 96  Resp: 19 20 18 18   Temp: 99.1 F (37.3 C) 97.8 F (36.6 C)  98.1 F (36.7 C)  TempSrc: Oral Oral  Oral  SpO2: 97% 94% 97% 92%  Weight:      Height:        Intake/Output Summary (Last 24 hours) at 03/04/2022 1144 Last data filed at 03/04/2022 0800 Gross per 24 hour  Intake 600 ml  Output 3100 ml  Net -2500 ml   Filed Weights   02/26/22 1012 02/26/22 2019 03/03/22 0312  Weight: 131.5 kg 131 kg 135.3 kg    Examination:  General exam: Overall comfortable, not in distress, morbidly obese HEENT: PERRL Respiratory system:  no wheezes or crackles  Cardiovascular system: Sinus tachycardia  gastrointestinal system: Abdomen is nondistended, soft and nontender. Central nervous system: Alert and oriented Extremities: Bilateral lower extremity pitting edema, no clubbing ,no cyanosis, staples on the left lower extremity Skin: No rashes, no ulcers,no icterus     Data Reviewed: I have personally reviewed following labs and imaging studies  CBC: Recent Labs   Lab 02/26/22 1046 02/26/22 1625 02/28/22 0011 03/01/22 0224 03/02/22 0130 03/03/22 0104 03/04/22 0232  WBC 16.2*   < > 14.2* 13.9* 13.9* 15.4* 15.1*  NEUTROABS 14.3*  --   --   --   --   --   --   HGB 13.7   < > 10.2* 9.2* 8.9* 9.0* 8.7*  HCT 42.3   < > 32.7* 29.9* 27.8* 28.3* 27.1*  MCV 90.4   < > 94.8 94.3 93.3 92.8 93.4  PLT 214   < > 148* 134* 130* 170 175   < > = values in this interval not displayed.   Basic Metabolic Panel: Recent Labs  Lab 02/28/22 0011 03/01/22 0224 03/02/22 0130 03/03/22 0104 03/04/22 0232  NA 135 132* 134* 138 136  K 4.4 4.9 4.5 4.6 4.2  CL 102 101 102 103 103  CO2 20* 24 23 22 24   GLUCOSE 164* 265* 205* 148* 144*  BUN 31* 16 20 22* 19  CREATININE 2.32* 1.20 1.11 1.11 1.11  CALCIUM 7.7* 8.1* 8.2* 8.6* 8.4*  MG 2.0  --  2.3 2.4  --   PHOS 6.3*  --   --   --   --      Recent Results (from the past 240 hour(s))  Culture, blood (x 2)     Status: None   Collection Time: 02/27/22  9:39 AM   Specimen: BLOOD  Result Value Ref Range Status   Specimen Description BLOOD RIGHT ANTECUBITAL  Final   Special Requests   Final    BOTTLES DRAWN AEROBIC AND ANAEROBIC Blood Culture adequate volume   Culture   Final    NO GROWTH 5 DAYS Performed at [facility], [address] [place], [place] [address]    Report Status 03/04/2022 FINAL  Final  Culture, blood (x 2)     Status: None   Collection Time: 02/27/22  9:53 AM   Specimen: BLOOD  Result Value Ref Range Status   Specimen Description BLOOD RIGHT ANTECUBITAL  Final   Special Requests   Final    BOTTLES DRAWN AEROBIC AND ANAEROBIC Blood Culture adequate volume   Culture   Final    NO GROWTH 5 DAYS Performed at  [facility], [address], [place], [place] [address]    Report Status 03/04/2022 FINAL  Final  MRSA Next Gen by PCR, Nasal     Status: None   Collection Time: 02/27/22 12:04 PM   Specimen: Nasal Mucosa; Nasal Swab  Result Value Ref Range Status   MRSA by PCR Next Gen  NOT DETECTED NOT DETECTED Final    Comment: (NOTE) The GeneXpert MRSA Assay (FDA approved for NASAL specimens only), is one component of a comprehensive MRSA colonization surveillance program. It is not intended to diagnose MRSA infection nor to guide or monitor treatment for MRSA infections. Test performance is not FDA approved in patients less than 88 years old. Performed at [facility], [address], [place], [place] [address]   Urine Culture     Status: Abnormal   Collection Time: 02/27/22 12:45 PM   Specimen: Urine, Catheterized  Result Value Ref Range Status   Specimen Description URINE, CATHETERIZED  Final   Special Requests   Final    NONE Performed at [facility], [address] [place], [place] [address]    Culture 20,000 COLONIES/mL ENTEROCOCCUS FAECALIS (A)  Final   Report Status 03/01/2022 FINAL  Final   Organism ID, Bacteria ENTEROCOCCUS FAECALIS (A)  Final      Susceptibility   Enterococcus faecalis - MIC*    AMPICILLIN <=2 SENSITIVE Sensitive     NITROFURANTOIN <=16 SENSITIVE Sensitive     VANCOMYCIN 1 SENSITIVE Sensitive     * 20,000 COLONIES/mL ENTEROCOCCUS FAECALIS     Radiology Studies: DG CHEST PORT 1 VIEW  Result Date: 03/03/2022 CLINICAL DATA:  Tachypnea EXAM: PORTABLE CHEST 1 VIEW COMPARISON:  02/27/2022 FINDINGS: The heart size and mediastinal contours are stable. No focal airspace consolidation, pleural effusion, or pneumothorax. The visualized skeletal structures are unremarkable. IMPRESSION: No active disease. Electronically Signed   By: Duanne Guess D.O.   On: 03/03/2022 13:10    Scheduled Meds:  apixaban  10 mg Oral BID   Followed by   Melene Muller ON 03/10/2022] apixaban  5 mg Oral BID   [START ON 03/05/2022] atorvastatin  80 mg Oral Daily   DULoxetine  60 mg Oral BID   folic acid  1 mg Oral Daily   furosemide  60 mg Intravenous Q12H   gabapentin  900 mg Oral TID   insulin aspart  0-15 Units Subcutaneous TID WC   insulin aspart   0-5 Units Subcutaneous QHS   insulin aspart  8 Units Subcutaneous TID WC   insulin glargine-yfgn  20 Units Subcutaneous BID   metoprolol tartrate  25 mg Oral BID   nortriptyline  10 mg Oral QHS   predniSONE  60 mg Oral Q breakfast   sodium chloride flush  3 mL Intravenous Q12H   sulfamethoxazole-trimethoprim  1 tablet Oral Daily   Continuous Infusions:  sodium chloride       LOS: 6 days   Burnadette Pop, MD [facility] P6/29/2023, 11:44 AM

## 2022-03-04 NOTE — Progress Notes (Addendum)
Heart Failure Navigator Progress Note  Assessed for Heart & Vascular TOC clinic readiness.  Patient does not meet criteria due to not acute heart failure., See physician at [facility] for .sarcoidosis.    Rhae Hammock, BSN, Scientist, clinical (histocompatibility and immunogenetics) Only

## 2022-03-04 NOTE — Progress Notes (Signed)
Physical Therapy Treatment Patient Details Name: James Hartman MRN: [id number] DOB: [birth year] Today's Date: 03/04/2022   History of Present Illness The pt is a 46 yo male presenting 6/23 with bilateral leg and foot pain and numbness x3 days. Pt found to have decreased pulses with L foot being cooler than R. S/p L iliac thrombectomy with compartment fasciotomies x4 on 6/23. Hospitalization complicated by RLE DVT and septic shock. PMH includes: HTN, DM II, and morbid obesity.    PT Comments    Pt continues to be motivated and with continued progress towards goals. Pt requiring up to min a and heavy use of rocking/momentum to come to standing with RW. Pt needing min guard for ambulation with RW for x2 bouts of increased distance this session, however pt continues to fatigue quickly needing seated recovery break. Pt HR continues to increase throughout activity up to 145 bpm during first bout of ambulation, recovering to 102 with 5 min seated rest and up to 149 bpm during second bout of ambulation. Pt educated re; energy conservation and increasing activity tolerance slowly, with pt verbalizing understanding. Pt continues to benefit from skilled PT services to progress toward functional mobility goals.    Recommendations for follow up therapy are one component of a multi-disciplinary discharge planning process, led by the attending physician.  Recommendations may be updated based on patient status, additional functional criteria and insurance authorization.  Follow Up Recommendations  Home health PT     Assistance Recommended at Discharge Frequent or constant Supervision/Assistance  Patient can return home with the following A lot of help with walking and/or transfers;A lot of help with bathing/dressing/bathroom;Help with stairs or ramp for entrance;Assist for transportation;Assistance with cooking/housework   Equipment Recommendations  None recommended by PT;Other (comment) (patient has  necessary equipment)    Recommendations for Other Services       Precautions / Restrictions Precautions Precautions: Fall Precaution Comments: watch HR Restrictions Weight Bearing Restrictions: No     Mobility  Bed Mobility               General bed mobility comments: seated EOB on arrival    Transfers Overall transfer level: Needs assistance Equipment used: Rolling walker (2 wheels) Transfers: Sit to/from Stand Sit to Stand: From elevated surface, Min assist           General transfer comment: min a from elevated EOB, as pt stating chairs and bed elevated at home, assist to steady in standing as pt with slight posterior lean on each rise. completed x2 in session from EOB    Ambulation/Gait Ambulation/Gait assistance: Min assist Gait Distance (Feet): 36 Feet (+ 24) Assistive device: Rolling walker (2 wheels) Gait Pattern/deviations: Step-to pattern, Decreased stride length, Decreased weight shift to left, Knees buckling, Decreased step length - left, Shuffle, Trunk flexed Gait velocity: decr     General Gait Details: pt with slowed steps  cues to slow breathing and improve posture. HR to max 145 with first bout and max 148bpm with second shorter bout   Stairs             Wheelchair Mobility    Modified Rankin (Stroke Patients Only)       Balance Overall balance assessment: Needs assistance Sitting-balance support: Feet supported Sitting balance-Leahy Scale: Good     Standing balance support: Bilateral upper extremity supported, Reliant on assistive device for balance, During functional activity Standing balance-Leahy Scale: Fair Standing balance comment: heavy reliance on BUE support on RW during dynamic standing  Cognition Arousal/Alertness: Awake/alert Behavior During Therapy: WFL for tasks assessed/performed Overall Cognitive Status: Within Functional Limits for tasks assessed                                  General Comments: pt alert and motivated, reinforced education on importance of monitoring HR when exercising at home        Exercises General Exercises - Lower Extremity Ankle Circles/Pumps: AROM, Right, Left, 10 reps Long Arc Quad: AROM, Right, Left, 10 reps, Seated    General Comments General comments (skin integrity, edema, etc.): SpO2 to low of 89% on RA with exertion but improved to 95 quickly. HR max 145bpm with initial bout of walking and max 149bpm with second bout of walking      Pertinent Vitals/Pain Pain Assessment Pain Assessment: Faces Faces Pain Scale: Hurts a little bit Pain Location: L LE Pain Descriptors / Indicators: Discomfort, Grimacing, Sore Pain Intervention(s): Premedicated before session, Monitored during session    Home Living                          Prior Function            PT Goals (current goals can now be found in the care plan section) Acute Rehab PT Goals Patient Stated Goal: to get better PT Goal Formulation: With patient Time For Goal Achievement: 03/09/22    Frequency    Min 3X/week      PT Plan Current plan remains appropriate    Co-evaluation              AM-PAC PT "6 Clicks" Mobility   Outcome Measure  Help needed turning from your back to your side while in a flat bed without using bedrails?: A Little Help needed moving from lying on your back to sitting on the side of a flat bed without using bedrails?: A Little Help needed moving to and from a bed to a chair (including a wheelchair)?: A Lot Help needed standing up from a chair using your arms (e.g., wheelchair or bedside chair)?: A Lot Help needed to walk in hospital room?: A Lot Help needed climbing 3-5 steps with a railing? : Total 6 Click Score: 13    End of Session Equipment Utilized During Treatment: Gait belt Activity Tolerance: Patient tolerated treatment well Patient left: in chair;with call bell/phone within  reach Nurse Communication: Mobility status (HR elevation) PT Visit Diagnosis: Unsteadiness on feet (R26.81);Other abnormalities of gait and mobility (R26.89);Difficulty in walking, not elsewhere classified (R26.2);Pain;Muscle weakness (generalized) (M62.81) Pain - Right/Left: Left Pain - part of body: Leg     Time: 5956-3875 PT Time Calculation (min) (ACUTE ONLY): 23 min  Charges:  $Gait Training: 8-22 mins $Therapeutic Exercise: 8-22 mins                     James Hartman R. PTA Acute Rehabilitation Services Office: [phone]    Catalina Antigua 03/04/2022, 9:24 AM

## 2022-03-04 NOTE — Progress Notes (Signed)
Pt states he will self administer CPAP when he is ready for rest. 

## 2022-03-04 NOTE — Progress Notes (Signed)
OT Cancellation Note  Patient Details Name: Jowan Skillin MRN: [id number] DOB: [birth year]   Cancelled Treatment:    Reason Eval/Treat Not Completed:  (pt eating at this time and requested to wait)  Alphia Moh OTR/L  Acute Rehab Services  [phone] office number [phone] pager number  Alphia Moh 03/04/2022, 12:08 PM

## 2022-03-04 NOTE — Progress Notes (Signed)
[facility] [place]  Subjective  -states his left foot feels better.   Objective 129/78 (!) 107 97.8 F (36.6 C) (Oral) 18 97%  Intake/Output Summary (Last 24 hours) at 03/04/2022 0947 Last data filed at 03/04/2022 0600 Gross per 24 hour  Intake 360 ml  Output 3100 ml  Net -2740 ml    Left groin incision clean dry intact Left peroneal triphasic at the ankle Left AT brisk by Doppler Left foot warm Left leg fasciotomies closed with staples  Laboratory Lab Results: Recent Labs    03/03/22 0104 03/04/22 0232  WBC 15.4* 15.1*  HGB 9.0* 8.7*  HCT 28.3* 27.1*  PLT 170 175   BMET Recent Labs    03/03/22 0104 03/04/22 0232  NA 138 136  K 4.6 4.2  CL 103 103  CO2 22 24  GLUCOSE 148* 144*  BUN 22* 19  CREATININE 1.11 1.11  CALCIUM 8.6* 8.4*    COAG Lab Results  Component Value Date   INR 1.2 02/27/2022   INR 1.1 11/17/2021   No results found for: "PTT"  Assessment/Planning:  46 year old male underwent left iliac thrombectomy and left lower extremity thrombectomy on Friday with left leg fasciotomies.  Transitioned to DOAC yesterday and now on Eliquis.  Looks good from my standpoint.  Brisk Doppler signals at the ankle.  Fasciotomies closed with staples.  Follow-up arranged in 2 to 3 weeks for staple removal.  Cephus Shelling 03/04/2022 9:47 AM --

## 2022-03-04 NOTE — Progress Notes (Signed)
Mobility Specialist: Progress Note   03/04/22 1254  Mobility  Activity Ambulated with assistance in hallway  Level of Assistance Moderate assist, patient does 50-74%  Assistive Device Front wheel walker  Distance Ambulated (ft) 40 ft (20'+10'+10')  Activity Response Tolerated well  $Mobility charge 1 Mobility   Pre-Mobility: 101 HR, 91% SpO2 During Mobility: 128 HR Post-Mobility: 110 HR, 91% SpO2  Pt received in the chair and agreeable to mobility. Stopped x2 for seated breaks secondary to general fatigue. No c/o pain or dizziness. Pt back to the chair after session with call bell and phone in reach.   [facility] [place] Zinni Mobility Specialist Mobility Specialist [address]: [phone]

## 2022-03-04 NOTE — Progress Notes (Signed)
  Progress Note    03/04/2022 8:37 AM 6 Days Post-Op  Subjective:  Patient was sleeping comfortably. He woke up and said that everything was good overnight, no pain.    Vitals:   03/04/22 0315 03/04/22 0742  BP: 132/73 124/80  Pulse: 100 92  Resp: 19 20  Temp: 99.1 F (37.3 C) 97.8 F (36.6 C)  SpO2: 97% 94%    Physical Exam: Cardiac:  regular Lungs:  nonlabored Incisions:  L groin incision c/d/l, covered with gauze. L lower leg incisions c/d/l, covered with staples. Extremities:  Brisk L AT and peroneal doppler   CBC    Component Value Date/Time   WBC 15.1 (H) 03/04/2022 0232   RBC 2.90 (L) 03/04/2022 0232   HGB 8.7 (L) 03/04/2022 0232   HCT 27.1 (L) 03/04/2022 0232   PLT 175 03/04/2022 0232   MCV 93.4 03/04/2022 0232   MCH 30.0 03/04/2022 0232   MCHC 32.1 03/04/2022 0232   RDW 14.0 03/04/2022 0232   LYMPHSABS 0.9 02/26/2022 1046   MONOABS 0.8 02/26/2022 1046   EOSABS 0.0 02/26/2022 1046   BASOSABS 0.0 02/26/2022 1046    BMET    Component Value Date/Time   NA 136 03/04/2022 0232   K 4.2 03/04/2022 0232   CL 103 03/04/2022 0232   CO2 24 03/04/2022 0232   GLUCOSE 144 (H) 03/04/2022 0232   BUN 19 03/04/2022 0232   CREATININE 1.11 03/04/2022 0232   CALCIUM 8.4 (L) 03/04/2022 0232   GFRNONAA >60 03/04/2022 0232    INR    Component Value Date/Time   INR 1.2 02/27/2022 0944     Intake/Output Summary (Last 24 hours) at 03/04/2022 0837 Last data filed at 03/04/2022 0600 Gross per 24 hour  Intake 360 ml  Output 3100 ml  Net -2740 ml     Assessment/Plan:  46 y.o. male is 6 days post op, s/p: left iliac thrombectomy and left lower extremity thrombectomy with fasciotomies.  -Patient still clear for discharge from vascular standpoint -L groin and lower leg incisions are c/d/l without drainage -Brisk L peroneal and AT signals -Follow up arranged in 2-3 weeks for incision check and staple removal -Now on Eliquis   Loel Dubonnet, PA-C [facility] [phone] 03/04/2022 8:37 AM

## 2022-03-04 NOTE — Progress Notes (Signed)
Patient placed on full face auto CPAP and tolerating well at this time.  RT will continue to monitor.  

## 2022-03-05 ENCOUNTER — Other Ambulatory Visit (HOSPITAL_COMMUNITY): Payer: Self-pay

## 2022-03-05 DIAGNOSIS — I998 Other disorder of circulatory system: Secondary | ICD-10-CM | POA: Diagnosis not present

## 2022-03-05 LAB — BASIC METABOLIC PANEL
Anion gap: 12 (ref 5–15)
BUN: 22 mg/dL — ABNORMAL HIGH (ref 6–20)
CO2: 27 mmol/L (ref 22–32)
Calcium: 8.4 mg/dL — ABNORMAL LOW (ref 8.9–10.3)
Chloride: 96 mmol/L — ABNORMAL LOW (ref 98–111)
Creatinine, Ser: 1.14 mg/dL (ref 0.61–1.24)
Glucose, Bld: 177 mg/dL — ABNORMAL HIGH (ref 70–99)
Potassium: 4 mmol/L (ref 3.5–5.1)
Sodium: 135 mmol/L (ref 135–145)

## 2022-03-05 LAB — GLUCOSE, CAPILLARY
Glucose-Capillary: 103 mg/dL — ABNORMAL HIGH (ref 70–99)
Glucose-Capillary: 136 mg/dL — ABNORMAL HIGH (ref 70–99)
Glucose-Capillary: 203 mg/dL — ABNORMAL HIGH (ref 70–99)
Glucose-Capillary: 250 mg/dL — ABNORMAL HIGH (ref 70–99)

## 2022-03-05 LAB — CBC
HCT: 27.5 % — ABNORMAL LOW (ref 39.0–52.0)
Hemoglobin: 8.6 g/dL — ABNORMAL LOW (ref 13.0–17.0)
MCH: 29.2 pg (ref 26.0–34.0)
MCHC: 31.3 g/dL (ref 30.0–36.0)
MCV: 93.2 fL (ref 80.0–100.0)
Platelets: 194 10*3/uL (ref 150–400)
RBC: 2.95 MIL/uL — ABNORMAL LOW (ref 4.22–5.81)
RDW: 14.1 % (ref 11.5–15.5)
WBC: 16.2 10*3/uL — ABNORMAL HIGH (ref 4.0–10.5)
nRBC: 0.9 % — ABNORMAL HIGH (ref 0.0–0.2)

## 2022-03-05 MED ORDER — APIXABAN 5 MG PO TABS
5.0000 mg | ORAL_TABLET | Freq: Two times a day (BID) | ORAL | 0 refills | Status: AC
  Filled 2022-03-05 – 2022-09-02 (×3): qty 60, 30d supply, fill #0

## 2022-03-05 MED ORDER — NOVOLOG FLEXPEN 100 UNIT/ML ~~LOC~~ SOPN
10.0000 [IU] | PEN_INJECTOR | Freq: Three times a day (TID) | SUBCUTANEOUS | 1 refills | Status: AC
  Filled 2022-03-05 – 2022-06-16 (×2): qty 15, 50d supply, fill #0

## 2022-03-05 MED ORDER — FREESTYLE LIBRE 2 SENSOR MISC
1.0000 | Freq: Three times a day (TID) | 1 refills | Status: AC
  Filled 2022-03-05: qty 100, fill #0
  Filled 2022-06-16: qty 2, 28d supply, fill #0

## 2022-03-05 MED ORDER — ATORVASTATIN CALCIUM 80 MG PO TABS
80.0000 mg | ORAL_TABLET | Freq: Every day | ORAL | 1 refills | Status: AC
  Filled 2022-03-05 – 2022-09-02 (×3): qty 30, 30d supply, fill #0

## 2022-03-05 MED ORDER — INSULIN PEN NEEDLE 32G X 4 MM MISC
0 refills | Status: AC
  Filled 2022-03-05: qty 100, 25d supply, fill #0

## 2022-03-05 MED ORDER — BASAGLAR KWIKPEN 100 UNIT/ML ~~LOC~~ SOPN
20.0000 [IU] | PEN_INJECTOR | Freq: Two times a day (BID) | SUBCUTANEOUS | 1 refills | Status: AC
  Filled 2022-03-05 – 2022-06-16 (×2): qty 15, 38d supply, fill #0
  Filled 2022-09-02: qty 12, 30d supply, fill #1

## 2022-03-05 MED ORDER — APIXABAN 5 MG PO TABS
10.0000 mg | ORAL_TABLET | Freq: Two times a day (BID) | ORAL | 0 refills | Status: AC
  Filled 2022-03-05: qty 68, 30d supply, fill #0
  Filled 2022-03-05: qty 16, 4d supply, fill #0

## 2022-03-05 MED ORDER — METOPROLOL TARTRATE 50 MG PO TABS
50.0000 mg | ORAL_TABLET | Freq: Two times a day (BID) | ORAL | Status: DC
  Administered 2022-03-05 – 2022-03-08 (×7): 50 mg via ORAL
  Filled 2022-03-05 (×7): qty 1

## 2022-03-05 NOTE — Progress Notes (Signed)
Physical Therapy Treatment Patient Details Name: James Hartman MRN: [id number] DOB: February 10, 1976 Today's Date: 03/05/2022   History of Present Illness The pt is a 46 yo male presenting 6/23 with bilateral leg and foot pain and numbness x3 days. Pt found to have decreased pulses with L foot being cooler than R. S/p L iliac thrombectomy with compartment fasciotomies x4 on 6/23. Hospitalization complicated by RLE DVT and septic shock. PMH includes: HTN, DM II, and morbid obesity.    PT Comments    Pt with continued progress this session with improved tolerance for ambulation. Pt able to complete 3 bouts of longer ambulation with RW and min assist, HR continues to elevate to max 145 bpm during activity; pt with seated recovery and HR decreasing to low 100s with <3 mins rest. Education during ambulation focused on achieving foot flat, pt unable secondary to pain and edema, pacing and breathing techniques, with pt verbalizing understanding and importance. Pt able to demonstrate transfers with less need for rocking and momentum use and increased power through BLE to achieve standing with min assist and RW. Pt continues to benefit from skilled PT services to progress toward functional mobility goals.     Recommendations for follow up therapy are one component of a multi-disciplinary discharge planning process, led by the attending physician.  Recommendations may be updated based on patient status, additional functional criteria and insurance authorization.  Follow Up Recommendations  Home health PT     Assistance Recommended at Discharge Frequent or constant Supervision/Assistance  Patient can return home with the following A lot of help with walking and/or transfers;A lot of help with bathing/dressing/bathroom;Help with stairs or ramp for entrance;Assist for transportation;Assistance with cooking/housework   Equipment Recommendations  None recommended by PT;Other (comment) (patient has necessary  equipment)    Recommendations for Other Services       Precautions / Restrictions Precautions Precautions: Fall Precaution Comments: watch HR Restrictions Weight Bearing Restrictions: No     Mobility  Bed Mobility Overal bed mobility: Needs Assistance (presented in chair) Bed Mobility: Supine to Sit     Supine to sit: Min guard     General bed mobility comments: min guard for safety    Transfers Overall transfer level: Needs assistance Equipment used: Rolling walker (2 wheels) Transfers: Sit to/from Stand Sit to Stand: Min assist           General transfer comment: min a throughout to steadyon rise and facilitate anterior weight shift. Pt with less need for exagerated rocking and momentum this session    Ambulation/Gait Ambulation/Gait assistance: Min assist (with chair follow) Gait Distance (Feet): 90 Feet [phone]) Assistive device: Rolling walker (2 wheels) Gait Pattern/deviations: Step-to pattern, Decreased stride length, Decreased weight shift to left, Knees buckling, Decreased step length - left, Shuffle, Trunk flexed Gait velocity: decr     General Gait Details: pt with slowed steps  cues to slow breathing and improve posture. HR to max 145 with first bout and max 143 bpm with second, 144 bpm with third, all improving with seated rest to low 100s within 3 mins max, pt unable to acheieve foot flat this session depite cues2/2 edema and pain   Stairs             Wheelchair Mobility    Modified Rankin (Stroke Patients Only)       Balance Overall balance assessment: Needs assistance Sitting-balance support: Feet supported Sitting balance-Leahy Scale: Good     Standing balance support: Bilateral upper extremity supported, Reliant  on assistive device for balance, During functional activity Standing balance-Leahy Scale: Fair Standing balance comment: heavy reliance on BUE support on RW during dynamic standing                             Cognition Arousal/Alertness: Awake/alert Behavior During Therapy: WFL for tasks assessed/performed Overall Cognitive Status: Within Functional Limits for tasks assessed                                 General Comments: pt alert and motivated, reinforced education on importance of monitoring HR        Exercises General Exercises - Lower Extremity Ankle Circles/Pumps: AROM, Right, Left, 10 reps    General Comments General comments (skin integrity, edema, etc.): HR to max to 145 bpm with first bout, max 143 bpm with second, 144 bpm with third, all improving with seated rest to low 100s within 3 mins max      Pertinent Vitals/Pain Pain Assessment Pain Assessment: Faces Faces Pain Scale: Hurts little more Pain Location: L LE Pain Descriptors / Indicators: Discomfort, Grimacing, Sore Pain Intervention(s): Monitored during session, Limited activity within patient's tolerance    Home Living                          Prior Function            PT Goals (current goals can now be found in the care plan section) Acute Rehab PT Goals Patient Stated Goal: to go home PT Goal Formulation: With patient Time For Goal Achievement: 03/09/22    Frequency    Min 3X/week      PT Plan Current plan remains appropriate    Co-evaluation              AM-PAC PT "6 Clicks" Mobility   Outcome Measure  Help needed turning from your back to your side while in a flat bed without using bedrails?: A Little Help needed moving from lying on your back to sitting on the side of a flat bed without using bedrails?: A Little Help needed moving to and from a bed to a chair (including a wheelchair)?: A Little Help needed standing up from a chair using your arms (e.g., wheelchair or bedside chair)?: A Little Help needed to walk in hospital room?: A Little Help needed climbing 3-5 steps with a railing? : A Lot 6 Click Score: 17    End of Session   Activity  Tolerance: Patient tolerated treatment well Patient left: in chair;with call bell/phone within reach Nurse Communication: Mobility status (HR elevation) PT Visit Diagnosis: Unsteadiness on feet (R26.81);Other abnormalities of gait and mobility (R26.89);Difficulty in walking, not elsewhere classified (R26.2);Pain;Muscle weakness (generalized) (M62.81) Pain - Right/Left: Left Pain - part of body: Leg     Time: 0539-7673 PT Time Calculation (min) (ACUTE ONLY): 29 min  Charges:  $Gait Training: 8-22 mins $Therapeutic Activity: 8-22 mins                     Copelyn Widmer R. PTA Acute Rehabilitation Services Office: [phone]    Catalina Antigua 03/05/2022, 10:05 AM

## 2022-03-05 NOTE — Progress Notes (Signed)
Mobility Specialist Progress Note    03/05/22 1435  Mobility  Activity Ambulated with assistance in hallway  Level of Assistance Minimal assist, patient does 75% or more  Assistive Device Front wheel walker  Distance Ambulated (ft) 25 ft  Activity Response Tolerated fair  $Mobility charge 1 Mobility   Pre-Mobility: 95 HR During Mobility: 118 HR  Pt received in chair and agreeable. After ~25' pt requesting a seated rest break and was assisted slowly to his knees w/ RN. Leg incision started to bleed. Pt returned to bed in maximove.   Eudora Nation Mobility Specialist

## 2022-03-05 NOTE — Progress Notes (Signed)
Pt places himself on the CPAP.

## 2022-03-05 NOTE — Progress Notes (Addendum)
Mobility tech working with Pt in hallway.  Pt needed to sit down and chair was not ready and waiting for Pt.  Pt had to go to his knees on floor in hallway.  Fall to knees caused moderate bleeding to R medial incision and gauze applied.  Multiple staff came to help and Pt was placed on low chair and then maximove used to return Pt to bed.  Vitals taken and within normal range. MD notified.  Pt currently comfortable and reports no pain or injury

## 2022-03-05 NOTE — Progress Notes (Addendum)
  Progress Note    03/05/2022 7:52 AM 7 Days Post-Op  Subjective:  Patient states his L foot feels swollen and heavy since the surgery.    Vitals:   03/05/22 0025 03/05/22 0439  BP: 112/72 127/85  Pulse: (!) 9 (!) 106  Resp: (!) 22 18  Temp:  99.1 F (37.3 C)  SpO2: 98% 93%    Physical Exam: Cardiac:  regular Lungs:  nonlabored Incisions:  L groin incision c/d/l. L lower leg incisions c/d/l with staples, no discharge or swelling. Extremities:  Brisk L peroneal doppler signal. Some diminished sensation to the L foot, intact motor   CBC    Component Value Date/Time   WBC 16.2 (H) 03/05/2022 0118   RBC 2.95 (L) 03/05/2022 0118   HGB 8.6 (L) 03/05/2022 0118   HCT 27.5 (L) 03/05/2022 0118   PLT 194 03/05/2022 0118   MCV 93.2 03/05/2022 0118   MCH 29.2 03/05/2022 0118   MCHC 31.3 03/05/2022 0118   RDW 14.1 03/05/2022 0118   LYMPHSABS 0.9 02/26/2022 1046   MONOABS 0.8 02/26/2022 1046   EOSABS 0.0 02/26/2022 1046   BASOSABS 0.0 02/26/2022 1046    BMET    Component Value Date/Time   NA 135 03/05/2022 0118   K 4.0 03/05/2022 0118   CL 96 (L) 03/05/2022 0118   CO2 27 03/05/2022 0118   GLUCOSE 177 (H) 03/05/2022 0118   BUN 22 (H) 03/05/2022 0118   CREATININE 1.14 03/05/2022 0118   CALCIUM 8.4 (L) 03/05/2022 0118   GFRNONAA NOT CALCULATED 03/05/2022 0118    INR    Component Value Date/Time   INR 1.2 02/27/2022 0944     Intake/Output Summary (Last 24 hours) at 03/05/2022 0752 Last data filed at 03/05/2022 0438 Gross per 24 hour  Intake 870 ml  Output 4800 ml  Net -3930 ml     Assessment/Plan:  46 y.o. male is 7 days post op, s/p: left iliac thrombectomy and left lower extremity thrombectomy with fasciotomies.    -Clear to discharge from vascular standpoint -L groin and lower leg incisions healing well without complication. Calf is soft, nontender -Brisk L peroneal doppler signal -WBC increased to 16.2 today, patient does not look unwell. -Continue  Eliquis, atorvastatin   Loel Dubonnet, PA-C [facility] [phone] 7:52 AM   I have seen and evaluated the patient. I agree with the PA note as documented above.  S/P left iliac and left lower extremity thrombectomy.  Patient still has brisk triphasic peroneal signal at the left ankle and also much more brisk AT signal.  Right PT brisk by doppler.  Calf is soft and fasciotomies closed with staples.  No significant drainage.  Continue Eliquis.  Does have some discoloration at the tip of the left great toe that we will have to monitor.  Continue work with PT OT mobilization etc.  Also noted to have right leg DVT with PE on admission.  Follow-up arranged with [place] in several weeks for wound checks and staple removal.  I discussed if he goes home and has any further demarcation of his foot he needs to let us know.  Cephus Shelling, MD [facility] [place] Office: [phone]

## 2022-03-05 NOTE — Progress Notes (Signed)
Occupational Therapy Treatment Patient Details Name: James Hartman MRN: [id number] DOB: Jan 14, 1976 Today's Date: 03/05/2022   History of present illness The pt is a 46 yo male presenting 6/23 with bilateral leg and foot pain and numbness x3 days. Pt found to have decreased pulses with L foot being cooler than R. S/p L iliac thrombectomy with compartment fasciotomies x4 on 6/23. Hospitalization complicated by RLE DVT and septic shock. PMH includes: HTN, DM II, and morbid obesity.   OT comments  Pt progressing towards OT goals this session. Pt requiring min A for transfers, increased tolerance for standing ADL at sink this session. HR elevates with standing activity, and pt continues to require at least one UE support on sink, frequently using BUE support on elbows. Pt educated on using seated position as rest break, monitoring HR during ADL activity, and verbally educated on AE for LB ADL. OT will continue to follow acutely. Current POC remains appropriate.    Recommendations for follow up therapy are one component of a multi-disciplinary discharge planning process, led by the attending physician.  Recommendations may be updated based on patient status, additional functional criteria and insurance authorization.    Follow Up Recommendations  Home health OT    Assistance Recommended at Discharge Frequent or constant Supervision/Assistance  Patient can return home with the following  A lot of help with bathing/dressing/bathroom;Assist for transportation;Assistance with cooking/housework;A little help with walking and/or transfers;Help with stairs or ramp for entrance   Equipment Recommendations  None recommended by OT    Recommendations for Other Services PT consult    Precautions / Restrictions Precautions Precautions: Fall Precaution Comments: watch HR Restrictions Weight Bearing Restrictions: No       Mobility Bed Mobility               General bed mobility comments: Pt  OOB in recliner at beginning and end of session    Transfers Overall transfer level: Needs assistance Equipment used: Rolling walker (2 wheels) Transfers: Sit to/from Stand Sit to Stand: Min assist                 Balance Overall balance assessment: Needs assistance Sitting-balance support: Feet supported Sitting balance-Leahy Scale: Good     Standing balance support: Bilateral upper extremity supported, Reliant on assistive device for balance, During functional activity Standing balance-Leahy Scale: Fair Standing balance comment: requires at least one UE in standing during functional activities, frequently flexed posture to allow BUE support on sink surface                           ADL either performed or assessed with clinical judgement   ADL Overall ADL's : Needs assistance/impaired     Grooming: Wash/dry hands;Oral care;Wash/dry face;Min guard;Standing Grooming Details (indicate cue type and reason): sit<>stand sessions with seated rest breaks, leans on sink with BUE Upper Body Bathing: Set up;Sitting Upper Body Bathing Details (indicate cue type and reason): seated at sink Lower Body Bathing: Maximal assistance;Sitting/lateral leans Lower Body Bathing Details (indicate cue type and reason): educated verbally on AE (long handle sponge) Upper Body Dressing : Set up;Sitting Upper Body Dressing Details (indicate cue type and reason): new robe     Toilet Transfer: Minimal assistance;Cueing for safety;Cueing for sequencing;Rolling walker (2 wheels)           Functional mobility during ADLs: Minimal assistance;Min guard;Rolling walker (2 wheels) General ADL Comments: Pt demonstrating increased tolerance for standing ADL- but requires UE support  due to pain. HR increases with activity, and Pt educated in energy conservation for increasing activity tolerance in conjunction with monitoring HR    Extremity/Trunk Assessment Upper Extremity Assessment Upper  Extremity Assessment: Generalized weakness   Lower Extremity Assessment Lower Extremity Assessment: Defer to PT evaluation        Vision       Perception     Praxis      Cognition Arousal/Alertness: Awake/alert Behavior During Therapy: WFL for tasks assessed/performed Overall Cognitive Status: Within Functional Limits for tasks assessed                                 General Comments: pt alert and motivated, reinforced education on importance of monitoring HR        Exercises      Shoulder Instructions       General Comments HR elevated to 142 with first standing at sink, decreased with 5 min seated activity, back up to 141 with second, followed by seated rest, and then ambulation went back to 143    Pertinent Vitals/ Pain       Pain Assessment Pain Assessment: Faces Faces Pain Scale: Hurts little more Pain Location: L LE Pain Descriptors / Indicators: Discomfort, Grimacing, Sore Pain Intervention(s): Monitored during session, Repositioned  Home Living                                          Prior Functioning/Environment              Frequency  Min 2X/week        Progress Toward Goals  OT Goals(current goals can now be found in the care plan section)  Progress towards OT goals: Progressing toward goals  Acute Rehab OT Goals Patient Stated Goal: get stronger each day OT Goal Formulation: With patient Time For Goal Achievement: 03/16/22 Potential to Achieve Goals: Good  Plan Discharge plan remains appropriate;Frequency remains appropriate    Co-evaluation                 AM-PAC OT "6 Clicks" Daily Activity     Outcome Measure   Help from another person eating meals?: None Help from another person taking care of personal grooming?: A Little Help from another person toileting, which includes using toliet, bedpan, or urinal?: A Little Help from another person bathing (including washing, rinsing,  drying)?: A Lot Help from another person to put on and taking off regular upper body clothing?: A Little Help from another person to put on and taking off regular lower body clothing?: A Lot 6 Click Score: 17    End of Session    OT Visit Diagnosis: Unsteadiness on feet (R26.81);Other abnormalities of gait and mobility (R26.89);Muscle weakness (generalized) (M62.81);Pain Pain - part of body: Leg   Activity Tolerance     Patient Left     Nurse Communication          Time: 6734-1937 OT Time Calculation (min): 25 min  Charges: OT General Charges $OT Visit: 1 Visit OT Treatments $Self Care/Home Management : 23-37 mins  Nyoka Cowden OTR/L Acute Rehabilitation Services Office: [phone]  Evern Bio [facility] 03/05/2022, 1:07 PM

## 2022-03-05 NOTE — Progress Notes (Signed)
PROGRESS NOTE  James Hartman  YSA:[id number] DOB: [birth year] DOA: 02/26/2022 PCP: [facility]   Brief Narrative: Patient is a 46 year old male with history of hypertension, diabetes type 2, morbid obesity, neurosarcoidosis versus granulomatous lymphadenitis who initially presented at [facility] hospital with complaint of bilateral lower extremity pain, swelling.  He was found to have left leg arterial clot with concern for limb ischemia and was transferred to [facility].  Status post thrombectomy/fasciotomy of left leg.  He was on heparin drip.  Hospital course remarkable for postoperative hypotension, fever, had to be transferred to ICU on pressors.  Also started on pressors, antibiotics.  Currently hemodynamically stable but has persistent sinus tachycardia.  Found to have severe volume overload, started on IV Lasix.  Plan for discharge to home after improvement in the volume status.  Assessment & Plan:  Principal Problem:   Acute lower limb ischemia with multifocal high grade stenosis or occlusion indicative of showering emboli  Active Problems:   Septic shock (HCC)   AKI (acute kidney injury) (HCC)   Acute pulmonary embolism and right LE DVT    Leukocytosis   Type 2 diabetes mellitus (HCC)   Neurosarcoidosis   Essential hypertension   Moderate persistent asthma   Normocytic anemia   Mixed hyperlipidemia   Obstructive sleep apnea   Granulomatous lymphadenitis   DVT (deep venous thrombosis) (HCC)  Acute lower extremity ischemia with multifocal high-grade stenosis/occlusion indicative of scarring emboli on the left: Vascular surgery was consulted.  Status post thrombectomy, fasciotomy.  Vascular surgery cleared for discharge.  He was on heparin drip, being transitioned to Eliquis.  He will follow-up with vascular surgery in 2 to 3 weeks for suture removal/follow-up  Volume overload/Diastolic congestive heart failure: patient got tons of fluid when he was in ICU.   Elevated BNP.  Looks grossly volume overloaded with bilateral lower extremity edema, upper extremity edema.  Continue Lasix 60 mg IV twice a day.  Monitor BMP Echocardiogram done on 5/23 showed EF of more than 75%, grade 1 diastolic dysfunction.  AKI: Creatinine peaked at 2.8 likely from ATN/hypotension.  Currently stable.  Septic shock/UTI: Required pressors while in ICU.  Currently off pressors, blood pressure stable.  Afebrile.  Still has leukocytosis, this is most likely secondary to steroids.  Urine culture showed Enterococcus, treated with vancomycin.  Acute PE/right DVT: Incidental finding on CT angio, associated with right lower extremity DVT.  On heparin drip, now on Eliquis  Diabetes type 2/hyperglycemia: Hemoglobin A1c of 10.4 diarrhea coronary was following.  Takes glipizide, metformin at home.  Currently on insulin and sliding scale.  He needs insulin on discharge.  Planning to discharge with Basaglar 20 units BID, and Novolog 10 units TID.  Neurosarcoidosis: Follows at [facility].  On prednisone 60 mg daily.  On outpatient PCP prophylaxis with Bactrim, follows with Dr. Algis Liming  Hypertension: Hospital course remarkable for hypotension.  On Diovan and hydrochlorothiazide at home, currently on hold  Sinus tachycardia: Unclear etiology,likely due to volume overload.  TSH normal.  Recent echo showed EF of more than 75%, grade 1 diastolic dysfunction.  We will continue to monitor on telemetry.  Added low dose metoprolol  Moderate persistent asthma: Continue bronchodilators.  Currently on room air  Normocytic anemia: Currently hemoglobin stable  Hyperlipidemia: On statin  History of OSA: On CPAP at night  Morbid obesity: BMI 45.3          DVT prophylaxis: apixaban (ELIQUIS) tablet 10 mg  apixaban (ELIQUIS) tablet 5 mg  Code Status: Full Code  Family Communication: None at the bedside  Patient status: Inpatient  Patient is from : Home  Anticipated discharge to:  Home  Estimated DC date: Tomorrow if improvement  in the volume status   Consultants: Vascular surgery  Procedures: As above  Antimicrobials:  Anti-infectives (From admission, onward)    Start     Dose/Rate Route Frequency Ordered Stop   03/01/22 0800  ceFEPIme (MAXIPIME) 2 g in sodium chloride 0.9 % 100 mL IVPB  Status:  Discontinued        2 g 200 mL/hr over 30 Minutes Intravenous Every 8 hours 03/01/22 0734 03/02/22 1135   03/01/22 0800  vancomycin (VANCOCIN) IVPB 1000 mg/200 mL premix        1,000 mg 200 mL/hr over 60 Minutes Intravenous Every 12 hours 03/01/22 0740 03/03/22 2106   02/28/22 1515  vancomycin (VANCOREADY) IVPB 1250 mg/250 mL  Status:  Discontinued        1,250 mg 166.7 mL/hr over 90 Minutes Intravenous Every 24 hours 02/28/22 1421 03/01/22 0740   02/28/22 1400  vancomycin (VANCOCIN) IVPB 1000 mg/200 mL premix  Status:  Discontinued        1,000 mg 200 mL/hr over 60 Minutes Intravenous Every 24 hours 02/27/22 1245 02/28/22 0809   02/28/22 1000  sulfamethoxazole-trimethoprim (BACTRIM DS) 800-160 MG per tablet 1 tablet        1 tablet Oral Daily 02/28/22 0819     02/27/22 2200  vancomycin (VANCOCIN) IVPB 1000 mg/200 mL premix  Status:  Discontinued        1,000 mg 200 mL/hr over 60 Minutes Intravenous Every 24 hours 02/27/22 1003 02/27/22 1245   02/27/22 1330  vancomycin (VANCOREADY) IVPB 2000 mg/400 mL        2,000 mg 200 mL/hr over 120 Minutes Intravenous  Once 02/27/22 1245 02/27/22 1458   02/27/22 1100  vancomycin (VANCOREADY) IVPB 2000 mg/400 mL  Status:  Discontinued        2,000 mg 200 mL/hr over 120 Minutes Intravenous  Once 02/27/22 1000 02/27/22 1245   02/27/22 1030  ceFEPIme (MAXIPIME) 2 g in sodium chloride 0.9 % 100 mL IVPB  Status:  Discontinued        2 g 200 mL/hr over 30 Minutes Intravenous Every 12 hours 02/27/22 0958 03/01/22 0734   02/27/22 1015  ceFEPIme (MAXIPIME) 2 g in sodium chloride 0.9 % 100 mL IVPB  Status:  Discontinued        2  g 200 mL/hr over 30 Minutes Intravenous  Once 02/27/22 0925 02/27/22 0958   02/27/22 1015  metroNIDAZOLE (FLAGYL) IVPB 500 mg  Status:  Discontinued        500 mg 100 mL/hr over 60 Minutes Intravenous Every 12 hours 02/27/22 0925 03/02/22 1135   02/27/22 1015  vancomycin (VANCOCIN) IVPB 1000 mg/200 mL premix  Status:  Discontinued        1,000 mg 200 mL/hr over 60 Minutes Intravenous  Once 02/27/22 0925 02/27/22 0956   02/26/22 1435  ceFAZolin (ANCEF) 2-4 GM/100ML-% IVPB       Note to Pharmacy: Cameron Sprang M: cabinet override      02/26/22 1435 02/27/22 0244   02/26/22 1433  ceFAZolin (ANCEF) 3-0.9 GM/100ML-% IVPB       Note to Pharmacy: Valda Lamb J: cabinet override      02/26/22 1433 02/27/22 0244       Subjective:  Patient seen and examined at the bedside this morning.  He still  appears well overloaded with bilateral lower extremity swelling.  Remains in sinus tachycardia.  Objective: Vitals:   03/04/22 2324 03/05/22 0025 03/05/22 0439 03/05/22 0759  BP: 112/72 112/72 127/85 (!) 116/58  Pulse: 96 (!) 9 (!) 106 (!) 102  Resp: 20 (!) 22 18 18   Temp: 97.8 F (36.6 C)  99.1 F (37.3 C) 98.3 F (36.8 C)  TempSrc: Oral  Oral Oral  SpO2: 96% 98% 93% 96%  Weight:      Height:        Intake/Output Summary (Last 24 hours) at 03/05/2022 1123 Last data filed at 03/05/2022 0801 Gross per 24 hour  Intake 866 ml  Output 5100 ml  Net -4234 ml   Filed Weights   02/26/22 1012 02/26/22 2019 03/03/22 0312  Weight: 131.5 kg 131 kg 135.3 kg    Examination:  General exam: Overall comfortable, not in distress,obese HEENT: PERRL Respiratory system:  no wheezes or crackles  Cardiovascular system: Sinus tachycardia Gastrointestinal system: Abdomen is nondistended, soft and nontender. Central nervous system: Alert and oriented Extremities: Bilateral lower extremity pitting edema, no clubbing ,no cyanosis, staples on the left lower extremity Skin: No rashes, no ulcers,no  icterus     Data Reviewed: I have personally reviewed following labs and imaging studies  CBC: Recent Labs  Lab 03/01/22 0224 03/02/22 0130 03/03/22 0104 03/04/22 0232 03/05/22 0118  WBC 13.9* 13.9* 15.4* 15.1* 16.2*  HGB 9.2* 8.9* 9.0* 8.7* 8.6*  HCT 29.9* 27.8* 28.3* 27.1* 27.5*  MCV 94.3 93.3 92.8 93.4 93.2  PLT 134* 130* 170 175 [id number]   Basic Metabolic Panel: Recent Labs  Lab 02/28/22 0011 03/01/22 0224 03/02/22 0130 03/03/22 0104 03/04/22 0232 03/05/22 0118  NA 135 132* 134* 138 136 135  K 4.4 4.9 4.5 4.6 4.2 4.0  CL 102 101 102 103 103 96*  CO2 20* 24 23 22 24 27   GLUCOSE 164* 265* 205* 148* 144* 177*  BUN 31* 16 20 22* 19 22*  CREATININE 2.32* 1.20 1.11 1.11 1.11 1.14  CALCIUM 7.7* 8.1* 8.2* 8.6* 8.4* 8.4*  MG 2.0  --  2.3 2.4  --   --   PHOS 6.3*  --   --   --   --   --      Recent Results (from the past 240 hour(s))  Culture, blood (x 2)     Status: None   Collection Time: 02/27/22  9:39 AM   Specimen: BLOOD  Result Value Ref Range Status   Specimen Description BLOOD RIGHT ANTECUBITAL  Final   Special Requests   Final    BOTTLES DRAWN AEROBIC AND ANAEROBIC Blood Culture adequate volume   Culture   Final    NO GROWTH 5 DAYS Performed at [facility], [address] [place], [place] [address]    Report Status 03/04/2022 FINAL  Final  Culture, blood (x 2)     Status: None   Collection Time: 02/27/22  9:53 AM   Specimen: BLOOD  Result Value Ref Range Status   Specimen Description BLOOD RIGHT ANTECUBITAL  Final   Special Requests   Final    BOTTLES DRAWN AEROBIC AND ANAEROBIC Blood Culture adequate volume   Culture   Final    NO GROWTH 5 DAYS Performed at [facility], [address] [place], [place] [address]    Report Status 03/04/2022 FINAL  Final  MRSA Next Gen by PCR, Nasal     Status: None   Collection Time: 02/27/22  12:04 PM   Specimen: Nasal Mucosa; Nasal Swab  Result Value Ref Range Status   MRSA by PCR Next Gen NOT  DETECTED NOT DETECTED Final    Comment: (NOTE) The GeneXpert MRSA Assay (FDA approved for NASAL specimens only), is one component of a comprehensive MRSA colonization surveillance program. It is not intended to diagnose MRSA infection nor to guide or monitor treatment for MRSA infections. Test performance is not FDA approved in patients less than 88 years old. Performed at [facility], [address] [place], [place] [address]   Urine Culture     Status: Abnormal   Collection Time: 02/27/22 12:45 PM   Specimen: Urine, Catheterized  Result Value Ref Range Status   Specimen Description URINE, CATHETERIZED  Final   Special Requests   Final    NONE Performed at [facility], [address] [place], [place] [address]    Culture 20,000 COLONIES/mL ENTEROCOCCUS FAECALIS (A)  Final   Report Status 03/01/2022 FINAL  Final   Organism ID, Bacteria ENTEROCOCCUS FAECALIS (A)  Final      Susceptibility   Enterococcus faecalis - MIC*    AMPICILLIN <=2 SENSITIVE Sensitive     NITROFURANTOIN <=16 SENSITIVE Sensitive     VANCOMYCIN 1 SENSITIVE Sensitive     * 20,000 COLONIES/mL ENTEROCOCCUS FAECALIS     Radiology Studies: DG CHEST PORT 1 VIEW  Result Date: 03/03/2022 CLINICAL DATA:  Tachypnea EXAM: PORTABLE CHEST 1 VIEW COMPARISON:  02/27/2022 FINDINGS: The heart size and mediastinal contours are stable. No focal airspace consolidation, pleural effusion, or pneumothorax. The visualized skeletal structures are unremarkable. IMPRESSION: No active disease. Electronically Signed   By: Duanne Guess D.O.   On: 03/03/2022 13:10    Scheduled Meds:  apixaban  10 mg Oral BID   Followed by   Melene Muller ON 03/10/2022] apixaban  5 mg Oral BID   atorvastatin  80 mg Oral Daily   DULoxetine  60 mg Oral BID   folic acid  1 mg Oral Daily   furosemide  60 mg Intravenous Q12H   gabapentin  900 mg Oral TID   insulin aspart  0-15 Units Subcutaneous TID WC   insulin aspart  0-5 Units Subcutaneous  QHS   insulin aspart  8 Units Subcutaneous TID WC   insulin glargine-yfgn  20 Units Subcutaneous BID   metoprolol tartrate  50 mg Oral BID   nortriptyline  10 mg Oral QHS   predniSONE  60 mg Oral Q breakfast   sodium chloride flush  3 mL Intravenous Q12H   sulfamethoxazole-trimethoprim  1 tablet Oral Daily   Continuous Infusions:  sodium chloride       LOS: 7 days   Burnadette Pop, MD [facility] P6/30/2023, 11:23 AM

## 2022-03-05 NOTE — Progress Notes (Signed)
03/05/22 1421  What Happened  Was fall witnessed? Yes  Who witnessed fall? Eriyanna Kofoed RN  Patients activity before fall ambulating-assisted (Working with mobility tech in hallway)  Point of contact other (comment) (Knees)  Was patient injured? No  Follow Up  MD notified Hawken MD,  Time MD notified 1430  Additional tests No  Simple treatment Dressing  Progress note created (see row info) Yes  Adult Fall Risk Assessment  Risk Factor Category (scoring not indicated) Not Applicable  Age 46  Fall History: Fall within 6 months prior to admission 0  Elimination; Bowel and/or Urine Incontinence 0  Elimination; Bowel and/or Urine Urgency/Frequency 2  Medications: includes PCA/Opiates, Anti-convulsants, Anti-hypertensives, Diuretics, Hypnotics, Laxatives, Sedatives, and Psychotropics 5  Patient Care Equipment 2  Mobility-Assistance 2  Mobility-Gait 2  Mobility-Sensory Deficit 0  Altered awareness of immediate physical environment 0  Impulsiveness 0  Lack of understanding of one's physical/cognitive limitations 0  Total Score 13  Patient Fall Risk Level Moderate fall risk  Adult Fall Risk Interventions  Required Bundle Interventions *See Row Information* Moderate fall risk - low and moderate requirements implemented  Additional Interventions PT/OT need assessed if change in mobility from baseline;Room near nurses station;Use of appropriate toileting equipment (bedpan, BSC, etc.)  Vitals  Temp 98.2 F (36.8 C)  Temp Source Oral  BP 127/69  MAP (mmHg) 86  BP Location Left Arm  BP Method Automatic  Patient Position (if appropriate) Lying  Pulse Rate Source Monitor  ECG Heart Rate (!) 102  Cardiac Rhythm ST  Resp 20  Oxygen Therapy  SpO2 98 %  O2 Device Room Air  Patient Activity (if Appropriate) In bed  Pulse Oximetry Type Intermittent  Pain Assessment  Pain Scale 0-10  Pain Score 0  PCA/Epidural/Spinal Assessment  Respiratory Pattern Regular;Unlabored  Neurological  Neuro  (WDL) WDL  Glasgow Coma Scale  Eye Opening 4  Best Verbal Response (NON-intubated) 5  Best Motor Response 6  Glasgow Coma Scale Score 15  Musculoskeletal  Musculoskeletal (WDL) X  Assistive Device Front wheel walker  Generalized Weakness Yes  Weight Bearing Restrictions No  Musculoskeletal Details  LLE Swelling;Surgery  Integumentary  Integumentary (WDL) X  Skin Color Appropriate for ethnicity  Skin Condition Dry  Skin Integrity Blister  Description of Blister Serous  Blister Location Foot  Blister Location Orientation Left  Skin Turgor Non-tenting  Pain Assessment  Date Pain First Started  (No Pain)    03/05/22 1421  What Happened  Was fall witnessed? Yes  Who witnessed fall? Jaeline Whobrey RN  Patients activity before fall ambulating-assisted (Working with mobility tech in hallway)  Point of contact other (comment) (Knees)  Was patient injured? No  Follow Up  MD notified Hawken MD,  Time MD notified 1430  Additional tests No  Simple treatment Dressing  Progress note created (see row info) Yes  Adult Fall Risk Assessment  Risk Factor Category (scoring not indicated) Not Applicable  Age 46  Fall History: Fall within 6 months prior to admission 0  Elimination; Bowel and/or Urine Incontinence 0  Elimination; Bowel and/or Urine Urgency/Frequency 2  Medications: includes PCA/Opiates, Anti-convulsants, Anti-hypertensives, Diuretics, Hypnotics, Laxatives, Sedatives, and Psychotropics 5  Patient Care Equipment 2  Mobility-Assistance 2  Mobility-Gait 2  Mobility-Sensory Deficit 0  Altered awareness of immediate physical environment 0  Impulsiveness 0  Lack of understanding of one's physical/cognitive limitations 0  Total Score 13  Patient Fall Risk Level Moderate fall risk  Adult Fall Risk Interventions  Required Bundle Interventions *  See Row Information* Moderate fall risk - low and moderate requirements implemented  Additional Interventions PT/OT need assessed if change in  mobility from baseline;Room near nurses station;Use of appropriate toileting equipment (bedpan, BSC, etc.)  Vitals  Temp 98.2 F (36.8 C)  Temp Source Oral  BP 127/69  MAP (mmHg) 86  BP Location Left Arm  BP Method Automatic  Patient Position (if appropriate) Lying  Pulse Rate Source Monitor  ECG Heart Rate (!) 102  Cardiac Rhythm ST  Resp 20  Oxygen Therapy  SpO2 98 %  O2 Device Room Air  Patient Activity (if Appropriate) In bed  Pulse Oximetry Type Intermittent  Pain Assessment  Pain Scale 0-10  Pain Score 0  PCA/Epidural/Spinal Assessment  Respiratory Pattern Regular;Unlabored  Neurological  Neuro (WDL) WDL  Glasgow Coma Scale  Eye Opening 4  Best Verbal Response (NON-intubated) 5  Best Motor Response 6  Glasgow Coma Scale Score 15  Musculoskeletal  Musculoskeletal (WDL) X  Assistive Device Front wheel walker  Generalized Weakness Yes  Weight Bearing Restrictions No  Musculoskeletal Details  LLE Swelling;Surgery  Integumentary  Integumentary (WDL) X  Skin Color Appropriate for ethnicity  Skin Condition Dry  Skin Integrity Blister  Description of Blister Serous  Blister Location Foot  Blister Location Orientation Left  Skin Turgor Non-tenting  Pain Assessment  Date Pain First Started  (No Pain)

## 2022-03-05 NOTE — Inpatient Diabetes Management (Signed)
Followed up with patient.  He is currently using CGM and states it is easy to use.  States it is helping him see how his blood sugars increase after eating.  Will follow.   Thanks,  Beryl Meager, RN, BC-ADM Inpatient Diabetes Coordinator Pager [phone]  (8a-5p)

## 2022-03-06 LAB — BASIC METABOLIC PANEL
Anion gap: 11 (ref 5–15)
BUN: 23 mg/dL — ABNORMAL HIGH (ref 6–20)
CO2: 28 mmol/L (ref 22–32)
Calcium: 8.6 mg/dL — ABNORMAL LOW (ref 8.9–10.3)
Chloride: 97 mmol/L — ABNORMAL LOW (ref 98–111)
Creatinine, Ser: 1.11 mg/dL (ref 0.61–1.24)
GFR, Estimated: >60 mL/min (ref >60)
Glucose, Bld: 149 mg/dL — ABNORMAL HIGH (ref 70–99)
Potassium: 4.2 mmol/L (ref 3.5–5.1)
Sodium: 136 mmol/L (ref 135–145)

## 2022-03-06 LAB — GLUCOSE, CAPILLARY
Glucose-Capillary: 147 mg/dL — ABNORMAL HIGH (ref 70–99)
Glucose-Capillary: 197 mg/dL — ABNORMAL HIGH (ref 70–99)
Glucose-Capillary: 263 mg/dL — ABNORMAL HIGH (ref 70–99)
Glucose-Capillary: 190 mg/dL — ABNORMAL HIGH (ref 70–99)

## 2022-03-06 NOTE — Progress Notes (Addendum)
  Progress Note    03/06/2022 8:31 AM 8 Days Post-Op  Subjective:  sitting up eating breakfast, no complaints. Explains he has a lot of family help/ support at home and would feel comfortable going home with [facility] PT   Vitals:   03/06/22 0432 03/06/22 0744  BP: 107/64 121/69  Pulse: 82 99  Resp: 20 17  Temp: 98.7 F (37.1 C) 98.6 F (37 C)  SpO2: 91% 96%   Physical Exam: Cardiac:  regular Lungs:  non labored Incisions:  Left groin, left leg incision c/d/I. Left calf soft.  Extremities:  well perfused and warm. Doppler Left Peroneal and PT signal present. Diminished sensation but motor intact Neurologic: alert and oriented  CBC    Component Value Date/Time   WBC 16.2 (H) 03/05/2022 0118   RBC 2.95 (L) 03/05/2022 0118   HGB 8.6 (L) 03/05/2022 0118   HCT 27.5 (L) 03/05/2022 0118   PLT 194 03/05/2022 0118   MCV 93.2 03/05/2022 0118   MCH 29.2 03/05/2022 0118   MCHC 31.3 03/05/2022 0118   RDW 14.1 03/05/2022 0118   LYMPHSABS 0.9 02/26/2022 1046   MONOABS 0.8 02/26/2022 1046   EOSABS 0.0 02/26/2022 1046   BASOSABS 0.0 02/26/2022 1046    BMET    Component Value Date/Time   NA 136 03/06/2022 0130   K 4.2 03/06/2022 0130   CL 97 (L) 03/06/2022 0130   CO2 28 03/06/2022 0130   GLUCOSE 149 (H) 03/06/2022 0130   BUN 23 (H) 03/06/2022 0130   CREATININE 1.11 03/06/2022 0130   CALCIUM 8.6 (L) 03/06/2022 0130   GFRNONAA >60 03/06/2022 0130    INR    Component Value Date/Time   INR 1.2 02/27/2022 0944     Intake/Output Summary (Last 24 hours) at 03/06/2022 0831 Last data filed at 03/06/2022 0438 Gross per 24 hour  Intake 596 ml  Output 2950 ml  Net -2354 ml     Assessment/Plan:  46 y.o. male is s/p left iliac thrombectomy and left lower extremity thrombectomy with fasciotomies. 8 Days Post-Op   L groin and lower leg incisions healing well without complication. Calf remains soft. Diminished sensation still in left foot/ leg, motor intact Brisk L peroneal doppler  signal Left great toe will allow to demarcate Continue to mobilize as tolerated Continue Eliquis, atorvastatin Okay for discharge from vascular standpoint Has outpatient follow up arranged in 2-3 weeks   Graceann Congress, PA-C [facility] [phone] 03/06/2022 8:31 AM  VASCULAR STAFF ADDENDUM: I have independently interviewed and examined the patient. I agree with the above.   Rande Brunt. Lenell Antu, MD [facility] Phone Number: [phone] 03/06/2022 8:56 AM

## 2022-03-06 NOTE — Progress Notes (Signed)
Physical Therapy Treatment Patient Details Name: James Hartman MRN: [id number] DOB: 10/21/1975 Today's Date: 03/06/2022   History of Present Illness The pt is a 46 yo male presenting 6/23 with bilateral leg and foot pain and numbness x3 days. Pt found to have decreased pulses with L foot being cooler than R. S/p L iliac thrombectomy with compartment fasciotomies x4 on 6/23. Hospitalization complicated by RLE DVT and septic shock. PMH includes: HTN, DM II, and morbid obesity.    PT Comments    Concentrated today on short household distances with walker. Pt lowered to floor yesterday when knees buckled with mobility specialist. Reinforced to pt that he needs to have someone with him when ambulating at home and to sit and rest before he feels like his legs are giving out on him. For any longer household distances or community distances recommend use of w/c. Per pt he has supportive family at home that can assist him.    Recommendations for follow up therapy are one component of a multi-disciplinary discharge planning process, led by the attending physician.  Recommendations may be updated based on patient status, additional functional criteria and insurance authorization.  Follow Up Recommendations  Home health PT     Assistance Recommended at Discharge Frequent or constant Supervision/Assistance  Patient can return home with the following Help with stairs or ramp for entrance;Assist for transportation;Assistance with cooking/housework;A little help with walking and/or transfers   Equipment Recommendations  None recommended by PT;Other (comment) (patient has necessary equipment)    Recommendations for Other Services       Precautions / Restrictions Precautions Precautions: Fall Restrictions Weight Bearing Restrictions: No     Mobility  Bed Mobility               General bed mobility comments: Pt up in chair    Transfers Overall transfer level: Needs assistance Equipment  used: Rolling walker (2 wheels) Transfers: Sit to/from Stand Sit to Stand: Min assist           General transfer comment: Assist to steady as rising    Ambulation/Gait Ambulation/Gait assistance: Min guard, +2 safety/equipment (with chair follow) Gait Distance (Feet): 25 Feet (25' x 1, 15' x 1) Assistive device: Rolling walker (2 wheels) Gait Pattern/deviations: Step-to pattern, Decreased stride length, Decreased weight shift to left, Decreased step length - left, Trunk flexed Gait velocity: decr Gait velocity interpretation: <1.31 ft/sec, indicative of household ambulator   General Gait Details: Assist for safety and 2nd person for chair follow. Pt with heavy reliance omn UE's   Stairs             Wheelchair Mobility    Modified Rankin (Stroke Patients Only)       Balance Overall balance assessment: Needs assistance Sitting-balance support: Feet supported Sitting balance-Leahy Scale: Good     Standing balance support: Bilateral upper extremity supported, Reliant on assistive device for balance, During functional activity Standing balance-Leahy Scale: Poor Standing balance comment: walker and min guard for static standing                            Cognition Arousal/Alertness: Awake/alert Behavior During Therapy: WFL for tasks assessed/performed Overall Cognitive Status: Within Functional Limits for tasks assessed  Exercises      General Comments        Pertinent Vitals/Pain Pain Assessment Pain Assessment: Faces Faces Pain Scale: Hurts little more Pain Location: L LE Pain Descriptors / Indicators: Grimacing, Sore Pain Intervention(s): Monitored during session    Home Living                          Prior Function            PT Goals (current goals can now be found in the care plan section) Acute Rehab PT Goals Patient Stated Goal: to go home Progress towards  PT goals: Progressing toward goals    Frequency    Min 3X/week      PT Plan Current plan remains appropriate    Co-evaluation              AM-PAC PT "6 Clicks" Mobility   Outcome Measure  Help needed turning from your back to your side while in a flat bed without using bedrails?: A Little Help needed moving from lying on your back to sitting on the side of a flat bed without using bedrails?: A Little Help needed moving to and from a bed to a chair (including a wheelchair)?: A Little Help needed standing up from a chair using your arms (e.g., wheelchair or bedside chair)?: A Little Help needed to walk in hospital room?: A Little Help needed climbing 3-5 steps with a railing? : Total 6 Click Score: 16    End of Session Equipment Utilized During Treatment: Gait belt Activity Tolerance: Patient tolerated treatment well Patient left: in chair;with call bell/phone within reach;with chair alarm set Nurse Communication: Mobility status PT Visit Diagnosis: Unsteadiness on feet (R26.81);Other abnormalities of gait and mobility (R26.89);Difficulty in walking, not elsewhere classified (R26.2);Pain;Muscle weakness (generalized) (M62.81) Pain - Right/Left: Left Pain - part of body: Leg     Time: 6644-0347 PT Time Calculation (min) (ACUTE ONLY): 16 min  Charges:  $Gait Training: 8-22 mins                     [facility] PT Acute Rehabilitation Services Office [phone]    Angelina [facility] 03/06/2022, 1:04 PM

## 2022-03-06 NOTE — Progress Notes (Signed)
PROGRESS NOTE  James Hartman  [id number] DOB: 1976-02-23 DOA: 02/26/2022 PCP: [facility]   Brief Narrative: Patient is a 46 year old male with history of hypertension, diabetes type 2, morbid obesity, neurosarcoidosis versus granulomatous lymphadenitis who initially presented at [facility] hospital with complaint of bilateral lower extremity pain, swelling.  He was found to have left leg arterial clot with concern for limb ischemia and was transferred to [facility].  Status post thrombectomy/fasciotomy of left leg.  He was on heparin drip.  Hospital course remarkable for postoperative hypotension, fever, had to be transferred to ICU on pressors.  Also started on pressors, antibiotics.  Currently hemodynamically stable but has persistent sinus tachycardia.  Found to have severe volume overload, started on IV Lasix.  Plan for discharge to home after improvement in the volume status.  Assessment & Plan:  Principal Problem:   Acute lower limb ischemia with multifocal high grade stenosis or occlusion indicative of showering emboli  Active Problems:   Septic shock (HCC)   AKI (acute kidney injury) ([place])   Acute pulmonary embolism and right LE DVT    Leukocytosis   Type 2 diabetes mellitus (HCC)   Neurosarcoidosis   Essential hypertension   Moderate persistent asthma   Normocytic anemia   Mixed hyperlipidemia   Obstructive sleep apnea   Granulomatous lymphadenitis   DVT (deep venous thrombosis) (HCC)  Acute lower extremity ischemia with multifocal high-grade stenosis/occlusion indicative of scarring emboli on the left: Vascular surgery was consulted.  Status post thrombectomy, fasciotomy.  Vascular surgery cleared for discharge.  He was on heparin drip, being transitioned to Eliquis.  He will follow-up with vascular surgery in 2 to 3 weeks for suture removal/follow-up  Volume overload/Diastolic congestive heart failure: patient got tons of fluid when he was in ICU.   Elevated BNP.  Looks grossly volume overloaded with bilateral lower extremity edema, upper extremity edema.  Continue Lasix 60 mg IV twice a day.  Monitor BMP Echocardiogram done on 5/23 showed EF of more than [id number], grade 1 diastolic dysfunction.  AKI: Creatinine peaked at 2.8 likely from ATN/hypotension.  Currently stable.  Septic shock/UTI: Required pressors while in ICU.  Currently off pressors, blood pressure stable.  Afebrile.  Still has leukocytosis, this is most likely secondary to steroids.  Urine culture showed Enterococcus, treated with vancomycin.  Acute PE/right DVT: Incidental finding on CT angio, associated with right lower extremity DVT.  On heparin drip, now on Eliquis  Diabetes type 2/hyperglycemia: Hemoglobin A1c of 10.4 diarrhea coronary was following.  Takes glipizide, metformin at home.  Currently on insulin and sliding scale.  He needs insulin on discharge.  Planning to discharge with Basaglar 20 units BID, and Novolog 10 units TID.  Neurosarcoidosis: Follows at [facility].  On prednisone 60 mg daily.  On outpatient PCP prophylaxis with Bactrim, follows with Dr. Drucilla Schmidt  Hypertension: Hospital course remarkable for hypotension.  On Diovan and hydrochlorothiazide at home, currently on hold  Sinus tachycardia: Unclear etiology,likely due to volume overload.  TSH normal.  Recent echo showed EF of more than [id number], grade 1 diastolic dysfunction.  We will continue to monitor on telemetry.  Added low dose metoprolol  Moderate persistent asthma: Continue bronchodilators.  Currently on room air  Normocytic anemia: Currently hemoglobin stable  Hyperlipidemia: On statin  History of OSA: On CPAP at night  Morbid obesity: BMI 45.3  Debility/deconditioning: Patient seen by PT and OT and recommended home health on discharge.  As per the report, he fell yesterday  while ambulating.          DVT prophylaxis: apixaban (ELIQUIS) tablet 10 mg  apixaban (ELIQUIS) tablet 5 mg     Code  Status: Full Code  Family Communication: None at the bedside  Patient status: Inpatient  Patient is from : Home  Anticipated discharge to: Home  Estimated DC date: Tomorrow if improvement  in the volume status   Consultants: Vascular surgery  Procedures: As above  Antimicrobials:  Anti-infectives (From admission, onward)    Start     Dose/Rate Route Frequency Ordered Stop   03/01/22 0800  ceFEPIme (MAXIPIME) 2 g in sodium chloride 0.9 % 100 mL IVPB  Status:  Discontinued        2 g 200 mL/hr over 30 Minutes Intravenous Every 8 hours 03/01/22 0734 03/02/22 1135   03/01/22 0800  vancomycin (VANCOCIN) IVPB 1000 mg/200 mL premix        1,000 mg 200 mL/hr over 60 Minutes Intravenous Every 12 hours 03/01/22 0740 03/03/22 2106   02/28/22 1515  vancomycin (VANCOREADY) IVPB 1250 mg/250 mL  Status:  Discontinued        1,250 mg 166.7 mL/hr over 90 Minutes Intravenous Every 24 hours 02/28/22 1421 03/01/22 0740   02/28/22 1400  vancomycin (VANCOCIN) IVPB 1000 mg/200 mL premix  Status:  Discontinued        1,000 mg 200 mL/hr over 60 Minutes Intravenous Every 24 hours 02/27/22 1245 02/28/22 0809   02/28/22 1000  sulfamethoxazole-trimethoprim (BACTRIM DS) 800-160 MG per tablet 1 tablet        1 tablet Oral Daily 02/28/22 0819     02/27/22 2200  vancomycin (VANCOCIN) IVPB 1000 mg/200 mL premix  Status:  Discontinued        1,000 mg 200 mL/hr over 60 Minutes Intravenous Every 24 hours 02/27/22 1003 02/27/22 1245   02/27/22 1330  vancomycin (VANCOREADY) IVPB 2000 mg/400 mL        2,000 mg 200 mL/hr over 120 Minutes Intravenous  Once 02/27/22 1245 02/27/22 1458   02/27/22 1100  vancomycin (VANCOREADY) IVPB 2000 mg/400 mL  Status:  Discontinued        2,000 mg 200 mL/hr over 120 Minutes Intravenous  Once 02/27/22 1000 02/27/22 1245   02/27/22 1030  ceFEPIme (MAXIPIME) 2 g in sodium chloride 0.9 % 100 mL IVPB  Status:  Discontinued        2 g 200 mL/hr over 30 Minutes Intravenous Every 12  hours 02/27/22 0958 03/01/22 0734   02/27/22 1015  ceFEPIme (MAXIPIME) 2 g in sodium chloride 0.9 % 100 mL IVPB  Status:  Discontinued        2 g 200 mL/hr over 30 Minutes Intravenous  Once 02/27/22 0925 02/27/22 0958   02/27/22 1015  metroNIDAZOLE (FLAGYL) IVPB 500 mg  Status:  Discontinued        500 mg 100 mL/hr over 60 Minutes Intravenous Every 12 hours 02/27/22 0925 03/02/22 1135   02/27/22 1015  vancomycin (VANCOCIN) IVPB 1000 mg/200 mL premix  Status:  Discontinued        1,000 mg 200 mL/hr over 60 Minutes Intravenous  Once 02/27/22 0925 02/27/22 0956   02/26/22 1435  ceFAZolin (ANCEF) 2-4 GM/100ML-% IVPB       Note to Pharmacy: Cameron Sprang M: cabinet override      02/26/22 1435 02/27/22 0244   02/26/22 1433  ceFAZolin (ANCEF) 3-0.9 GM/100ML-% IVPB       Note to Pharmacy: Valda Lamb J: cabinet override  02/26/22 1433 02/27/22 0244       Subjective:  Patient seen and examined at bedside this morning.  Hemodynamically stable but he still remains on mild sinus tachycardia.  Still looks volume overloaded.  Needs IV diuresis.  Objective: Vitals:   03/05/22 2013 03/05/22 2327 03/06/22 0432 03/06/22 0744  BP: 124/66 (!) 93/55 107/64 121/69  Pulse: (!) 104 92 82 99  Resp: 18 20 20 17   Temp: 98.8 F (37.1 C) 98.6 F (37 C) 98.7 F (37.1 C) 98.6 F (37 C)  TempSrc: Oral Oral Axillary Oral  SpO2: 94% 91% 91% 96%  Weight:      Height:        Intake/Output Summary (Last 24 hours) at 03/06/2022 1123 Last data filed at 03/06/2022 1021 Gross per 24 hour  Intake 596 ml  Output 3401 ml  Net -2805 ml   Filed Weights   02/26/22 1012 02/26/22 2019 03/03/22 0312  Weight: 131.5 kg 131 kg 135.3 kg    Examination:  General exam: Overall comfortable, not in distress,obese HEENT: PERRL Respiratory system:  no wheezes or crackles  Cardiovascular system: Sinus tachycardia Gastrointestinal system: Abdomen is nondistended, soft and nontender. Central nervous system: Alert  and oriented Extremities: Bilateral lower extremity pitting edema more on the left, no clubbing ,no cyanosis, staples on the left lower extremity Skin: No rashes, no ulcers,no icterus     Data Reviewed: I have personally reviewed following labs and imaging studies  CBC: Recent Labs  Lab 03/01/22 0224 03/02/22 0130 03/03/22 0104 03/04/22 0232 03/05/22 0118  WBC 13.9* 13.9* 15.4* 15.1* 16.2*  HGB 9.2* 8.9* 9.0* 8.7* 8.6*  HCT 29.9* 27.8* 28.3* 27.1* 27.5*  MCV 94.3 93.3 92.8 93.4 93.2  PLT 134* 130* 170 175 194   Basic Metabolic Panel: Recent Labs  Lab 02/28/22 0011 03/01/22 0224 03/02/22 0130 03/03/22 0104 03/04/22 0232 03/05/22 0118 03/06/22 0130  NA 135   < > 134* 138 136 135 136  K 4.4   < > 4.5 4.6 4.2 4.0 4.2  CL 102   < > 102 103 103 96* 97*  CO2 20*   < > 23 22 24 27 28   GLUCOSE 164*   < > 205* 148* 144* 177* 149*  BUN 31*   < > 20 22* 19 22* 23*  CREATININE 2.32*   < > 1.11 1.11 1.11 1.14 1.11  CALCIUM 7.7*   < > 8.2* 8.6* 8.4* 8.4* 8.6*  MG 2.0  --  2.3 2.4  --   --   --   PHOS 6.3*  --   --   --   --   --   --    < > = values in this interval not displayed.     Recent Results (from the past 240 hour(s))  Culture, blood (x 2)     Status: None   Collection Time: 02/27/22  9:39 AM   Specimen: BLOOD  Result Value Ref Range Status   Specimen Description BLOOD RIGHT ANTECUBITAL  Final   Special Requests   Final    BOTTLES DRAWN AEROBIC AND ANAEROBIC Blood Culture adequate volume   Culture   Final    NO GROWTH 5 DAYS Performed at [facility], [address], [place], [address] [place]    Report Status 03/04/2022 FINAL  Final  Culture, blood (x 2)     Status: None   Collection Time: 02/27/22  9:53 AM   Specimen: BLOOD  Result Value Ref Range  Status   Specimen Description BLOOD RIGHT ANTECUBITAL  Final   Special Requests   Final    BOTTLES DRAWN AEROBIC AND ANAEROBIC Blood Culture adequate volume   Culture   Final    NO GROWTH 5 DAYS Performed  at [facility], [address] [place], [place] [address]    Report Status 03/04/2022 FINAL  Final  MRSA Next Gen by PCR, Nasal     Status: None   Collection Time: 02/27/22 12:04 PM   Specimen: Nasal Mucosa; Nasal Swab  Result Value Ref Range Status   MRSA by PCR Next Gen NOT DETECTED NOT DETECTED Final    Comment: (NOTE) The GeneXpert MRSA Assay (FDA approved for NASAL specimens only), is one component of a comprehensive MRSA colonization surveillance program. It is not intended to diagnose MRSA infection nor to guide or monitor treatment for MRSA infections. Test performance is not FDA approved in patients less than 46 years old. Performed at [facility], [address] [place], [place] [address]   Urine Culture     Status: Abnormal   Collection Time: 02/27/22 12:45 PM   Specimen: Urine, Catheterized  Result Value Ref Range Status   Specimen Description URINE, CATHETERIZED  Final   Special Requests   Final    NONE Performed at [facility], [address], [place], [place] [address]    Culture 20,000 COLONIES/mL ENTEROCOCCUS FAECALIS (A)  Final   Report Status 03/01/2022 FINAL  Final   Organism ID, Bacteria ENTEROCOCCUS FAECALIS (A)  Final      Susceptibility   Enterococcus faecalis - MIC*    AMPICILLIN <=2 SENSITIVE Sensitive     NITROFURANTOIN <=16 SENSITIVE Sensitive     VANCOMYCIN 1 SENSITIVE Sensitive     * 20,000 COLONIES/mL ENTEROCOCCUS FAECALIS     Radiology Studies: No results found.  Scheduled Meds:  apixaban  10 mg Oral BID   Followed by   Melene Muller ON 03/10/2022] apixaban  5 mg Oral BID   atorvastatin  80 mg Oral Daily   DULoxetine  60 mg Oral BID   folic acid  1 mg Oral Daily   furosemide  60 mg Intravenous Q12H   gabapentin  900 mg Oral TID   insulin aspart  0-15 Units Subcutaneous TID WC   insulin aspart  0-5 Units Subcutaneous QHS   insulin aspart  8 Units Subcutaneous TID WC   insulin glargine-yfgn  20 Units Subcutaneous BID    metoprolol tartrate  50 mg Oral BID   nortriptyline  10 mg Oral QHS   predniSONE  60 mg Oral Q breakfast   sodium chloride flush  3 mL Intravenous Q12H   sulfamethoxazole-trimethoprim  1 tablet Oral Daily   Continuous Infusions:  sodium chloride       LOS: 8 days   Burnadette Pop, MD [facility] P7/09/2021, 11:23 AM

## 2022-03-07 LAB — GLUCOSE, CAPILLARY
Glucose-Capillary: 141 mg/dL — ABNORMAL HIGH (ref 70–99)
Glucose-Capillary: 131 mg/dL — ABNORMAL HIGH (ref 70–99)
Glucose-Capillary: 362 mg/dL — ABNORMAL HIGH (ref 70–99)
Glucose-Capillary: 327 mg/dL — ABNORMAL HIGH (ref 70–99)

## 2022-03-07 LAB — BASIC METABOLIC PANEL
Anion gap: 9 (ref 5–15)
BUN: 21 mg/dL — ABNORMAL HIGH (ref 6–20)
CO2: 28 mmol/L (ref 22–32)
Calcium: 8.4 mg/dL — ABNORMAL LOW (ref 8.9–10.3)
Chloride: 98 mmol/L (ref 98–111)
Creatinine, Ser: 1.22 mg/dL (ref 0.61–1.24)
GFR, Estimated: >60 mL/min (ref >60)
Glucose, Bld: 124 mg/dL — ABNORMAL HIGH (ref 70–99)
Potassium: 4.2 mmol/L (ref 3.5–5.1)
Sodium: 135 mmol/L (ref 135–145)

## 2022-03-07 MED ORDER — FUROSEMIDE 10 MG/ML IJ SOLN
80.0000 mg | Freq: Two times a day (BID) | INTRAMUSCULAR | Status: DC
Start: 2022-03-07 — End: 2022-03-08
  Administered 2022-03-07 (×2): 80 mg via INTRAVENOUS
  Filled 2022-03-07 (×2): qty 8

## 2022-03-07 MED ORDER — FUROSEMIDE 10 MG/ML IJ SOLN: 60.0000 mg | Freq: Two times a day (BID) | INTRAMUSCULAR | Status: DC

## 2022-03-07 NOTE — Progress Notes (Signed)
Patient stated he would place himself on CPAP when ready. RT instructed pt to have RT called if assistance is needed. RT will monitor as needed.

## 2022-03-07 NOTE — Progress Notes (Signed)
PROGRESS NOTE  James Hartman  ENI:[id number] DOB: 1976-07-22 DOA: 02/26/2022 PCP: [facility]   Brief Narrative: Patient is a 46 year old male with history of hypertension, diabetes type 2, morbid obesity, neurosarcoidosis versus granulomatous lymphadenitis who initially presented at [facility] hospital with complaint of bilateral lower extremity pain, swelling.  He was found to have left leg arterial clot with concern for limb ischemia and was transferred to [facility].  Status post thrombectomy/fasciotomy of left leg.  He was on heparin drip.  Hospital course remarkable for postoperative hypotension, fever, had to be transferred to ICU on pressors.  Also started on pressors, antibiotics.  Currently hemodynamically stable but has persistent sinus tachycardia.  Found to have severe volume overload, started on IV Lasix.  Plan for discharge to home after improvement in the volume status.  Assessment & Plan:  Principal Problem:   Acute lower limb ischemia with multifocal high grade stenosis or occlusion indicative of showering emboli  Active Problems:   Septic shock (HCC)   AKI (acute kidney injury) (HCC)   Acute pulmonary embolism and right LE DVT    Leukocytosis   Type 2 diabetes mellitus (HCC)   Neurosarcoidosis   Essential hypertension   Moderate persistent asthma   Normocytic anemia   Mixed hyperlipidemia   Obstructive sleep apnea   Granulomatous lymphadenitis   DVT (deep venous thrombosis) (HCC)  Acute lower extremity ischemia with multifocal high-grade stenosis/occlusion indicative of scarring emboli on the left: Vascular surgery was consulted.  Status post thrombectomy, fasciotomy.  Vascular surgery cleared for discharge.  He was on heparin drip, being transitioned to Eliquis.  He will follow-up with vascular surgery in 2 to 3 weeks for suture removal/follow-up  Volume overload/Diastolic congestive heart failure: patient got tons of fluid when he was in ICU.   Elevated BNP.  Looks grossly volume overloaded with bilateral lower extremity edema, upper extremity edema.    Monitor BMP Echocardiogram done on 5/23 showed EF of more than 75%, grade 1 diastolic dysfunction. Lasix increased to 80 mg twice a day, volume status improving, he is having very good diuresis.  We can apply TED hose  AKI: Creatinine peaked at 2.8 likely from ATN/hypotension.  Currently stable.  Monitor  Septic shock/UTI: Required pressors while in ICU.  Currently off pressors, blood pressure stable.  Afebrile.  Still has leukocytosis, this is most likely secondary to steroids.  Urine culture showed Enterococcus, treated with vancomycin.  Acute PE/right DVT: Incidental finding on CT angio, associated with right lower extremity DVT.  On heparin drip, now on Eliquis  Diabetes type 2/hyperglycemia: Hemoglobin A1c of 10.4 diarrhea coronary was following.  Takes glipizide, metformin at home.  Currently on insulin and sliding scale.  He needs insulin on discharge.  Planning to discharge with Basaglar 20 units BID, and Novolog 10 units TID.  Neurosarcoidosis: [facility].  On prednisone 60 mg daily.  On outpatient PCP prophylaxis with Bactrim, follows with Dr. Algis Liming  Hypertension: Hospital course remarkable for hypotension.  On Diovan and hydrochlorothiazide at home, currently on hold  Sinus tachycardia: Unclear etiology,likely due to volume overload.  TSH normal.  Recent echo showed EF of more than 75%, grade 1 diastolic dysfunction.  We will continue to monitor on telemetry.  Added low dose metoprolol.  Now improved  Moderate persistent asthma: Continue bronchodilators.  Currently on room air  Normocytic anemia: Currently hemoglobin stable  Hyperlipidemia: On statin  History of OSA: On CPAP at night  Morbid obesity: BMI 45.3  Debility/deconditioning: Patient seen by PT and OT and recommended home health on discharge.  As per the report, he fell yesterday while ambulating.           DVT prophylaxis:Place TED hose Start: 03/07/22 1013 apixaban (ELIQUIS) tablet 10 mg  apixaban (ELIQUIS) tablet 5 mg     Code Status: Full Code  Family Communication: None at the bedside  Patient status: Inpatient  Patient is from : Home  Anticipated discharge to: Home  Estimated DC date: Tomorrow if improvement  in the volume status   Consultants: Vascular surgery  Procedures: As above  Antimicrobials:  Anti-infectives (From admission, onward)    Start     Dose/Rate Route Frequency Ordered Stop   03/01/22 0800  ceFEPIme (MAXIPIME) 2 g in sodium chloride 0.9 % 100 mL IVPB  Status:  Discontinued        2 g 200 mL/hr over 30 Minutes Intravenous Every 8 hours 03/01/22 0734 03/02/22 1135   03/01/22 0800  vancomycin (VANCOCIN) IVPB 1000 mg/200 mL premix        1,000 mg 200 mL/hr over 60 Minutes Intravenous Every 12 hours 03/01/22 0740 03/03/22 2106   02/28/22 1515  vancomycin (VANCOREADY) IVPB 1250 mg/250 mL  Status:  Discontinued        1,250 mg 166.7 mL/hr over 90 Minutes Intravenous Every 24 hours 02/28/22 1421 03/01/22 0740   02/28/22 1400  vancomycin (VANCOCIN) IVPB 1000 mg/200 mL premix  Status:  Discontinued        1,000 mg 200 mL/hr over 60 Minutes Intravenous Every 24 hours 02/27/22 1245 02/28/22 0809   02/28/22 1000  sulfamethoxazole-trimethoprim (BACTRIM DS) 800-160 MG per tablet 1 tablet        1 tablet Oral Daily 02/28/22 0819     02/27/22 2200  vancomycin (VANCOCIN) IVPB 1000 mg/200 mL premix  Status:  Discontinued        1,000 mg 200 mL/hr over 60 Minutes Intravenous Every 24 hours 02/27/22 1003 02/27/22 1245   02/27/22 1330  vancomycin (VANCOREADY) IVPB 2000 mg/400 mL        2,000 mg 200 mL/hr over 120 Minutes Intravenous  Once 02/27/22 1245 02/27/22 1458   02/27/22 1100  vancomycin (VANCOREADY) IVPB 2000 mg/400 mL  Status:  Discontinued        2,000 mg 200 mL/hr over 120 Minutes Intravenous  Once 02/27/22 1000 02/27/22 1245   02/27/22 1030   ceFEPIme (MAXIPIME) 2 g in sodium chloride 0.9 % 100 mL IVPB  Status:  Discontinued        2 g 200 mL/hr over 30 Minutes Intravenous Every 12 hours 02/27/22 0958 03/01/22 0734   02/27/22 1015  ceFEPIme (MAXIPIME) 2 g in sodium chloride 0.9 % 100 mL IVPB  Status:  Discontinued        2 g 200 mL/hr over 30 Minutes Intravenous  Once 02/27/22 0925 02/27/22 0958   02/27/22 1015  metroNIDAZOLE (FLAGYL) IVPB 500 mg  Status:  Discontinued        500 mg 100 mL/hr over 60 Minutes Intravenous Every 12 hours 02/27/22 0925 03/02/22 1135   02/27/22 1015  vancomycin (VANCOCIN) IVPB 1000 mg/200 mL premix  Status:  Discontinued        1,000 mg 200 mL/hr over 60 Minutes Intravenous  Once 02/27/22 0925 02/27/22 0956   02/26/22 1435  ceFAZolin (ANCEF) 2-4 GM/100ML-% IVPB       Note to Pharmacy: Cameron Sprang M: cabinet override      02/26/22 1435 02/27/22  0244   02/26/22 1433  ceFAZolin (ANCEF) 3-0.9 GM/100ML-% IVPB       Note to Pharmacy: Lorre Munroe J: cabinet override      02/26/22 1433 02/27/22 0244       Subjective:  Patient seen and examined at bedside this morning.  Hemodynamically stable.  Very eager to go home.  He feels better.  Sinus tachycardia has improved.  Lower extremity edema has improved since yesterday.  We discussed about staying at least a day more to continue IV diuresis  Objective: Vitals:   03/07/22 0009 03/07/22 0403 03/07/22 0406 03/07/22 0753  BP: 121/71 110/71  (!) 121/54  Pulse: 81 91  95  Resp: 16 16  17   Temp: 99 F (37.2 C) (!) 97.5 F (36.4 C)  98.8 F (37.1 C)  TempSrc: Oral Oral  Oral  SpO2: 94% 98%  98%  Weight:   129.4 kg   Height:        Intake/Output Summary (Last 24 hours) at 03/07/2022 1108 Last data filed at 03/07/2022 0416 Gross per 24 hour  Intake 240 ml  Output 1650 ml  Net -1410 ml   Filed Weights   02/26/22 2019 03/03/22 0312 03/07/22 0406  Weight: 131 kg 135.3 kg 129.4 kg    Examination:  General exam: Overall comfortable, not in  distress,obese HEENT: PERRL Respiratory system:  no wheezes or crackles  Cardiovascular system: S1 & S2 heard, RRR.  Gastrointestinal system: Abdomen is nondistended, soft and nontender. Central nervous system: Alert and oriented Extremities: Bilateral lower extremity edema, no clubbing ,no cyanosis, staples on the left lower extremity Skin: No rashes, no ulcers,no icterus     Data Reviewed: I have personally reviewed following labs and imaging studies  CBC: Recent Labs  Lab 03/01/22 0224 03/02/22 0130 03/03/22 0104 03/04/22 0232 03/05/22 0118  WBC 13.9* 13.9* 15.4* 15.1* 16.2*  HGB 9.2* 8.9* 9.0* 8.7* 8.6*  HCT 29.9* 27.8* 28.3* 27.1* 27.5*  MCV 94.3 93.3 92.8 93.4 93.2  PLT 134* 130* 170 175 194   Basic Metabolic Panel: Recent Labs  Lab 03/02/22 0130 03/03/22 0104 03/04/22 0232 03/05/22 0118 03/06/22 0130 03/07/22 0216  NA 134* 138 136 135 136 135  K 4.5 4.6 4.2 4.0 4.2 4.2  CL 102 103 103 96* 97* 98  CO2 23 22 24 27 28 28   GLUCOSE 205* 148* 144* 177* 149* 124*  BUN 20 22* 19 22* 23* 21*  CREATININE 1.11 1.11 1.11 1.14 1.11 1.22  CALCIUM 8.2* 8.6* 8.4* 8.4* 8.6* 8.4*  MG 2.3 2.4  --   --   --   --      Recent Results (from the past 240 hour(s))  Culture, blood (x 2)     Status: None   Collection Time: 02/27/22  9:39 AM   Specimen: BLOOD  Result Value Ref Range Status   Specimen Description BLOOD RIGHT ANTECUBITAL  Final   Special Requests   Final    BOTTLES DRAWN AEROBIC AND ANAEROBIC Blood Culture adequate volume   Culture   Final    NO GROWTH 5 DAYS Performed at [facility], [address] [place], [address] [place]    Report Status 03/04/2022 FINAL  Final  Culture, blood (x 2)     Status: None   Collection Time: 02/27/22  9:53 AM   Specimen: BLOOD  Result Value Ref Range Status   Specimen Description BLOOD RIGHT ANTECUBITAL  Final   Special Requests   Final  BOTTLES DRAWN AEROBIC AND ANAEROBIC Blood Culture adequate volume   Culture    Final    NO GROWTH 5 DAYS Performed at [facility], [place] [address], [place], [place] [address]    Report Status 03/04/2022 FINAL  Final  MRSA Next Gen by PCR, Nasal     Status: None   Collection Time: 02/27/22 12:04 PM   Specimen: Nasal Mucosa; Nasal Swab  Result Value Ref Range Status   MRSA by PCR Next Gen NOT DETECTED NOT DETECTED Final    Comment: (NOTE) The GeneXpert MRSA Assay (FDA approved for NASAL specimens only), is one component of a comprehensive MRSA colonization surveillance program. It is not intended to diagnose MRSA infection nor to guide or monitor treatment for MRSA infections. Test performance is not FDA approved in patients less than 61 years old. Performed at [facility], [place] [address] [place],  [address]   Urine Culture     Status: Abnormal   Collection Time: 02/27/22 12:45 PM   Specimen: Urine, Catheterized  Result Value Ref Range Status   Specimen Description URINE, CATHETERIZED  Final   Special Requests   Final    NONE Performed at [facility], [place] [address] [place], [place] [address]    Culture 20,000 COLONIES/mL ENTEROCOCCUS FAECALIS (A)  Final   Report Status 03/01/2022 FINAL  Final   Organism ID, Bacteria ENTEROCOCCUS FAECALIS (A)  Final      Susceptibility   Enterococcus faecalis - MIC*    AMPICILLIN <=2 SENSITIVE Sensitive     NITROFURANTOIN <=16 SENSITIVE Sensitive     VANCOMYCIN 1 SENSITIVE Sensitive     * 20,000 COLONIES/mL ENTEROCOCCUS FAECALIS     Radiology Studies: No results found.  Scheduled Meds:  apixaban  10 mg Oral BID   Followed by   Derrill Memo ON 03/10/2022] apixaban  5 mg Oral BID   atorvastatin  80 mg Oral Daily   DULoxetine  60 mg Oral BID   folic acid  1 mg Oral Daily   furosemide  80 mg Intravenous BID   gabapentin  900 mg Oral TID   insulin aspart  0-15 Units Subcutaneous TID WC   insulin aspart  0-5 Units Subcutaneous QHS   insulin aspart  8 Units Subcutaneous TID WC   insulin  glargine-yfgn  20 Units Subcutaneous BID   metoprolol tartrate  50 mg Oral BID   nortriptyline  10 mg Oral QHS   predniSONE  60 mg Oral Q breakfast   sodium chloride flush  3 mL Intravenous Q12H   sulfamethoxazole-trimethoprim  1 tablet Oral Daily   Continuous Infusions:  sodium chloride       LOS: 9 days   Shelly Coss, MD [facility] P7/10/2021, 11:08 AM

## 2022-03-08 ENCOUNTER — Other Ambulatory Visit (HOSPITAL_COMMUNITY): Payer: Self-pay

## 2022-03-08 LAB — BASIC METABOLIC PANEL
Anion gap: 10 (ref 5–15)
BUN: 21 mg/dL — ABNORMAL HIGH (ref 6–20)
CO2: 28 mmol/L (ref 22–32)
Calcium: 8.4 mg/dL — ABNORMAL LOW (ref 8.9–10.3)
Chloride: 99 mmol/L (ref 98–111)
Creatinine, Ser: 1.19 mg/dL (ref 0.61–1.24)
GFR, Estimated: >60 mL/min (ref >60)
Glucose, Bld: 129 mg/dL — ABNORMAL HIGH (ref 70–99)
Potassium: 4.2 mmol/L (ref 3.5–5.1)
Sodium: 137 mmol/L (ref 135–145)

## 2022-03-08 LAB — CBC
HCT: 27.1 % — ABNORMAL LOW (ref 39.0–52.0)
Hemoglobin: 8.2 g/dL — ABNORMAL LOW (ref 13.0–17.0)
MCH: 28.8 pg (ref 26.0–34.0)
MCHC: 30.3 g/dL (ref 30.0–36.0)
MCV: 95.1 fL (ref 80.0–100.0)
Platelets: 296 10*3/uL (ref 150–400)
RBC: 2.85 MIL/uL — ABNORMAL LOW (ref 4.22–5.81)
RDW: 13.9 % (ref 11.5–15.5)
WBC: 15.8 10*3/uL — ABNORMAL HIGH (ref 4.0–10.5)
nRBC: 0.3 % — ABNORMAL HIGH (ref 0.0–0.2)

## 2022-03-08 LAB — GLUCOSE, CAPILLARY
Glucose-Capillary: 109 mg/dL — ABNORMAL HIGH (ref 70–99)
Glucose-Capillary: 169 mg/dL — ABNORMAL HIGH (ref 70–99)

## 2022-03-08 MED ORDER — FUROSEMIDE 20 MG PO TABS
20.0000 mg | ORAL_TABLET | Freq: Every day | ORAL | 1 refills | Status: AC
  Filled 2022-03-08 – 2022-09-02 (×3): qty 30, 23d supply, fill #0

## 2022-03-08 MED ORDER — METOPROLOL TARTRATE 50 MG PO TABS
50.0000 mg | ORAL_TABLET | Freq: Two times a day (BID) | ORAL | 1 refills | Status: AC
  Filled 2022-03-08 – 2022-09-02 (×3): qty 60, 30d supply, fill #0

## 2022-03-08 NOTE — Discharge Summary (Signed)
Physician Discharge Summary  James Hartman WUJ:[id number] DOB: 03-27-1976 DOA: 02/26/2022  PCP: 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  He was found to have left leg arterial clot with concern for limb ischemia and was transferred to [facility].  Status post thrombectomy/fasciotomy of left leg.  He was on heparin drip.  Hospital course remarkable for postoperative hypotension, fever, had to be transferred to ICU on pressors.  Also started on pressors, antibiotics.  Currently hemodynamically stable ,hospital course remarkable  sinus tachycardia,volume overload now improved with IV diuresis.  Medically stable for discharge home today.  Following problems were addressed during his hospitalization:  Acute lower extremity ischemia with multifocal high-grade stenosis/occlusion indicative of scarring emboli on the left: Vascular surgery was consulted.  Status post thrombectomy, fasciotomy.  Vascular surgery cleared for discharge.  He was on heparin drip, being transitioned to Eliquis.  He will follow-up with vascular surgery in 2 to 3 weeks for suture removal/follow-up   Volume overload/Diastolic congestive heart failure: patient got tons of fluid when he was in ICU.  Elevated BNP.  Looked grossly volume overloaded with bilateral lower extremity edema, upper extremity edema.   Echocardiogram done on 5/23 showed EF of more than 75%, grade 1 diastolic dysfunction. Treated with iv diuretics.Continue oral lasix on dc   AKI: Creatinine peaked at 2.8 likely from  ATN/hypotension.  Currently stable.    Septic shock/UTI: Required pressors while in ICU.  Currently off pressors, blood pressure stable.  Afebrile.  Still has leukocytosis, this is most likely secondary to steroids.  Urine culture showed Enterococcus, treated with vancomycin.   Acute PE/right DVT: Incidental finding on CT angio, associated with right lower extremity DVT.  On heparin drip, now on Eliquis   Diabetes type 2/hyperglycemia: Hemoglobin A1c of 10.4 diarrhea coronary was following.  Takes glipizide, metformin at home.  Currently on insulin and sliding scale.  He needs insulin on discharge.  Planning to discharge with Basaglar 20 units BID, and Novolog 10 units TID.   Neurosarcoidosis: Follows at [facility].  On prednisone 60 mg daily.  On outpatient PCP prophylaxis with Bactrim, follows with Dr. Algis Liming   Hypertension: Hospital course remarkable for hypotension.  On Diovan and hydrochlorothiazide at home, currently on hold   Sinus tachycardia: Unclear etiology,likely due to volume overload.  TSH normal.  Recent echo showed EF of more than 75%, grade 1 diastolic dysfunction.  Added low dose metoprolol.  Now improved   Moderate persistent asthma: Continue bronchodilators.  Currently on room air   Normocytic anemia: Currently hemoglobin stable   Hyperlipidemia: On statin   History of OSA: On CPAP at night   Morbid obesity: BMI 45.3   Debility/deconditioning: Patient seen by PT and OT and recommended home health on discharge.        Discharge Diagnoses:  Principal Problem:   Acute lower limb ischemia with multifocal high grade stenosis or occlusion indicative of showering emboli  Active Problems:   Septic shock (HCC)   AKI (acute kidney injury) (HCC)   Acute pulmonary embolism and right LE DVT    Leukocytosis   Type 2 diabetes mellitus (HCC)   Neurosarcoidosis  Essential hypertension   Moderate persistent asthma   Normocytic anemia   Mixed hyperlipidemia   Obstructive sleep  apnea   Granulomatous lymphadenitis   DVT (deep venous thrombosis) [facility])    Discharge Instructions  Discharge Instructions     Ambulatory referral to Nutrition and Diabetic Education   Complete by: As directed    Diet Carb Modified   Complete by: As directed    Discharge instructions   Complete by: As directed    1)Please take prescribed medications as instructed 2)Follow up with your PCP in a week. 3)Follow up with vascular surgery in 2 to 3 weeks.  Name and number of the provider group has been attached   Increase activity slowly   Complete by: As directed    No wound care   Complete by: As directed       Allergies as of 03/08/2022   No Known Allergies      Medication List     STOP taking these medications    glipiZIDE 5 MG tablet Commonly known as: GLUCOTROL   hydrochlorothiazide 12.5 MG tablet Commonly known as: HYDRODIURIL   metFORMIN 500 MG tablet Commonly known as: GLUCOPHAGE   pravastatin 40 MG tablet Commonly known as: PRAVACHOL   valsartan 80 MG tablet Commonly known as: DIOVAN       TAKE these medications    albuterol 108 (90 Base) MCG/ACT inhaler Commonly known as: VENTOLIN HFA Inhale 1-2 puffs into the lungs every 6 (six) hours as needed for wheezing or shortness of breath.   atorvastatin 80 MG tablet Commonly known as: LIPITOR Take 1 tablet (80 mg total) by mouth daily.   Basaglar KwikPen 100 UNIT/ML Inject 20 Units into the skin 2 (two) times daily.   BD Pen Needle Nano U/F 32G X 4 MM Misc Generic drug: Insulin Pen Needle Use as directed with insulin pens.   DULoxetine 60 MG capsule Commonly known as: CYMBALTA Take 60 mg by mouth 2 (two) times daily.   Eliquis 5 MG Tabs tablet Generic drug: apixaban Take 2 tablets (10 mg total) by mouth 2 (two) times daily for 4 days, then take 1 tablet (5mg ) by mouth twice daily   apixaban 5 MG Tabs tablet Commonly known as: ELIQUIS Take 1 tablet (5 mg total) by mouth 2 (two) times  daily. Start taking on: March 10, 2022   Flovent HFA 220 MCG/ACT inhaler Generic drug: fluticasone Inhale 1-2 puffs into the lungs daily as needed for shortness of breath.   fluticasone 50 MCG/ACT nasal spray Commonly known as: FLONASE Place 1 spray into both nostrils daily as needed for allergies or rhinitis.   folic acid 1 MG tablet Commonly known as: FOLVITE Take 1 tablet (1 mg total) by mouth daily.   FreeStyle Libre 2 Sensor Misc 1 each by Does not apply route 3 (three) times daily.   furosemide 20 MG tablet Commonly known as: LASIX Take 1 tablet (20 mg total) by mouth daily. Take 2 pills daily for 7 days then continue taking one pill daily What changed:  when to take this reasons to take this additional instructions   gabapentin 300 MG capsule Commonly known as: NEURONTIN Take 3 capsules by mouth 3 (three) times daily.   metoprolol tartrate 50 MG tablet Commonly known as: LOPRESSOR Take 1 tablet (50 mg total) by mouth 2 (two) times daily.   nortriptyline 10 MG capsule Commonly known as: PAMELOR Take 10 mg by mouth at bedtime.   NovoLOG FlexPen 100 UNIT/ML FlexPen  Generic drug: insulin aspart Inject 10 Units into the skin 3 (three) times daily with meals.   polyethylene glycol 17 g packet Commonly known as: MIRALAX / GLYCOLAX Take 17 g by mouth daily.   predniSONE 20 MG tablet Commonly known as: DELTASONE Take 60 mg by mouth daily with breakfast.   sulfamethoxazole-trimethoprim 800-160 MG tablet Commonly known as: BACTRIM DS Take 1 tablet by mouth daily.   traMADol 50 MG tablet Commonly known as: ULTRAM Take 50 mg by mouth daily as needed for pain.        Follow-up Information     [facility] Follow up in 3 week(s).   Specialty: Vascular Surgery Why: Office will call to arrange your appt(s) (sent) Contact information: [place]  Follow up.   Why: ([facility])- HHPT/OT arranged- they will contact you to schedule home visits               No Known Allergies  Consultations: Vascular surgery   Procedures/Studies: DG CHEST PORT 1 VIEW  Result Date: 03/03/2022 CLINICAL DATA:  Tachypnea EXAM: PORTABLE CHEST 1 VIEW COMPARISON:  02/27/2022 FINDINGS: The heart size and mediastinal contours are stable. No focal airspace consolidation, pleural effusion, or pneumothorax. The visualized skeletal structures are unremarkable. IMPRESSION: No active disease. Electronically Signed   By: Davina Poke D.O.   On: 03/03/2022 13:10   DG CHEST PORT 1 VIEW  Result Date: 02/27/2022 CLINICAL DATA:  Acute hypercapnic respiratory failure. EXAM: PORTABLE CHEST 1 VIEW COMPARISON:  Chest radiograph 11/17/2021 FINDINGS: Upper normal heart size. Stable mediastinal contours. Hazy opacity at the lung bases may represent trace effusions. No focal airspace disease. No pulmonary edema. No pneumothorax. IMPRESSION: Possible trace pleural effusions. Electronically Signed   By: Keith Rake M.D.   On: 02/27/2022 12:19   DG Abd Portable 1V  Result Date: 02/27/2022 CLINICAL DATA:  Abdominal distention. EXAM: PORTABLE ABDOMEN - 1 VIEW COMPARISON:  None Available. FINDINGS: The bowel gas pattern is normal. Small amount of contrast is noted within the urinary bladder, which is attributable to recent CTA. No radio-opaque calculi or other significant radiographic abnormality are seen. IMPRESSION: Unremarkable bowel gas pattern.  No acute findings. Electronically Signed   By: Marlaine Hind M.D.   On: 02/27/2022 11:10   DG C-Arm 1-60 Min  Result Date: 02/27/2022 CLINICAL DATA:  Intraoperative arteriogram. EXAM: DG C-ARM 1-60 MIN COMPARISON:  CTA abdomen pelvis runoff, 02/26/2022. FLUOROSCOPY: Exposure Index (as provided by the fluoroscopic device): 14.34 mGy Kerma FINDINGS: Multiple, limited frontal and oblique planar images of the LEFT lower extremity  obtained C-arm. Images demonstrating instrumentation at the LEFT groin and on subtracted LEFT lower extremity runoff. Near-complete occlusion of the profunda femoral artery diseased by patent distal superficial femoral artery and occlusion of the popliteal artery below the knee runoff into the LEFT foot via small collateral vessels. IMPRESSION: Fluoroscopic imaging for intraoperative LEFT lower extremity angiogram and runoff. Near-complete occlusion of the LEFT profunda femoris and infrapopliteal arteries with runoff into the foot via small collaterals. For complete description of intra procedural findings, please see performing service dictation. Electronically Signed   By: Michaelle Birks M.D.   On: 02/27/2022 09:26   CT Angio Aortobifemoral W and/or Wo Contrast  Result Date: 02/26/2022 CLINICAL DATA:  Bilateral leg and foot pain and numbness for 3 days. Patient states he is normally ambulatory with cane but has been unable to  ambulate due to pain in bilateral legs and feet. Pt having involuntary twitching of legs/unable to control, included second lower extremity images incase there was movement. EXAM: CT ANGIOGRAPHY OF ABDOMINAL AORTA WITH ILIOFEMORAL RUNOFF TECHNIQUE: Multidetector CT imaging of the abdomen, pelvis and lower extremities was performed using the standard protocol during bolus administration of intravenous contrast. Multiplanar CT image reconstructions and MIPs were obtained to evaluate the vascular anatomy. RADIATION DOSE REDUCTION: This exam was performed according to the departmental dose-optimization program which includes automated exposure control, adjustment of the mA and/or kV according to patient size and/or use of iterative reconstruction technique. CONTRAST:  125mL OMNIPAQUE IOHEXOL 350 MG/ML SOLN COMPARISON:  None available FINDINGS: VASCULAR Aorta: Minimal noncalcified and calcified plaque of the infrarenal aorta without aneurysm or flow-limiting stenosis. Celiac: Patent without  evidence of aneurysm, dissection, vasculitis or significant stenosis. SMA: Patent without evidence of aneurysm, dissection, vasculitis or significant stenosis. Renals: Both renal arteries are patent without evidence of aneurysm, dissection, vasculitis, fibromuscular dysplasia or significant stenosis. IMA: Patent. RIGHT Lower Extremity Inflow: Approximately 75% stenosis on the right common iliac artery due to eccentric intraluminal filling defect which is suspicious for embolus. No significant narrowing of the internal or external iliac arteries. Outflow: No significant stenosis of the Profunda femoris or common femoral arteries. 50-25% stenosis of the distal superficial femoral artery due to atheromatous plaque. No significant narrowing of the popliteal artery. Runoff: Patent three vessel runoff to the ankle. LEFT Lower Extremity Inflow: Less than 25% stenosis of the proximal left common iliac artery due to mixed calcified and noncalcified plaque. Severe stenosis at the origin of the origin of the left internal iliac artery which is otherwise patent. There is low-density intraluminal filling defect within the proximal left external iliac artery with greater than 75% stenosis which is highly suspicious for embolus. Left external iliac artery otherwise patent. Outflow: The common femoral artery is patent. There is abrupt occlusion of the distal left Profunda femoris artery likely due to embolus. Less than 25% stenosis of the distal left SFA due to noncalcified plaque. There is occlusion of the popliteal artery suspicious for embolism. Runoff: Tibioperoneal trunk is occluded likely due to embolus. Discontinuous opacification of the posterior tibial artery is seen. Anterior tibial and peroneal arteries are occluded. Veins: No obvious venous abnormality within the limitations of this arterial phase study. Review of the MIP images confirms the above findings. NON-VASCULAR Lower chest: Incidental note of the pulmonary  emboli in the right lower lobe segmental branches best seen on image 8 of series 4. 2 mm subpleural nodule in the posteromedial left lower lobe is most likely an intrapulmonary lymph node it does not require further follow-up. Hepatobiliary: No focal liver abnormality is seen. No gallstones, gallbladder wall thickening, or biliary dilatation. Pancreas: Unremarkable. No pancreatic ductal dilatation or surrounding inflammatory changes. Spleen: Normal in size without focal abnormality. Adrenals/Urinary Tract: Adrenal glands are normal. 4.4 cm simple cyst present in the left kidney. Kidneys, adrenal glands, and bladder otherwise unremarkable. Stomach/Bowel: Stomach is within normal limits. Appendix appears normal. No evidence of bowel wall thickening, distention, or inflammatory changes. Lymphatic: Mildly enlarged left inguinal lymph node measuring 1.2 cm in short axis. Mildly enlarged retroperitoneal lymph nodes are seen with the largest measuring 1.2 cm in short axis. Reproductive: Prostate is unremarkable.  Large left hydrocele. Other: No abdominal wall hernia or abnormality. No abdominopelvic ascites. Musculoskeletal: No acute or significant osseous findings. IMPRESSION: VASCULAR 1. Multifocal high-grade stenoses or occlusion indicative of showering emboli. 2. 75%  stenosis of the right common iliac artery. 3. Greater than 75% stenosis of the left external iliac artery origin. 4. Occlusion of distal left profunda femoris artery. 5. Occluded left popliteal, anterior tibial, and peroneal arteries with discontinuous opacification of posterior tibial artery. NON-VASCULAR 1. Acute pulmonary emboli noted in the right lower lobe segmental branches. 2. Mildly enlarged retroperitoneal and left pelvic lymph nodes suspicious for metastatic disease versus lymphoma/leukemia. These results were called by telephone at the time of interpretation on 02/26/2022 at 2:00 pm to provider Leonard , who verbally acknowledged these  results. Electronically Signed   By: Miachel Roux M.D.   On: 02/26/2022 14:02   US Venous Img Lower Bilateral (DVT)  Result Date: 02/26/2022 CLINICAL DATA:  BILATERAL leg pain EXAM: BILATERAL LOWER EXTREMITY VENOUS DOPPLER ULTRASOUND TECHNIQUE: Gray-scale sonography with graded compression, as well as color Doppler and duplex ultrasound were performed to evaluate the lower extremity deep venous systems from the level of the common femoral vein and including the common femoral, femoral, profunda femoral, popliteal and calf veins including the posterior tibial, peroneal and gastrocnemius veins when visible. The superficial great saphenous vein was also interrogated. Spectral Doppler was utilized to evaluate flow at rest and with distal augmentation maneuvers in the common femoral, femoral and popliteal veins. COMPARISON:  None Available. FINDINGS: RIGHT LOWER EXTREMITY Common Femoral Vein: No evidence of thrombus. Normal compressibility, respiratory phasicity and response to augmentation. Saphenofemoral Junction: No evidence of thrombus. Normal compressibility and flow on color Doppler imaging. Profunda Femoral Vein: No evidence of thrombus. Normal compressibility and flow on color Doppler imaging. Femoral Vein: No evidence of thrombus. Normal compressibility, respiratory phasicity and response to augmentation. Popliteal Vein: No evidence of thrombus. Normal compressibility, respiratory phasicity and response to augmentation. Calf Veins: Posterior tibial veins appear patent. Anterior tibial veins not visualized. Peroneal veins are suboptimally seen though a segment of hypoechoic internal echogenicity is identified within a peroneal vein with impaired spontaneous venous flow on color Doppler imaging consistent with deep vein thrombosis. Superficial Great Saphenous Vein: No evidence of thrombus. Normal compressibility. Venous Reflux:  None. Other Findings:  None. LEFT LOWER EXTREMITY Common Femoral Vein: No evidence  of thrombus. Normal compressibility, respiratory phasicity and response to augmentation. Saphenofemoral Junction: No evidence of thrombus. Normal compressibility and flow on color Doppler imaging. Profunda Femoral Vein: No evidence of thrombus. Normal compressibility and flow on color Doppler imaging. Femoral Vein: No evidence of thrombus. Normal compressibility, respiratory phasicity and response to augmentation. Popliteal Vein: No evidence of thrombus. Normal compressibility, respiratory phasicity and response to augmentation. Calf Veins: No evidence of thrombus. Normal compressibility and flow on color Doppler imaging. Superficial Great Saphenous Vein: No evidence of thrombus. Normal compressibility. Venous Reflux:  None. Other Findings:  None. IMPRESSION: Positive exam for presence of deep venous thrombosis within RIGHT peroneal vein. No evidence of deep venous thrombosis within the LEFT lower extremity or at/above the RIGHT knee. Electronically Signed   By: Lavonia Dana M.D.   On: 02/26/2022 11:24      Subjective:  Bedside elevated patient seen and examined at the bedside this morning.  Hemodynamically stable for discharge today. Discharge Exam: Vitals:   03/08/22 0339 03/08/22 0817  BP: 124/69 (!) 150/90  Pulse: 93 76  Resp: 20 20  Temp: 98.3 F (36.8 C) 97.9 F (36.6 C)  SpO2: 96% 100%   Vitals:   03/07/22 2107 03/08/22 0004 03/08/22 0339 03/08/22 0817  BP:  140/73 124/69 (!) 150/90  Pulse: 89 94 93 76  Resp: 16 20 20 20   Temp:  97.7 F (36.5 C) 98.3 F (36.8 C) 97.9 F (36.6 C)  TempSrc:  Oral Axillary Oral  SpO2: 95% 95% 96% 100%  Weight:   129.4 kg   Height:        General: Pt is alert, awake, not in acute distress, obese Cardiovascular: RRR, S1/S2 +, no rubs, no gallops Respiratory: CTA bilaterally, no wheezing, no rhonchi Abdominal: Soft, NT, ND, bowel sounds + Extremities: trace lower extremity edema, no cyanosis    The results of significant diagnostics from this  hospitalization (including imaging, microbiology, ancillary and laboratory) are listed below for reference.     Microbiology: Recent Results (from the past 240 hour(s))  Culture, blood (x 2)     Status: None   Collection Time: 02/27/22  9:39 AM   Specimen: BLOOD  Result Value Ref Range Status   Specimen Description BLOOD RIGHT ANTECUBITAL  Final   Special Requests   Final    BOTTLES DRAWN AEROBIC AND ANAEROBIC Blood Culture adequate volume   Culture   Final    NO GROWTH 5 DAYS Performed at [facility], [address] [place], [place] [address]    Report Status 03/04/2022 FINAL  Final  Culture, blood (x 2)     Status: None   Collection Time: 02/27/22  9:53 AM   Specimen: BLOOD  Result Value Ref Range Status   Specimen Description BLOOD RIGHT ANTECUBITAL  Final   Special Requests   Final    BOTTLES DRAWN AEROBIC AND ANAEROBIC Blood Culture adequate volume   Culture   Final    NO GROWTH 5 DAYS Performed at [facility], [place] [address], [place], [place] [address]    Report Status 03/04/2022 FINAL  Final  MRSA Next Gen by PCR, Nasal     Status: None   Collection Time: 02/27/22 12:04 PM   Specimen: Nasal Mucosa; Nasal Swab  Result Value Ref Range Status   MRSA by PCR Next Gen NOT DETECTED NOT DETECTED Final    Comment: (NOTE) The GeneXpert MRSA Assay (FDA approved for NASAL specimens only), is one component of a comprehensive MRSA colonization surveillance program. It is not intended to diagnose MRSA infection nor to guide or monitor treatment for MRSA infections. Test performance is not FDA approved in patients less than 34 years old. Performed at [facility], [place] [address] [place], [place] [address]   Urine Culture     Status: Abnormal   Collection Time: 02/27/22 12:45 PM   Specimen: Urine, Catheterized  Result Value Ref Range Status   Specimen Description URINE, CATHETERIZED  Final   Special Requests   Final    NONE Performed at [facility], [place] [address], [place], [place] [address]    Culture 20,000 COLONIES/mL ENTEROCOCCUS FAECALIS (A)  Final   Report Status 03/01/2022 FINAL  Final   Organism ID, Bacteria ENTEROCOCCUS FAECALIS (A)  Final      Susceptibility   Enterococcus faecalis - MIC*    AMPICILLIN <=2 SENSITIVE Sensitive     NITROFURANTOIN <=16 SENSITIVE Sensitive     VANCOMYCIN 1 SENSITIVE Sensitive     * 20,000 COLONIES/mL ENTEROCOCCUS FAECALIS     Labs: BNP (last 3 results) Recent Labs    02/26/22 1046 03/03/22 1250  BNP 24.0 [id number]*   Basic Metabolic Panel: Recent Labs  Lab 03/02/22 0130 03/03/22 0104 03/04/22 0232 03/05/22 0118 03/06/22 0130 03/07/22 0216 03/08/22 0343  NA  134* 138 136 135 136 135 137  K 4.5 4.6 4.2 4.0 4.2 4.2 4.2  CL 102 103 103 96* 97* 98 99  CO2 23 22 24 27 28 28 28   GLUCOSE 205* 148* 144* 177* 149* 124* 129*  BUN 20 22* 19 22* 23* 21* 21*  CREATININE 1.11 1.11 1.11 1.14 1.11 1.22 1.19  CALCIUM 8.2* 8.6* 8.4* 8.4* 8.6* 8.4* 8.4*  MG 2.3 2.4  --   --   --   --   --    Liver Function Tests: No results for input(s): "AST", "ALT", "ALKPHOS", "BILITOT", "PROT", "ALBUMIN" in the last 168 hours. No results for input(s): "LIPASE", "AMYLASE" in the last 168 hours. No results for input(s): "AMMONIA" in the last 168 hours. CBC: Recent Labs  Lab 03/02/22 0130 03/03/22 0104 03/04/22 0232 03/05/22 0118 03/08/22 0343  WBC 13.9* 15.4* 15.1* 16.2* 15.8*  HGB 8.9* 9.0* 8.7* 8.6* 8.2*  HCT 27.8* 28.3* 27.1* 27.5* 27.1*  MCV 93.3 92.8 93.4 93.2 95.1  PLT 130* 170 175 194 296   Cardiac Enzymes: No results for input(s): "CKTOTAL", "CKMB", "CKMBINDEX", "TROPONINI" in the last 168 hours. BNP: Invalid input(s): "POCBNP" CBG: Recent Labs  Lab 03/07/22 0723 03/07/22 1108 03/07/22 1553 03/07/22 2022 03/08/22 0601  GLUCAP 141* 131* 362* 327* 109*   D-Dimer No results for input(s): "DDIMER" in the last 72 hours. Hgb A1c No results for input(s): "HGBA1C" in the last 72  hours. Lipid Profile No results for input(s): "CHOL", "HDL", "LDLCALC", "TRIG", "CHOLHDL", "LDLDIRECT" in the last 72 hours. Thyroid function studies No results for input(s): "TSH", "T4TOTAL", "T3FREE", "THYROIDAB" in the last 72 hours.  Invalid input(s): "FREET3" Anemia work up No results for input(s): "VITAMINB12", "FOLATE", "FERRITIN", "TIBC", "IRON", "RETICCTPCT" in the last 72 hours. Urinalysis    Component Value Date/Time   COLORURINE YELLOW 11/17/2021 1900   APPEARANCEUR CLEAR 11/17/2021 1900   LABSPEC 1.019 11/17/2021 1900   PHURINE 5.0 11/17/2021 1900   GLUCOSEU 50 (A) 11/17/2021 1900   HGBUR LARGE (A) 11/17/2021 1900   BILIRUBINUR NEGATIVE 11/17/2021 1900   KETONESUR NEGATIVE 11/17/2021 1900   PROTEINUR 100 (A) 11/17/2021 1900   NITRITE NEGATIVE 11/17/2021 1900   LEUKOCYTESUR NEGATIVE 11/17/2021 1900   Sepsis Labs Recent Labs  Lab 03/03/22 0104 03/04/22 0232 03/05/22 0118 03/08/22 0343  WBC 15.4* 15.1* 16.2* 15.8*   Microbiology Recent Results (from the past 240 hour(s))  Culture, blood (x 2)     Status: None   Collection Time: 02/27/22  9:39 AM   Specimen: BLOOD  Result Value Ref Range Status   Specimen Description BLOOD RIGHT ANTECUBITAL  Final   Special Requests   Final    BOTTLES DRAWN AEROBIC AND ANAEROBIC Blood Culture adequate volume   Culture   Final    NO GROWTH 5 DAYS Performed at [facility], [address] [place], [place] [address]    Report Status 03/04/2022 FINAL  Final  Culture, blood (x 2)     Status: None   Collection Time: 02/27/22  9:53 AM   Specimen: BLOOD  Result Value Ref Range Status   Specimen Description BLOOD RIGHT ANTECUBITAL  Final   Special Requests   Final    BOTTLES DRAWN AEROBIC AND ANAEROBIC Blood Culture adequate volume   Culture   Final    NO GROWTH 5 DAYS Performed at [facility], [place] [address], [place], [place] [address]    Report Status 03/04/2022 FINAL  Final  MRSA Next Gen  by PCR, Nasal      Status: None   Collection Time: 02/27/22 12:04 PM   Specimen: Nasal Mucosa; Nasal Swab  Result Value Ref Range Status   MRSA by PCR Next Gen NOT DETECTED NOT DETECTED Final    Comment: (NOTE) The GeneXpert MRSA Assay (FDA approved for NASAL specimens only), is one component of a comprehensive MRSA colonization surveillance program. It is not intended to diagnose MRSA infection nor to guide or monitor treatment for MRSA infections. Test performance is not FDA approved in patients less than 2 years old. Performed at [facility], [address], [place], [place] [address]   Urine Culture     Status: Abnormal   Collection Time: 02/27/22 12:45 PM   Specimen: Urine, Catheterized  Result Value Ref Range Status   Specimen Description URINE, CATHETERIZED  Final   Special Requests   Final    NONE Performed at [facility], [address] [place], [place] [address]    Culture 20,000 COLONIES/mL ENTEROCOCCUS FAECALIS (A)  Final   Report Status 03/01/2022 FINAL  Final   Organism ID, Bacteria ENTEROCOCCUS FAECALIS (A)  Final      Susceptibility   Enterococcus faecalis - MIC*    AMPICILLIN <=2 SENSITIVE Sensitive     NITROFURANTOIN <=16 SENSITIVE Sensitive     VANCOMYCIN 1 SENSITIVE Sensitive     * 20,000 COLONIES/mL ENTEROCOCCUS FAECALIS    Please note: You were cared for by a hospitalist during your hospital stay. Once you are discharged, your primary care physician will handle any further medical issues. Please note that NO REFILLS for any discharge medications will be authorized once you are discharged, as it is imperative that you return to your primary care physician (or establish a relationship with a primary care physician if you do not have one) for your post hospital discharge needs so that they can reassess your need for medications and monitor your lab values.    Time coordinating discharge: 40 minutes  SIGNED:   Burnadette Pop, MD  [facility] 03/08/2022, 10:20 AM Pager [phone]  If 7PM-7AM, please contact night-coverage [email or web address] Password [email or web address]

## 2022-03-08 NOTE — Progress Notes (Signed)
Physical Therapy Treatment Patient Details Name: James Hartman MRN: [id number] DOB: 1976-08-30 Today's Date: 03/08/2022   History of Present Illness The pt is a 46 yo male presenting 6/23 with bilateral leg and foot pain and numbness x3 days. Pt found to have decreased pulses with L foot being cooler than R. S/p L iliac thrombectomy with compartment fasciotomies x4 on 6/23. Hospitalization complicated by RLE DVT and septic shock. PMH includes: HTN, DM II, and morbid obesity.    PT Comments    Pt received in recliner. Completed LE exercises seated and standing with max HR 127 followed by gait trial with max HR 131. He required min assist sit to stand, and min guard assist ambulation 25' with RW, +chair follow. Pt returned to room in recliner. Remained in recliner with feet elevated at end of session.    Recommendations for follow up therapy are one component of a multi-disciplinary discharge planning process, led by the attending physician.  Recommendations may be updated based on patient status, additional functional criteria and insurance authorization.  Follow Up Recommendations  Home health PT     Assistance Recommended at Discharge Frequent or constant Supervision/Assistance  Patient can return home with the following Help with stairs or ramp for entrance;Assist for transportation;Assistance with cooking/housework;A little help with walking and/or transfers   Equipment Recommendations  None recommended by PT    Recommendations for Other Services       Precautions / Restrictions Precautions Precautions: Fall;Other (comment) Precaution Comments: watch HR     Mobility  Bed Mobility               General bed mobility comments: Pt up in chair    Transfers Overall transfer level: Needs assistance Equipment used: Rolling walker (2 wheels) Transfers: Sit to/from Stand Sit to Stand: Min assist           General transfer comment: increased time     Ambulation/Gait Ambulation/Gait assistance: Min guard Gait Distance (Feet): 25 Feet Assistive device: Rolling walker (2 wheels) Gait Pattern/deviations: Step-to pattern, Decreased stride length, Antalgic, Decreased weight shift to left Gait velocity: decreased Gait velocity interpretation: <1.31 ft/sec, indicative of household ambulator   General Gait Details: Heavy reliance on RW. Chair follow for safety. Pt returned to room in recliner. Max HR 131   Stairs Stairs:  (Pt reports family is familiar with taking him in/out of house in w/c.)           Wheelchair Mobility    Modified Rankin (Stroke Patients Only)       Balance Overall balance assessment: Needs assistance Sitting-balance support: Feet supported, No upper extremity supported Sitting balance-Leahy Scale: Good     Standing balance support: Bilateral upper extremity supported, Reliant on assistive device for balance, During functional activity Standing balance-Leahy Scale: Poor                              Cognition Arousal/Alertness: Awake/alert Behavior During Therapy: WFL for tasks assessed/performed Overall Cognitive Status: Within Functional Limits for tasks assessed                                          Exercises General Exercises - Lower Extremity Ankle Circles/Pumps: AROM, Both, 20 reps, Seated Long Arc Quad: AROM, Right, Left, 10 reps, Seated Hip ABduction/ADduction: AROM, Both, 10 reps, Seated Hip Flexion/Marching: AROM,  Right, Left, 10 reps, Seated Heel Raises: Both, 10 reps, Standing (with RW) Mini-Sqauts: AROM, Both, 10 reps, Standing, Other (comment) (with RW)    General Comments General comments (skin integrity, edema, etc.): Max HR 131 during gait. HR up to 127 with standing exercises.      Pertinent Vitals/Pain Pain Assessment Pain Assessment: Faces Faces Pain Scale: Hurts a little bit Pain Location: LLE Pain Descriptors / Indicators: Grimacing,  Discomfort Pain Intervention(s): Monitored during session    Home Living                          Prior Function            PT Goals (current goals can now be found in the care plan section) Acute Rehab PT Goals Patient Stated Goal: home Progress towards PT goals: Progressing toward goals    Frequency    Min 3X/week      PT Plan Current plan remains appropriate    Co-evaluation              AM-PAC PT "6 Clicks" Mobility   Outcome Measure  Help needed turning from your back to your side while in a flat bed without using bedrails?: A Little Help needed moving from lying on your back to sitting on the side of a flat bed without using bedrails?: A Little Help needed moving to and from a bed to a chair (including a wheelchair)?: A Little Help needed standing up from a chair using your arms (e.g., wheelchair or bedside chair)?: A Little Help needed to walk in hospital room?: A Little Help needed climbing 3-5 steps with a railing? : Total 6 Click Score: 16    End of Session Equipment Utilized During Treatment: Gait belt Activity Tolerance: Patient tolerated treatment well Patient left: in chair;with call bell/phone within reach Nurse Communication: Mobility status PT Visit Diagnosis: Unsteadiness on feet (R26.81);Other abnormalities of gait and mobility (R26.89);Difficulty in walking, not elsewhere classified (R26.2);Pain;Muscle weakness (generalized) (M62.81) Pain - Right/Left: Left Pain - part of body: Leg     Time: 8756-4332 PT Time Calculation (min) (ACUTE ONLY): 23 min  Charges:  $Gait Training: 8-22 mins $Therapeutic Exercise: 8-22 mins                     Aida Raider, PT  Office # [phone] Pager [phone]    Ilda Foil 03/08/2022, 9:30 AM

## 2022-03-08 NOTE — TOC Transition Note (Signed)
Transition of Care [facility]) - CM/SW Discharge Note   Patient Details  Name: James Hartman MRN: [id number] Date of Birth: [birth year]  [facility]) CM/SW Contact:  Harriet Masson, RN Phone Number: 03/08/2022, 12:43 PM   Clinical Narrative:    Patient stable for discharge. Notified [facility] of discharge. Family to transport home.  Final next level of care: Home w Home Health Services Barriers to Discharge: Barriers Resolved   Patient Goals and CMS Choice Patient states their goals for this hospitalization and ongoing recovery are:: return home CMS Medicare.gov Compare Post Acute Care list provided to:: Patient Choice offered to / list presented to : Patient  Discharge Placement                       Discharge Plan and Services   Discharge Planning Services: CM Consult Post Acute Care Choice: Home Health          DME Arranged: N/A DME Agency: NA       HH Arranged: PT, OT HH Agency: [facility] Date HH Agency Contacted: 03/03/22 Time HH Agency Contacted: 1020 Representative spoke with at [facility] Agency: Morrie Sheldon  Social Determinants of Health (SDOH) Interventions     Readmission Risk Interventions    03/08/2022   12:41 PM 03/03/2022   11:14 AM  Readmission Risk Prevention Plan  Transportation Screening Complete Complete  PCP or Specialist Appt within 3-5 Days Complete Complete  HRI or Home Care Consult Complete Complete  Social Work Consult for Recovery Care Planning/Counseling Complete Complete  Palliative Care Screening Not Applicable Not Applicable  Medication Review Oceanographer) Complete Complete

## 2022-03-08 NOTE — Progress Notes (Signed)
Occupational Therapy Treatment Patient Details Name: James Hartman MRN: [id number] DOB: 07/31/1976 Today's Date: 03/08/2022  Clinical Impression: Pt was very upbeat, in chair from working with PT. Able to walk to bathroom perform toilet transfers at min A with RW, in room ambulation with RW at min guard. After toileting, able to perform standing grooming at sink with min guard, leans on sink - seated rest break before and after. Excited about going home. Educated on safety in bathroom - fall risk prevention, Pt stating that he has a chair, hand held shower head, and gf will assist him as needed. Excellent support. OT POC remains appropriate at this time.     03/08/22 1000  OT Visit Information  Last OT Received On 03/08/22  Assistance Needed +1  History of Present Illness The pt is a 46 yo male presenting 6/23 with bilateral leg and foot pain and numbness x3 days. Pt found to have decreased pulses with L foot being cooler than R. S/p L iliac thrombectomy with compartment fasciotomies x4 on 6/23. Hospitalization complicated by RLE DVT and septic shock. PMH includes: HTN, DM II, and morbid obesity.  Precautions  Precautions Fall;Other (comment)  Precaution Comments watch HR  Restrictions  Weight Bearing Restrictions No  Pain Assessment  Pain Assessment Faces  Faces Pain Scale 2  Pain Location LLE  Pain Descriptors / Indicators Grimacing;Discomfort  Pain Intervention(s) Monitored during session  Cognition  Arousal/Alertness Awake/alert  Behavior During Therapy WFL for tasks assessed/performed  Overall Cognitive Status Within Functional Limits for tasks assessed  General Comments pt alert and motivated, reinforced education on importance of monitoring HR  ADL  Overall ADL's  Needs assistance/impaired  Grooming Wash/dry hands;Wash/dry face;Oral care;Standing  Grooming Details (indicate cue type and reason) sink level, chair behind for Equities trader Min guard;Ambulation;Rolling  walker (2 wheels)  Toilet Transfer Details (indicate cue type and reason) into bathroom  Tub/Shower Transfer Details (indicate cue type and reason) spoke with pt about tub bench, he has a hand held shower and gf will assist him. he also has long handle sponge  General ADL Comments very cheerful and excited about discharge  Bed Mobility  General bed mobility comments Pt up in chair at beginning and end of session  Transfers  Overall transfer level Needs assistance  Equipment used Rolling walker (2 wheels)  Transfers Sit to/from Stand  Sit to Stand Min assist  General transfer comment increased time  Balance  Overall balance assessment Needs assistance  Sitting-balance support Feet supported;No upper extremity supported  Sitting balance-Leahy Scale Good  Standing balance support Bilateral upper extremity supported;Reliant on assistive device for balance;During functional activity  Standing balance-Leahy Scale Poor  Standing balance comment walker and min guard for static standing  OT Assessment/Plan  OT Plan Discharge plan remains appropriate;Frequency remains appropriate  OT Visit Diagnosis Unsteadiness on feet (R26.81);Other abnormalities of gait and mobility (R26.89);Muscle weakness (generalized) (M62.81);Pain  Pain - part of body Leg  OT Frequency (ACUTE ONLY) Min 2X/week  Recommendations for Other Services PT consult  Follow Up Recommendations Home health OT  Assistance recommended at discharge Frequent or constant Supervision/Assistance  Patient can return home with the following A lot of help with bathing/dressing/bathroom;Assist for transportation;Assistance with cooking/housework;A little help with walking and/or transfers;Help with stairs or ramp for entrance  OT Equipment None recommended by OT  AM-PAC OT "6 Clicks" Daily Activity Outcome Measure (Version 2)  Help from another person eating meals? 4  Help from another person taking care of personal  grooming? 3  Help from  another person toileting, which includes using toliet, bedpan, or urinal? 3  Help from another person bathing (including washing, rinsing, drying)? 2  Help from another person to put on and taking off regular upper body clothing? 3  Help from another person to put on and taking off regular lower body clothing? 2  6 Click Score 17  Progressive Mobility  What is the highest level of mobility based on the progressive mobility assessment? Level 4 (Walks with assist in room) - Balance while marching in place and cannot step forward and back - Complete  Activity Ambulated with assistance in room  OT Goal Progression  Progress towards OT goals Progressing toward goals  Acute Rehab OT Goals  Patient Stated Goal get stronger each day  OT Goal Formulation With patient  Time For Goal Achievement 03/16/22  Potential to Achieve Goals Good  OT Time Calculation  OT Start Time (ACUTE ONLY) 0916  OT Stop Time (ACUTE ONLY) 0940  OT Time Calculation (min) 24 min  OT General Charges  $OT Visit 1 Visit  OT Treatments  $Self Care/Home Management  23-37 mins   Nyoka Cowden OTR/L Acute Rehabilitation Services Office: [phone]

## 2022-03-08 NOTE — Progress Notes (Signed)
Patient given discharge instructions. PIV removed. Telemetry box removed, CCMD notified. TOC and home meds returned to patient.  Kenard Gower, RN

## 2022-03-08 NOTE — Progress Notes (Signed)
Mobility Specialist: Progress Note   03/08/22 1303  Mobility  Activity Ambulated with assistance in hallway  Level of Assistance Minimal assist, patient does 75% or more  Assistive Device Front wheel walker  Distance Ambulated (ft) 102 ft (46'+56')  Activity Response Tolerated well  $Mobility charge 1 Mobility   During Mobility: 114 HR Post-Mobility: 100 HR  Pt received in the chair and agreeable to mobility. MinA to stand from chair. Stopped x1 for seated break secondary to general fatigue. No c/o pain throughout. Pt wheeled back to the room at the end of the session with call bell and phone in reach.   [facility] Zaia Carre Mobility Specialist Mobility Specialist [address]: [phone]

## 2022-03-10 ENCOUNTER — Other Ambulatory Visit (HOSPITAL_COMMUNITY): Payer: Self-pay

## 2022-03-11 ENCOUNTER — Other Ambulatory Visit (HOSPITAL_COMMUNITY): Payer: Self-pay

## 2022-03-23 ENCOUNTER — Encounter: Payer: 59 | Admitting: Vascular Surgery

## 2022-03-23 ENCOUNTER — Inpatient Hospital Stay (HOSPITAL_COMMUNITY): Payer: 59 | Admitting: Hematology

## 2022-06-16 ENCOUNTER — Other Ambulatory Visit (HOSPITAL_COMMUNITY): Payer: Self-pay

## 2022-07-03 ENCOUNTER — Other Ambulatory Visit (HOSPITAL_COMMUNITY): Payer: Self-pay

## 2022-07-21 ENCOUNTER — Other Ambulatory Visit (HOSPITAL_COMMUNITY): Payer: Self-pay

## 2022-07-23 ENCOUNTER — Other Ambulatory Visit (HOSPITAL_COMMUNITY): Payer: Self-pay

## 2022-07-26 ENCOUNTER — Other Ambulatory Visit (HOSPITAL_COMMUNITY): Payer: Self-pay

## 2022-07-28 ENCOUNTER — Other Ambulatory Visit (HOSPITAL_COMMUNITY): Payer: Self-pay

## 2022-07-30 ENCOUNTER — Other Ambulatory Visit (HOSPITAL_COMMUNITY): Payer: Self-pay

## 2022-07-31 ENCOUNTER — Other Ambulatory Visit (HOSPITAL_COMMUNITY): Payer: Self-pay

## 2022-08-02 ENCOUNTER — Other Ambulatory Visit (HOSPITAL_COMMUNITY): Payer: Self-pay

## 2022-08-13 LAB — HISTOPLASMA ANTIGEN, URINE: Histoplasma Antigen, urine: <0.5 (ref <0.5)

## 2022-09-02 ENCOUNTER — Other Ambulatory Visit: Payer: Self-pay

## 2022-09-02 ENCOUNTER — Other Ambulatory Visit (HOSPITAL_COMMUNITY): Payer: Self-pay

## 2022-09-03 ENCOUNTER — Other Ambulatory Visit (HOSPITAL_COMMUNITY): Payer: Self-pay

## 2022-09-13 ENCOUNTER — Other Ambulatory Visit (HOSPITAL_COMMUNITY): Payer: Self-pay

## 2022-09-14 ENCOUNTER — Other Ambulatory Visit (HOSPITAL_COMMUNITY): Payer: Self-pay

## 2022-10-26 ENCOUNTER — Other Ambulatory Visit (HOSPITAL_COMMUNITY): Payer: Self-pay

## 2022-12-30 ENCOUNTER — Encounter: Payer: Self-pay | Admitting: *Deleted
